# Patient Record
Sex: Female | Born: 1955 | Race: White | Hispanic: No | Marital: Married | Smoking: Never smoker
Health system: Southern US, Community
[De-identification: ages and names within clinical notes are randomized; demographics above are authoritative.]

## PROBLEM LIST (undated history)

## (undated) DIAGNOSIS — E119 Type 2 diabetes mellitus without complications: Secondary | ICD-10-CM

## (undated) DIAGNOSIS — M79651 Pain in right thigh: Secondary | ICD-10-CM

## (undated) DIAGNOSIS — E669 Obesity, unspecified: Secondary | ICD-10-CM

## (undated) DIAGNOSIS — M1710 Unilateral primary osteoarthritis, unspecified knee: Secondary | ICD-10-CM

## (undated) DIAGNOSIS — M79659 Pain in unspecified thigh: Secondary | ICD-10-CM

## (undated) DIAGNOSIS — M25579 Pain in unspecified ankle and joints of unspecified foot: Secondary | ICD-10-CM

## (undated) DIAGNOSIS — G709 Myoneural disorder, unspecified: Secondary | ICD-10-CM

## (undated) DIAGNOSIS — M1712 Unilateral primary osteoarthritis, left knee: Secondary | ICD-10-CM

## (undated) HISTORY — PX: JOINT REPLACEMENT: SHX530

## (undated) HISTORY — DX: Type 2 diabetes mellitus without complications: E11.9

## (undated) HISTORY — DX: Myoneural disorder, unspecified: G70.9

## (undated) HISTORY — PX: TONSILLECTOMY: SUR1361

## (undated) HISTORY — DX: Pain in unspecified thigh: M79.659

## (undated) HISTORY — DX: Obesity, unspecified: E66.9

## (undated) HISTORY — DX: Pain in unspecified ankle and joints of unspecified foot: M25.579

## (undated) HISTORY — PX: COLON SURGERY: SHX602

## (undated) HISTORY — PX: CHOLECYSTECTOMY: SHX55

## (undated) HISTORY — DX: Pain in right thigh: M79.651

## (undated) HISTORY — PX: HERNIA REPAIR: SHX51

---

## 1996-05-04 HISTORY — PX: OTHER SURGICAL HISTORY: SHX169

## 1998-01-04 ENCOUNTER — Other Ambulatory Visit: Admission: RE | Admit: 1998-01-04 | Discharge: 1998-01-04 | Payer: Self-pay | Admitting: Internal Medicine

## 1998-01-10 ENCOUNTER — Encounter: Admission: RE | Admit: 1998-01-10 | Discharge: 1998-04-10 | Payer: Self-pay | Admitting: Internal Medicine

## 1998-01-25 ENCOUNTER — Ambulatory Visit (HOSPITAL_COMMUNITY): Admission: RE | Admit: 1998-01-25 | Discharge: 1998-01-25 | Payer: Self-pay

## 1998-05-04 HISTORY — PX: APPENDECTOMY: SHX54

## 1999-10-21 ENCOUNTER — Ambulatory Visit (HOSPITAL_COMMUNITY): Admission: RE | Admit: 1999-10-21 | Discharge: 1999-10-21 | Payer: Self-pay | Admitting: Internal Medicine

## 1999-10-21 ENCOUNTER — Encounter: Payer: Self-pay | Admitting: Internal Medicine

## 2000-11-08 ENCOUNTER — Encounter: Payer: Self-pay | Admitting: Internal Medicine

## 2000-11-08 ENCOUNTER — Ambulatory Visit (HOSPITAL_COMMUNITY): Admission: RE | Admit: 2000-11-08 | Discharge: 2000-11-08 | Payer: Self-pay

## 2001-12-21 ENCOUNTER — Encounter (INDEPENDENT_AMBULATORY_CARE_PROVIDER_SITE_OTHER): Payer: Self-pay | Admitting: Specialist

## 2001-12-22 ENCOUNTER — Inpatient Hospital Stay (HOSPITAL_COMMUNITY): Admission: EM | Admit: 2001-12-22 | Discharge: 2001-12-24 | Payer: Self-pay | Admitting: Surgery

## 2004-05-04 DIAGNOSIS — E119 Type 2 diabetes mellitus without complications: Secondary | ICD-10-CM

## 2004-05-04 HISTORY — DX: Type 2 diabetes mellitus without complications: E11.9

## 2004-07-21 ENCOUNTER — Ambulatory Visit (HOSPITAL_COMMUNITY): Admission: RE | Admit: 2004-07-21 | Discharge: 2004-07-21 | Payer: Self-pay | Admitting: Internal Medicine

## 2007-09-21 ENCOUNTER — Other Ambulatory Visit: Admission: RE | Admit: 2007-09-21 | Discharge: 2007-09-21 | Payer: Self-pay | Admitting: Family Medicine

## 2007-09-30 ENCOUNTER — Ambulatory Visit (HOSPITAL_COMMUNITY): Admission: RE | Admit: 2007-09-30 | Discharge: 2007-09-30 | Payer: Self-pay | Admitting: Family Medicine

## 2007-10-05 ENCOUNTER — Encounter: Admission: RE | Admit: 2007-10-05 | Discharge: 2007-10-05 | Payer: Self-pay | Admitting: Family Medicine

## 2008-11-20 ENCOUNTER — Ambulatory Visit (HOSPITAL_COMMUNITY): Admission: RE | Admit: 2008-11-20 | Discharge: 2008-11-20 | Payer: Self-pay | Admitting: Family Medicine

## 2009-12-24 ENCOUNTER — Ambulatory Visit (HOSPITAL_COMMUNITY): Admission: RE | Admit: 2009-12-24 | Discharge: 2009-12-24 | Payer: Self-pay | Admitting: Internal Medicine

## 2010-03-18 ENCOUNTER — Ambulatory Visit: Payer: Self-pay | Admitting: Family Medicine

## 2010-03-18 ENCOUNTER — Other Ambulatory Visit: Admission: RE | Admit: 2010-03-18 | Discharge: 2010-03-18 | Payer: Self-pay | Admitting: Family Medicine

## 2010-03-18 DIAGNOSIS — M25579 Pain in unspecified ankle and joints of unspecified foot: Secondary | ICD-10-CM

## 2010-03-18 DIAGNOSIS — J209 Acute bronchitis, unspecified: Secondary | ICD-10-CM

## 2010-03-18 DIAGNOSIS — F3289 Other specified depressive episodes: Secondary | ICD-10-CM | POA: Insufficient documentation

## 2010-03-18 DIAGNOSIS — F329 Major depressive disorder, single episode, unspecified: Secondary | ICD-10-CM

## 2010-03-18 LAB — HM PAP SMEAR

## 2010-03-18 LAB — HM DIABETES FOOT EXAM

## 2010-03-19 ENCOUNTER — Encounter: Payer: Self-pay | Admitting: Family Medicine

## 2010-03-19 LAB — CONVERTED CEMR LAB: Microalb, Ur: 1.37 mg/dL (ref 0.00–1.89)

## 2010-03-20 ENCOUNTER — Ambulatory Visit (HOSPITAL_COMMUNITY): Admission: RE | Admit: 2010-03-20 | Discharge: 2010-03-20 | Payer: Self-pay | Admitting: Family Medicine

## 2010-03-20 DIAGNOSIS — R17 Unspecified jaundice: Secondary | ICD-10-CM | POA: Insufficient documentation

## 2010-03-20 LAB — CONVERTED CEMR LAB
Alkaline Phosphatase: 54 units/L (ref 39–117)
BUN: 10 mg/dL (ref 6–23)
Bilirubin, Direct: 0.4 mg/dL — ABNORMAL HIGH (ref 0.0–0.3)
Cholesterol: 136 mg/dL (ref 0–200)
Creatinine, Ser: 0.72 mg/dL (ref 0.40–1.20)
Eosinophils Absolute: 0.1 10*3/uL (ref 0.0–0.7)
Glucose, Bld: 97 mg/dL (ref 70–99)
HCT: 37.2 % (ref 36.0–46.0)
Hemoglobin: 12.4 g/dL (ref 12.0–15.0)
Hgb A1c MFr Bld: 6.8 % — ABNORMAL HIGH (ref <5.7)
Indirect Bilirubin: 1.5 mg/dL — ABNORMAL HIGH (ref 0.0–0.9)
LDL Cholesterol: 60 mg/dL (ref 0–99)
Lymphs Abs: 2.1 10*3/uL (ref 0.7–4.0)
MCHC: 33.3 g/dL (ref 30.0–36.0)
MCV: 89.9 fL (ref 78.0–100.0)
Monocytes Absolute: 0.4 10*3/uL (ref 0.1–1.0)
Monocytes Relative: 5 % (ref 3–12)
Neutrophils Relative %: 62 % (ref 43–77)
Potassium: 3.7 meq/L (ref 3.5–5.3)
RBC: 4.14 M/uL (ref 3.87–5.11)
TSH: 0.951 microintl units/mL (ref 0.350–4.500)
VLDL: 22 mg/dL (ref 0–40)
Vit D, 25-Hydroxy: 50 ng/mL (ref 30–89)
WBC: 6.9 10*3/uL (ref 4.0–10.5)

## 2010-03-21 ENCOUNTER — Telehealth (INDEPENDENT_AMBULATORY_CARE_PROVIDER_SITE_OTHER): Payer: Self-pay | Admitting: *Deleted

## 2010-03-25 ENCOUNTER — Encounter: Payer: Self-pay | Admitting: Family Medicine

## 2010-03-28 DIAGNOSIS — E1159 Type 2 diabetes mellitus with other circulatory complications: Secondary | ICD-10-CM | POA: Insufficient documentation

## 2010-05-25 ENCOUNTER — Encounter: Payer: Self-pay | Admitting: Internal Medicine

## 2010-05-27 ENCOUNTER — Ambulatory Visit
Admission: RE | Admit: 2010-05-27 | Discharge: 2010-05-27 | Payer: Self-pay | Source: Home / Self Care | Attending: Internal Medicine | Admitting: Internal Medicine

## 2010-06-05 NOTE — Assessment & Plan Note (Signed)
Summary: new pt   Vital Signs:  Patient profile:   55 year old female Height:      68 inches Weight:      246.25 pounds BMI:     37.58 O2 Sat:      98 % on Room air Pulse rate:   66 / minute Pulse rhythm:   regular Resp:     16 per minute BP sitting:   120 / 82  (left arm)  Vitals Entered By: Mauricia Area CMA (March 18, 2010 1:50 PM)  Nutrition Counseling: Patient's BMI is greater than 25 and therefore counseled on weight management options.  O2 Flow:  Room air CC: Pain, burning in ankles  Vision Screening:Left eye with correction: 20 / 20 Right eye with correction: 20 / 20 Both eyes with correction: 20 / 15        Vision Entered By: Mauricia Area CMA (March 18, 2010 2:06 PM)   Primary Care Provider:  Syliva Overman MD  CC:  Pain and burning in ankles.  History of Present Illness: New pt to this  office to establish care. she has concerns about her weight, which she has succesfully reduced in the past several months. She is diabetic and reports good control.S he denies polyuria, polydypsia, blurred vision or hypoglycemic episodes. She recently lost her mother, who was her "best friend' and is having difficulty with this. along with a challenging work environment she reports depression.she is not suicidal or homicidal , but has multiple vegetative symptoms. She also has concern about spider veinsin the legs, but wants to hold on a vascular consult at this time , and intends to lose more weight.   Preventive Screening-Counseling & Management  Alcohol-Tobacco     Smoking Status: never  Caffeine-Diet-Exercise     Does Patient Exercise: no      Drug Use:  no.    Current Medications (verified): 1)  Vytorin 10-20 Mg Tabs (Ezetimibe-Simvastatin) .Marland Kitchen.. 1 Tab Three Times A Week 2)  Vitamin D 2000 Iu .Marland Kitchen.. 1 Tab Daily 3)  Ibuprofen 800 Mg .... As Needed  Allergies (verified): No Known Drug Allergies  Past History:  Past Medical History: type 2 dm since  2006 obesity all her life, lost 28 pounds in 14 weeks bilateral anklle pain x 3 months. bilateral thigh pain, worse in the right , x 52yr, esp when lying down, worse in the morning, and goes away with activity depressed due to loss of mother had a fib had massive stroke , died 4 days after, she was her best friend  Past Surgical History: cholecysectomy approx 1998 appendectomy 2000 tonsillectomy in childhood obesity  Family History: Family History of Colon CA 1st degree relative <60, grandfather in his 10'6 Family History Diabetes 1st degree relative, maternal grandmother Mom deceased at age 45, CVA secondary to afib Father aged 47,HTN, hyperlipidemia, still employed siblling 1 sister, 3 y/o in 2011 , varicose veins  Social History: Married x 35 yrs Never Smoked Alcohol use-no Drug use-no Regular exercise-no Two children ages 45 and 83 Smoking Status:  never Drug Use:  no Does Patient Exercise:  no  Review of Systems      See HPI General:  Complains of fatigue, malaise, and weight loss. Eyes:  Denies blurring, discharge, eye pain, and red eye. ENT:  Denies earache, hoarseness, nasal congestion, sinus pressure, and sore throat. CV:  Denies chest pain or discomfort, difficulty breathing while lying down, palpitations, shortness of breath with exertion, and swelling of feet. Resp:  Complains of cough, shortness of breath, and sputum productive; denies wheezing; 4 day history. GI:  Denies abdominal pain, change in bowel habits, constipation, diarrhea, indigestion, loss of appetite, nausea, and vomiting. GU:  Denies dysuria and urinary frequency. MS:  Denies joint pain, low back pain, mid back pain, muscle aches, muscle weakness, and stiffness. Derm:  Denies itching, lesion(s), and rash. Neuro:  Denies falling down, headaches, memory loss, numbness, poor balance, seizures, sensation of room spinning, tingling, and tremors. Psych:  Complains of anxiety, depression, and easily  tearful; denies mental problems, suicidal thoughts/plans, thoughts of violence, unusual visions or sounds, and thoughts /plans of harming others. Endo:  Denies cold intolerance, excessive thirst, excessive urination, and heat intolerance. Heme:  Denies abnormal bruising, bleeding, and enlarge lymph nodes. Allergy:  Denies hives or rash and itching eyes.  Physical Exam  General:  Well-developed,obese,in no acute distress; alert,appropriate and cooperative throughout examination Head:  Normocephalic and atraumatic without obvious abnormalities. No apparent alopecia or balding. Eyes:  No corneal or conjunctival inflammation noted. EOMI. Perrla. Funduscopic exam benign, without hemorrhages, exudates or papilledema. Vision grossly normal. Ears:  External ear exam shows no significant lesions or deformities.  Otoscopic examination reveals clear canals, tympanic membranes are intact bilaterally without bulging, retraction, inflammation or discharge. Hearing is grossly normal bilaterally. Nose:  External nasal examination shows no deformity or inflammation. Nasal mucosa are pink and moist without lesions or exudates. Mouth:  Oral mucosa and oropharynx without lesions or exudates.  Teeth in good repair. Neck:  No deformities, masses, or tenderness noted. Chest Wall:  No deformities, masses, or tenderness noted. Breasts:  No mass, nodules, thickening, tenderness, bulging, retraction, inflamation, nipple discharge or skin changes noted.   Lungs:  normal respiratory effort.  Adequate air entry, bilateral crackles, no wheezes Heart:  Normal rate and regular rhythm. S1 and S2 normal without gallop, murmur, click, rub or other extra sounds. Abdomen:  Bowel sounds positive,abdomen soft and non-tender without masses, organomegaly or hernias noted.Moderately obese Rectal:  No external abnormalities noted. Normal sphincter tone. No rectal masses or tenderness. Genitalia:  Normal introitus for age, no external  lesions, no vaginal discharge, mucosa pink and moist, no vaginal or cervical lesions, no vaginal atrophy, no friaility or hemorrhage, normal uterus size and position, no adnexal masses or tenderness Msk:  No deformity or scoliosis noted of thoracic or lumbar spine.   Pulses:  R and L carotid,radial,femoral,dorsalis pedis and posterior tibial pulses are full and equal bilaterally Extremities:  No clubbing, cyanosis, edema, or deformity noted with normal full range of motion of all joints.   Neurologic:  No cranial nerve deficits noted. Station and gait are normal. Plantar reflexes are down-going bilaterally. DTRs are symmetrical throughout. Sensory, motor and coordinative functions appear intact. Skin:  Intact without suspicious lesions or rashes Cervical Nodes:  No lymphadenopathy noted Axillary Nodes:  No palpable lymphadenopathy Inguinal Nodes:  No significant adenopathy Psych:  Oriented X3 and memory intact for recent and remote.  tearful and depressed  Diabetes Management Exam:    Foot Exam (with socks and/or shoes not present):       Sensory-Monofilament:          Left foot: normal          Right foot: normal       Inspection:          Left foot: normal          Right foot: normal       Nails:  Left foot: normal          Right foot: normal   Impression & Recommendations:  Problem # 1:  DIABETES MELLITUS, TYPE II (ICD-250.00) Assessment Comment Only Patient advised to reduce carbs and sweets, commit to regular physical activity, take meds as prescribed, test blood sugars as directed, and attempt to lose weight , to improve blood sugar control. 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 Take 1 tablet by  mouth three times a day    Tessalon Perles 100 Mg Caps (Benzonatate) .Marland Kitchen... Take 1 capsule by mouth three times a day  Orders: CXR- 2view (CXR)  Problem # 4:  PHYSICAL EXAMINATION (ICD-V70.0) Assessment: Comment Only  Orders: EKG w/ Interpretation (93000)nSR, no ischemic chnages T-Basic Metabolic Panel [id number]) T-Lipid Profile [id number]) T-CBC w/Diff (84696-29528) T-Hepatic Function (41324-40102) T-TSH (72536-64403)  Problem # 5:  DEPRESSION (ICD-311) Assessment: Deteriorated you are being referred for therapy and will start medication for depression  Complete Medication List: 1)  Vytorin 10-20 Mg Tabs (Ezetimibe-simvastatin) .Marland Kitchen.. 1 tab three times a week 2)  Vitamin D 2000 Iu  .Marland Kitchen.. 1 tab daily 3)  Ibuprofen 800 Mg  .... As needed 4)  Fluoxetine Hcl 10 Mg Caps (Fluoxetine hcl) .... Take 1 capsule by mouth once a day 5)  Penicillin V Potassium 500 Mg Tabs (Penicillin v potassium) .... Take 1 tablet by mouth three times a day 6)  Tessalon Perles 100 Mg Caps (Benzonatate) .... Take 1 capsule by mouth three times a day 7)  Fluconazole 150 Mg Tabs (Fluconazole) .... Take 1 tablet by mouth once a day 8)  Actoplus Met 15-850 Mg Tabs (Pioglitazone hcl-metformin hcl) .... Take 1 tablet by mouth once a day 9)  Vitamin D 200iu  .... One daily  Other Orders: T- Hemoglobin A1C (47425-95638) T-Uric Acid (Blood) (75643-32951) T-FSH (88416-60630) T-Urine Microalbumin w/creat. ratio ([id number]) Psychology Referral (Psychology) Pap Smear (20254) Hemoccult Guaiac-1 spec.(in office) (82270) Tdap => 49yrs IM (27062) Admin 1st Vaccine (37628) T-Vitamin D (25-Hydroxy) (31517-61607)  Patient Instructions: 1)  Please schedule a follow-up appointment in 2 months. 2)  It is important that you exercise regularly at least 20 minutes 5 times a week. If you develop chest pain, have severe difficulty breathing, or feel very tired , stop exercising immediately and seek medical  attention. 3)  You need to lose weight. Consider a lower calorie diet and regular exercise.  4)  medication is sent in for acute bronchitis. 5)  BMP prior to visit, ICD-9: 6)  Hepatic Panel prior to visit, ICD-9: 7)  Lipid Panel prior to visit, ICD-9: 8)  TSH prior to visit, ICD-9: 9)  CBC w/ Diff prior to visit, ICD-9: 10)  HbgA1C prior to visit, ICD-9: 11)  vit D today. 12)  you will get TDap  today. 13)  you need a CXr. 14)  you are referred to therapy and to gI for a colonscopy 15)  Urine Microalbumin prior to visit Prescriptions: FLUCONAZOLE 150 MG TABS (FLUCONAZOLE) Take 1 tablet by mouth once a day  #3 x 0   Entered and Authorized by:   Syliva Overman MD   Signed by:   Syliva Overman MD on 03/18/2010   Method used:   Electronically to  [facility] (retail)       [address]       [place], [place]  [address]       Ph: [phone]       Fax: [phone]   RxID:   [id number] TESSALON PERLES 100 MG CAPS (BENZONATATE) Take 1 capsule by mouth three times a day  #30 x 0   Entered and Authorized by:   Syliva Overman MD   Signed by:   Syliva Overman MD on 03/18/2010   Method used:   Electronically to        [facility] (retail)       [address]       [place], [place]  [address]       Ph: [phone]       Fax: [phone]   RxID:   [id number] PENICILLIN V POTASSIUM 500 MG TABS (PENICILLIN V POTASSIUM) Take 1 tablet by mouth three times a day  #30 x 0   Entered and Authorized by:   Syliva Overman MD   Signed by:   Syliva Overman MD on 03/18/2010   Method used:   Electronically to        [facility] (retail)       [address]       [place], [place]  [address]       Ph: [phone]       Fax: [phone]   RxID:   [id number] FLUOXETINE HCL 10 MG CAPS (FLUOXETINE HCL) Take 1 capsule by mouth once a day  #30 x 3   Entered  and Authorized by:   Syliva Overman MD   Signed by:   Syliva Overman MD on 03/18/2010   Method used:   Electronically to        [facility] (retail)       [address]       [place], [place]  [address]       Ph: [phone]       Fax: [phone]   RxID:   [id number]    Orders Added: 1)  CXR- 2view [CXR] 2)  New Patient 40-64 years [99386] 3)  EKG w/ Interpretation [93000] 4)  T-Basic Metabolic Panel [80048-22910] 5)  T-Lipid Profile [80061-22930] 6)  T-CBC w/Diff [78242-35361] 7)  T-Hepatic Function [80076-22960] 8)  T- Hemoglobin A1C [83036-23375] 9)  T-TSH [44315-40086] 10)  T-Uric Acid (Blood) [84550-23180] 11)  T-FSH [83001-23670] 12)  T-Urine Microalbumin w/creat. ratio [82043-[address]] 13)  Psychology Referral [Psychology] 14)  Pap Smear [88150] 15)  Hemoccult Guaiac-1 spec.(in office) [82270] 16)  Tdap => 22yrs IM [90715] 17)  Admin 1st Vaccine [90471] 18)  T-Vitamin D (25-Hydroxy) [76195-09326] 19)  Gastroenterology Referral [GI] 20)  Gastroenterology Referral [GI]   Immunizations Administered:  Tetanus Vaccine:    Vaccine Type: Tdap    Site: right deltoid    Mfr: GlaxoSmithKline    Dose: 0.5 ml    Route: IM    Given by: Adella Hare LPN    Exp. Date: 02/21/2012    Lot #: [id number]    Immunizations Administered:  Tetanus Vaccine:    Vaccine Type: Tdap    Site: right deltoid    Mfr: GlaxoSmithKline    Dose: 0.5 ml    Route: IM    Given by: Adella Hare LPN  Exp. Date: 02/21/2012    Lot #: [id number]   Laboratory Results  Date/Time Received: March 18, 2010 3:17 PM  Date/Time Reported: March 18, 2010 3:17 PM   Stool - Occult Blood Hemmoccult #1: positive Date: 03/18/2010 Comments: 50201 10L 02/13 118/10/12 Adella Hare LPN  March 18, 2010 3:19 PM

## 2010-06-05 NOTE — Progress Notes (Signed)
Summary: APPT WITH [facility]  Phone Note Call from Patient   Summary of Call: HAS APPT WITH DR [facility] 12.27.11 @ 3:00  PATIENT IS AWARE Initial call taken by: Lind Guest,  March 21, 2010 9:52 AM

## 2010-06-05 NOTE — Letter (Signed)
[facility]   Imported By: Lind Guest 03/19/2010 07:52:08  _____________________________________________________________________  External Attachment:    Type:   Image     Comment:   External Document

## 2010-06-05 NOTE — Letter (Signed)
Summary: Pap Smear, Normal Letter, [facility]  [address]   [place], [place] [address]   Phone: [phone]  Fax: [phone]          March 25, 2010    Dear: Kelly Savage [facility]    I am pleased to notify you that your PAP smear was normal.  You will need your next PAP smear in:     ____ 3 Months    ____ 6 Months    ____ 12 Months    Please call the office at our office number above, to schedule your next appointment.    Sincerely,    Adella Hare, LPN [facility]

## 2010-06-09 ENCOUNTER — Ambulatory Visit: Payer: Self-pay | Admitting: Family Medicine

## 2010-06-25 ENCOUNTER — Ambulatory Visit: Payer: Self-pay | Admitting: Family Medicine

## 2010-07-16 ENCOUNTER — Other Ambulatory Visit (INDEPENDENT_AMBULATORY_CARE_PROVIDER_SITE_OTHER): Payer: Self-pay | Admitting: Internal Medicine

## 2010-07-16 ENCOUNTER — Encounter (HOSPITAL_BASED_OUTPATIENT_CLINIC_OR_DEPARTMENT_OTHER): Payer: BC Managed Care – PPO | Admitting: Internal Medicine

## 2010-07-16 ENCOUNTER — Ambulatory Visit (HOSPITAL_COMMUNITY)
Admission: RE | Admit: 2010-07-16 | Discharge: 2010-07-16 | Disposition: A | Discharge status: Home / Self Care | Payer: BC Managed Care – PPO | Source: Ambulatory Visit | Attending: Internal Medicine | Admitting: Internal Medicine

## 2010-07-16 DIAGNOSIS — K6389 Other specified diseases of intestine: Secondary | ICD-10-CM

## 2010-07-16 DIAGNOSIS — K921 Melena: Secondary | ICD-10-CM | POA: Insufficient documentation

## 2010-07-16 DIAGNOSIS — Z7982 Long term (current) use of aspirin: Secondary | ICD-10-CM | POA: Insufficient documentation

## 2010-07-16 DIAGNOSIS — R198 Other specified symptoms and signs involving the digestive system and abdomen: Secondary | ICD-10-CM

## 2010-07-16 DIAGNOSIS — E119 Type 2 diabetes mellitus without complications: Secondary | ICD-10-CM | POA: Insufficient documentation

## 2010-07-16 DIAGNOSIS — K5730 Diverticulosis of large intestine without perforation or abscess without bleeding: Secondary | ICD-10-CM

## 2010-07-16 DIAGNOSIS — R195 Other fecal abnormalities: Secondary | ICD-10-CM

## 2010-07-21 ENCOUNTER — Encounter: Payer: Self-pay | Admitting: Family Medicine

## 2010-07-22 ENCOUNTER — Encounter: Payer: Self-pay | Admitting: Family Medicine

## 2010-07-22 ENCOUNTER — Ambulatory Visit (INDEPENDENT_AMBULATORY_CARE_PROVIDER_SITE_OTHER): Payer: BC Managed Care – PPO | Admitting: Family Medicine

## 2010-07-22 DIAGNOSIS — E785 Hyperlipidemia, unspecified: Secondary | ICD-10-CM

## 2010-07-22 DIAGNOSIS — R5381 Other malaise: Secondary | ICD-10-CM

## 2010-07-22 DIAGNOSIS — R7301 Impaired fasting glucose: Secondary | ICD-10-CM

## 2010-07-22 DIAGNOSIS — M899 Disorder of bone, unspecified: Secondary | ICD-10-CM

## 2010-07-22 DIAGNOSIS — R5383 Other fatigue: Secondary | ICD-10-CM

## 2010-07-22 DIAGNOSIS — E669 Obesity, unspecified: Secondary | ICD-10-CM

## 2010-07-22 DIAGNOSIS — M25569 Pain in unspecified knee: Secondary | ICD-10-CM

## 2010-07-22 DIAGNOSIS — M949 Disorder of cartilage, unspecified: Secondary | ICD-10-CM

## 2010-07-22 DIAGNOSIS — Z13220 Encounter for screening for lipoid disorders: Secondary | ICD-10-CM

## 2010-07-22 MED ORDER — HYDROCODONE-ACETAMINOPHEN 5-500 MG PO TABS: 1.0000 | ORAL_TABLET | Freq: Four times a day (QID) | ORAL | Status: AC | PRN

## 2010-07-22 MED ORDER — PIOGLITAZONE HCL-METFORMIN HCL 15-850 MG PO TABS: 1.0000 | ORAL_TABLET | Freq: Every day | ORAL | Status: DC

## 2010-07-22 NOTE — Patient Instructions (Addendum)
F/u in 4 months.  Med changes as discussed.  Fasting lipid, hepatic and chem 7 and hBA1c and Vit d as soon as possible, also same labs to be repeated , except vit d in 4 months

## 2010-07-25 ENCOUNTER — Encounter: Payer: Self-pay | Admitting: Family Medicine

## 2010-07-25 DIAGNOSIS — E66811 Obesity, class 1: Secondary | ICD-10-CM | POA: Insufficient documentation

## 2010-07-25 DIAGNOSIS — E669 Obesity, unspecified: Secondary | ICD-10-CM | POA: Insufficient documentation

## 2010-07-25 DIAGNOSIS — M170 Bilateral primary osteoarthritis of knee: Secondary | ICD-10-CM | POA: Insufficient documentation

## 2010-07-25 NOTE — Progress Notes (Signed)
  Subjective:    Patient ID: Kelly Savage, female    DOB: Aug 18, 1955, 55 y.o.   MRN: [id number]  HPI Patient reports that she is doing  well Denies recent fever or chills. Denies sinus pressure, nasal congestion, ear pain or sore throat. Denies chest congestion, productive cough or wheezing. Denies chest pains, palpitations, paroxysmal nocturnal dyspnea, orthopnea and leg swelling Denies abdominal pain, nausea, vomiting,diarrhea or constipation.  Denies rectal bleeding or change in bowel movement. Denies dysuria, frequency, hesitancy or incontinence. Denies joint pain, swelling and limitation and mobility. Denies headaches, seizure, numbness, or tingling. Denies depression, anxiety or insomnia. Denies skin break down or rash. Blood sugars when tested are seldom over 120, and she denies polyuria, excessive thirst or  blurred vision. Since her visit , she has had a colonoscopy, which was normal, and she reports excellent repsponse to the fluoxetine , which she plans to continue. She does want to be more diligent with diet and exercise.        Review of Systems     Objective:   Physical Exam  Constitutional: She is oriented to person, place, and time. She appears well-developed and well-nourished.  HENT:  Head: Normocephalic.  Right Ear: External ear normal.  Left Ear: External ear normal.  Nose: Nose normal.  Mouth/Throat: Oropharynx is clear and moist.  Eyes: Conjunctivae and EOM are normal. Right eye exhibits no discharge. Left eye exhibits no discharge. No scleral icterus.  Neck: Normal range of motion. Neck supple. No JVD present. No tracheal deviation present. No thyromegaly present.  Cardiovascular: Normal rate, regular rhythm and intact distal pulses.   No murmur heard. Pulmonary/Chest: Effort normal and breath sounds normal. No respiratory distress. She has no wheezes. She has no rales.  Abdominal: Soft. Bowel sounds are normal. There is no tenderness.    Musculoskeletal: Normal range of motion. She exhibits no edema.  Lymphadenopathy:    She has no cervical adenopathy.  Neurological: She is alert and oriented to person, place, and time. No cranial nerve deficit. Coordination normal.  Skin: Skin is warm. No rash noted. No erythema.  Psychiatric: She has a normal mood and affect. Her behavior is normal. Judgment and thought content normal.          Assessment & Plan:  Diabetes: Improved , no med changes. Low carb diet rich in vegetables discussed, also the value of regular exercise. Labs due , and prior to next OV  Obesity: unchanged , increased activity encouraged.  Hyperlipidemia: low fat diet discussed, LDL goal of 70 addressed, no med change.  Arthritis knees: unchanged, Vicodin as needed in limited supply

## 2010-08-01 ENCOUNTER — Other Ambulatory Visit: Payer: Self-pay | Admitting: Family Medicine

## 2010-08-01 LAB — BASIC METABOLIC PANEL
BUN: 21 mg/dL (ref 6–23)
CO2: 28 mEq/L (ref 19–32)
Chloride: 102 mEq/L (ref 96–112)
Glucose, Bld: 104 mg/dL — ABNORMAL HIGH (ref 70–99)
Potassium: 4.3 mEq/L (ref 3.5–5.3)

## 2010-08-01 LAB — HEPATIC FUNCTION PANEL
Bilirubin, Direct: 0.2 mg/dL (ref 0.0–0.3)
Indirect Bilirubin: 1 mg/dL — ABNORMAL HIGH (ref 0.0–0.9)
Total Bilirubin: 1.2 mg/dL (ref 0.3–1.2)
Total Protein: 6.6 g/dL (ref 6.0–8.3)

## 2010-08-01 LAB — LIPID PANEL
LDL Cholesterol: 97 mg/dL (ref 0–99)
VLDL: 13 mg/dL (ref 0–40)

## 2010-08-01 LAB — HEMOGLOBIN A1C: Mean Plasma Glucose: 134 mg/dL — ABNORMAL HIGH (ref <117)

## 2010-08-02 LAB — VITAMIN D 25 HYDROXY (VIT D DEFICIENCY, FRACTURES): Vit D, 25-Hydroxy: 35 ng/mL (ref 30–89)

## 2010-08-04 ENCOUNTER — Telehealth: Payer: Self-pay | Admitting: *Deleted

## 2010-08-04 NOTE — Telephone Encounter (Signed)
Message copied by Adella Hare on Mon Aug 04, 2010  9:12 AM ------      Message from: Syliva Overman      Created: Sun Aug 03, 2010 12:58 PM       pls advise blood sugar, vit D liver and kidney tests are all good.LlDL is too high, increase the vytorin to 5 days per week      Will need rept lipid and hepatic added to next labs if not already there pls, let her know fasting next lab draw

## 2010-08-05 NOTE — Op Note (Signed)
  NAMEJAMILET, Kelly Savage                 ACCOUNT NO.:  [id number]  MEDICAL RECORD NO.:  [id number]           PATIENT TYPE:  O  LOCATION:  [facility]                          FACILITY:  [facility]  PHYSICIAN:  Lionel December, M.D.    DATE OF BIRTH:  [birth year]  DATE OF PROCEDURE:  07/16/2010 DATE OF DISCHARGE:                              OPERATIVE REPORT   PROCEDURE:  Colonoscopy with terminal ileoscopy.  INDICATION:  Kelly Savage is 55 year old Caucasian female who was noted to have heme-positive stools.  She does not have chronic diarrhea, frank bleeding.  Family history is negative for colorectal carcinoma. Procedure risks were reviewed with the patient and informed consent was obtained.  MEDICATIONS FOR CONSCIOUS SEDATION:  Demerol 50 mg IV, Versed 8 mg IV.  FINDINGS:  Procedure performed in endoscopy suite.  The patient's vital signs and O2 sat were monitored during the procedure and remained stable.  The patient was placed in the left lateral recumbent position and rectal examination was performed.  No abnormality noted on external or digital exam.  Pentax videoscope was placed in the rectum and advanced under vision into sigmoid colon and beyond.  Preparation was excellent.  She had few scattered diverticula in sigmoid and descending colon.  Somewhat redundant hepatic flexure.  The scope was finally passed into cecum which was identified by appendiceal stump and ileocecal valve.  Short segment GI was also examined and was normal. There were 3 small erosions at cecum and a couple more distally to it, in the region of hepatic flexure, one at splenic flexure.  Biopsy was taken from 2 cecal erosions, other one could not be found on the way out.  No polyps and/or tumor masses were noted.  Rectal mucosa was normal.  Scope was retroflexed to examine anorectal junction and hemorrhoids noted below the dentate line along with thickening to anoderm.  Endoscope was straightened and withdrawn.  Withdrawal  time was 11 minutes.  The patient tolerated the procedure well.  FINAL DIAGNOSIS:  Normal terminal ileum.  Few diverticula at sigmoid and descending colon.  Few scattered erosions.  Biopsy was taken from cecum.  I suspect these erosions may be secondary to ASA or ibuprofen that she uses sporadically.  RECOMMENDATIONS:  Standard instructions given.  High-fiber diet.  I will be contacting the patient with results of biopsy and further recommendations if any.     Lionel December, M.D.     NR/MEDQ  D:  07/16/2010  T:  07/16/2010  Job:  [id number]  cc:   Milus Mallick. Lodema Hong, M.D. Fax: [phone]  Electronically Signed by Lionel December M.D. on 08/05/2010 09:43:25 AM

## 2010-09-19 NOTE — Op Note (Signed)
Kelly Savage, Kelly Savage                          ACCOUNT NO.:  [id number]   MEDICAL RECORD NO.:  [id number]                   PATIENT TYPE:  EMS   LOCATION:  ED                                   FACILITY:  [facility]   PHYSICIAN:  Currie Paris, M.D.           DATE OF BIRTH:  1955/09/04   DATE OF PROCEDURE:  12/21/2001  DATE OF DISCHARGE:                                 OPERATIVE REPORT   PREOPERATIVE DIAGNOSIS:  Retrocecal appendicitis.   POSTOPERATIVE DIAGNOSIS:  Acute perforated retrocecal appendicitis.   OPERATION:  Laparoscopic and open appendectomy.   SURGEON:  Currie Paris, M.D.   ANESTHESIA:  General endotracheal.   CLINICAL HISTORY:  This patient is a 55 year old woman, who has presented  with about a 12 hour history of abdominal pain.  It began diffusely and  localized into her right lower quadrant with associated nausea, vomiting,  diarrhea.  She had right lower quadrant tenderness both direct and rebound.  White count was 15,000, and CT scan showed a very enlarged appendix with a  fair amount of inflammation around it but no clear cut abscess.  It appeared  to be somewhat retrocecal or laterally located, and that was consistent with  the level and location of her pain.   DESCRIPTION OF PROCEDURE:  The patient was seen in the holding area again,  and she had no further questions.  She was taken to the operating room and  after satisfactory general endotracheal anesthesia had been obtained, a  Foley catheter was placed and the abdomen prepped and draped.  I used 0.25%  Marcaine for the laparoscopic incisions and made an umbilical incision and  opened the fascia and entered the peritoneal cavity.  Hasson was introduced  and the abdomen insufflated to 15.  I could not really visualize the right  lower quadrant yet, so I put a trocar in the right upper quadrant and a 10-  11 in the left lower quadrant, both under direct vision.  At this point, I  was able to see  what appeared to be the very tip of the appendix which  appeared to be necrotic.  That was the only portion of the appendix that was  visible and after a lot of effort and using some blunt and sharp dissection  and a harmonic scalpel to try to free up the retroperitoneum laterally to  get this mobilized out, I abandoned the laparoscopic approach, thinking that  this was not going to work because things were too stuck and the  inflammatory process too involved to successfully do laparoscopically.  Those trocars were removed.  The patient's right lower quadrant was opened  with a transverse incision down through the fascia, the muscle layers split,  and the peritoneal cavity entered.  With retractors in place, I was then  able to mobilize the appendix up, and it actually shredded as I tried to  track up  on it, and the distal end came off.  I was then able to mobilize  about two-thirds of the distal appendix back towards the base and then  finally, this gave me enough mobility of the cecum to get it out into the  wound.  The base of the appendix actually then was visualized and looked  pretty normal.  I was able to get around that, divide it, ligate the  proximal end with an 0 chromic and gird it with a pursestring.  With the  appendix thus divided, I was then able to finally divide the remaining  mesoappendix, and most of this was done with a harmonic scalpel, and the  mesoappendix was very thick and edematous and inflammatory and clearly  appeared to have been perforated but fairly well sealed off appendicitis.  Eventually, I got the entire appendix out and then irrigated and made sure  everything was dry, and it did appear okay.  The cecum appeared okay.  The  terminal ileum had been stuck to the mesoappendix, and I have checked that  to make sure that no injury had occurred there, and that also looked okay.   After another irrigation, I closed using a running PDS on the peritoneum and   running #1 PDS on the fascia.  Subcu was irrigated and the skin closed with  staples with some Telfa wicks placed.  The patient tolerated the procedure  well, and there were no operative complications.  All counts were correct.                                               Currie Paris, M.D.    CJS/MEDQ  D:  12/21/2001  T:  12/22/2001  Job:  [id number]

## 2010-10-01 ENCOUNTER — Other Ambulatory Visit: Payer: Self-pay | Admitting: Family Medicine

## 2010-12-30 ENCOUNTER — Encounter: Payer: Self-pay | Admitting: Family Medicine

## 2010-12-31 ENCOUNTER — Ambulatory Visit: Payer: BC Managed Care – PPO | Admitting: Family Medicine

## 2011-01-07 ENCOUNTER — Encounter: Payer: Self-pay | Admitting: Family Medicine

## 2011-01-08 ENCOUNTER — Encounter: Payer: Self-pay | Admitting: Family Medicine

## 2011-01-08 ENCOUNTER — Ambulatory Visit (INDEPENDENT_AMBULATORY_CARE_PROVIDER_SITE_OTHER): Payer: BC Managed Care – PPO | Admitting: Family Medicine

## 2011-01-08 VITALS — BP 110/78 | HR 72 | Resp 16 | Ht 69.0 in | Wt 257.0 lb

## 2011-01-08 DIAGNOSIS — M129 Arthropathy, unspecified: Secondary | ICD-10-CM

## 2011-01-08 DIAGNOSIS — F329 Major depressive disorder, single episode, unspecified: Secondary | ICD-10-CM

## 2011-01-08 DIAGNOSIS — E669 Obesity, unspecified: Secondary | ICD-10-CM

## 2011-01-08 DIAGNOSIS — Z23 Encounter for immunization: Secondary | ICD-10-CM

## 2011-01-08 DIAGNOSIS — M25569 Pain in unspecified knee: Secondary | ICD-10-CM

## 2011-01-08 DIAGNOSIS — R5381 Other malaise: Secondary | ICD-10-CM

## 2011-01-08 DIAGNOSIS — E119 Type 2 diabetes mellitus without complications: Secondary | ICD-10-CM

## 2011-01-08 DIAGNOSIS — M1990 Unspecified osteoarthritis, unspecified site: Secondary | ICD-10-CM

## 2011-01-08 DIAGNOSIS — R5383 Other fatigue: Secondary | ICD-10-CM

## 2011-01-08 MED ORDER — METHYLPREDNISOLONE ACETATE 80 MG/ML IJ SUSP
80.0000 mg | Freq: Once | INTRAMUSCULAR | Status: AC
  Administered 2011-01-08: 80 mg via INTRAMUSCULAR

## 2011-01-08 MED ORDER — HYDROCODONE-ACETAMINOPHEN 5-500 MG PO TABS: ORAL_TABLET | ORAL | Status: AC

## 2011-01-08 MED ORDER — PREDNISONE (PAK) 5 MG PO TABS: 5.0000 mg | ORAL_TABLET | ORAL | Status: DC

## 2011-01-08 MED ORDER — INFLUENZA VAC TYPES A & B PF IM SUSP: 0.5000 mL | Freq: Once | INTRAMUSCULAR | Status: DC

## 2011-01-08 NOTE — Assessment & Plan Note (Signed)
Deteriorated. Patient re-educated about  the importance of commitment to a  minimum of 150 minutes of exercise per week. The importance of healthy food choices with portion control discussed. Encouraged to start a food diary, count calories and to consider  joining a support group. Sample diet sheets offered. Goals set by the patient for the next several months.    

## 2011-01-08 NOTE — Patient Instructions (Addendum)
CPE in January.  Fasting lipid , hepatic , chem 7, HBA1C asap,    Hba1C and Chem7, TSH and CBC non fasting in January   Microalb to be submitted 11/20 or after.  Injection and med for arthritic pain  TdAp and pneumonia vaccine today.  It is important that you exercise regularly at least 30 minutes 5 times a week. If you develop chest pain, have severe difficulty breathing, or feel very tired, stop exercising immediately and seek medical attention '  A healthy diet is rich in fruit, vegetables and whole grains. Poultry fish, nuts and beans are a healthy choice for protein rather then red meat. A low sodium diet and drinking 64 ounces of water daily is generally recommended. Oils and sweet should be limited. Carbohydrates especially for those who are diabetic or overweight, should be limited to 30-45 gram per meal. It is important to eat on a regular schedule, at least 3 times daily. Snacks should be primarily fruits, vegetables or nuts.  Weight loss goal of 10 to 12 pounds

## 2011-01-08 NOTE — Assessment & Plan Note (Signed)
Resolved, pt no longer on medicaation and doing well

## 2011-01-08 NOTE — Assessment & Plan Note (Signed)
Deteriorated, encouraged strongly to work on weight loss and increased activity

## 2011-01-08 NOTE — Progress Notes (Signed)
  Subjective:    Patient ID: Kelly Savage, female    DOB: 1956/03/11, 55 y.o.   MRN: [id number]  HPI The PT is here for follow up and re-evaluation of chronic medical conditions, medication management and review of any available recent lab and radiology data.  Preventive health is updated, specifically  Cancer screening and Immunization.   Questions or concerns regarding consultations or procedures which the PT has had in the interim are  addressed. The PT denies any adverse reactions to current medications since the last visit.  C/o increased right leg pain and stiffness in knees, ankles and hips when she initially starts moving. She has gained 10 pounds in the past several months realizes this is a problem and intends to change her lifestyle to improve this. She is requesting vicodin , which she uses only as needed for severe pain. She has stopped vytorin for approx 2 months, wondering if this would help the leg pain. No change noted. Denies pain with activity which resolves with rest. Denies calf swelling or pain. Concerned about varicose veins. She no longer takes prozac, has managed to wean herself off prozac, 2 years after her Mom's passing, and she has great things going on in the life of her children . Still gets tearful at times, but "better" Plans to get eye exam later this year after paying off colonscopy      Review of Systems .See HPI Denies recent fever or chills. Denies sinus pressure, nasal congestion, ear pain or sore throat. Denies chest congestion, productive cough or wheezing. Denies chest pains, palpitations and leg swelling Denies abdominal pain, nausea, vomiting,diarrhea or constipation.   Denies dysuria, frequency, hesitancy or incontinence.  Denies headaches, seizures, numbness, or tingling. Denies depression, anxiety or insomnia. Denies skin break down or rash.        Objective:   Physical Exam  Patient alert and oriented and in no cardiopulmonary  distress.  HEENT: No facial asymmetry, EOMI, no sinus tenderness,  oropharynx pink and moist.  Neck supple no adenopathy.  Chest: Clear to auscultation bilaterally.  CVS: S1, S2 no murmurs, no S3.Dorsalis pedis normal in both feet  ABD: Soft non tender. Bowel sounds normal.  Ext: No edema  MS: Adequate ROM spine, shoulders, hips and knees.Negative homan's sign  Skin: Intact, no ulcerations or rash noted.  Psych: Good eye contact, normal affect. Memory intact not anxious or depressed appearing.  CNS: CN 2-12 intact, power, tone and sensation normal throughout. Diabetic Foot Check:  Appearance - no lesions, ulcers or calluses Skin - no unusual pallor or redness Sensation - grossly intact to light touch Monofilament testing -  Right - Great toe, medial, central, lateral ball and posterior foot intact Left - Great toe, medial, central, lateral ball and posterior foot intact Pulses Left - Dorsalis Pedis and Posterior Tibia normal Right - Dorsalis Pedis and Posterior Tibia normal       Assessment & Plan:

## 2011-01-20 LAB — HEPATIC FUNCTION PANEL
AST: 14 U/L (ref 0–37)
Albumin: 4 g/dL (ref 3.5–5.2)
Alkaline Phosphatase: 61 U/L (ref 39–117)
Total Bilirubin: 1 mg/dL (ref 0.3–1.2)

## 2011-01-20 LAB — LIPID PANEL
HDL: 67 mg/dL (ref >39)
Triglycerides: 92 mg/dL (ref <150)

## 2011-01-20 LAB — BASIC METABOLIC PANEL
CO2: 29 mEq/L (ref 19–32)
Calcium: 9 mg/dL (ref 8.4–10.5)
Creat: 0.78 mg/dL (ref 0.50–1.10)

## 2011-01-21 ENCOUNTER — Telehealth: Payer: Self-pay | Admitting: *Deleted

## 2011-01-21 NOTE — Telephone Encounter (Signed)
Called patient, left message.

## 2011-01-21 NOTE — Telephone Encounter (Signed)
Message copied by Diamantina Monks on Wed Jan 21, 2011  9:03 AM ------      Message from: Syliva Overman MD E      Created: Wed Jan 21, 2011  6:31 AM       pls advise of inc in HBA1C , not as good control, encourage her to attend grp or individual session to review eating habits , and she needs to work at this and attempt to lose on avg 2 ppounds each month, just from changing her eating habits

## 2011-01-23 ENCOUNTER — Telehealth: Payer: Self-pay | Admitting: *Deleted

## 2011-01-23 NOTE — Telephone Encounter (Signed)
Message copied by Diamantina Monks on Fri Jan 23, 2011  2:43 PM ------      Message from: Syliva Overman MD E      Created: Wed Jan 21, 2011  6:31 AM       pls advise of inc in HBA1C , not as good control, encourage her to attend grp or individual session to review eating habits , and she needs to work at this and attempt to lose on avg 2 ppounds each month, just from changing her eating habits

## 2011-01-23 NOTE — Telephone Encounter (Signed)
Called patient, left message.

## 2011-01-26 ENCOUNTER — Other Ambulatory Visit: Payer: Self-pay | Admitting: Family Medicine

## 2011-01-26 DIAGNOSIS — Z1231 Encounter for screening mammogram for malignant neoplasm of breast: Secondary | ICD-10-CM

## 2011-01-28 ENCOUNTER — Telehealth: Payer: Self-pay | Admitting: *Deleted

## 2011-01-28 NOTE — Telephone Encounter (Signed)
Message copied by Diamantina Monks on Wed Jan 28, 2011 10:13 AM ------      Message from: Syliva Overman MD E      Created: Wed Jan 21, 2011  6:31 AM       pls advise of inc in HBA1C , not as good control, encourage her to attend grp or individual session to review eating habits , and she needs to work at this and attempt to lose on avg 2 ppounds each month, just from changing her eating habits

## 2011-01-28 NOTE — Telephone Encounter (Signed)
PATIENT AWARE OF LAB RESULTS °

## 2011-01-29 NOTE — Telephone Encounter (Signed)
Patient aware.

## 2011-02-03 ENCOUNTER — Ambulatory Visit (HOSPITAL_COMMUNITY)
Admission: RE | Admit: 2011-02-03 | Discharge: 2011-02-03 | Disposition: A | Discharge status: Home / Self Care | Payer: BC Managed Care – PPO | Source: Ambulatory Visit | Attending: Family Medicine | Admitting: Family Medicine

## 2011-02-03 DIAGNOSIS — Z1231 Encounter for screening mammogram for malignant neoplasm of breast: Secondary | ICD-10-CM | POA: Insufficient documentation

## 2011-02-05 ENCOUNTER — Encounter (INDEPENDENT_AMBULATORY_CARE_PROVIDER_SITE_OTHER): Payer: Self-pay | Admitting: *Deleted

## 2011-02-05 NOTE — Telephone Encounter (Signed)
Open in error  This encounter was created in error - please disregard.

## 2011-04-30 ENCOUNTER — Other Ambulatory Visit: Payer: Self-pay | Admitting: Family Medicine

## 2011-05-04 ENCOUNTER — Encounter: Payer: Self-pay | Admitting: Family Medicine

## 2011-05-19 ENCOUNTER — Encounter: Payer: BC Managed Care – PPO | Admitting: Family Medicine

## 2011-05-23 LAB — CBC WITH DIFFERENTIAL/PLATELET
Basophils Absolute: 0 K/uL (ref 0.0–0.1)
Basophils Relative: 1 % (ref 0–1)
Eosinophils Absolute: 0.1 K/uL (ref 0.0–0.7)
Eosinophils Relative: 2 % (ref 0–5)
HCT: 40.1 % (ref 36.0–46.0)
Hemoglobin: 13.4 g/dL (ref 12.0–15.0)
Lymphocytes Relative: 29 % (ref 12–46)
Lymphs Abs: 1.4 K/uL (ref 0.7–4.0)
MCH: 30 pg (ref 26.0–34.0)
MCHC: 33.4 g/dL (ref 30.0–36.0)
MCV: 89.7 fL (ref 78.0–100.0)
Monocytes Absolute: 0.3 K/uL (ref 0.1–1.0)
Monocytes Relative: 5 % (ref 3–12)
Neutro Abs: 3 K/uL (ref 1.7–7.7)
Neutrophils Relative %: 63 % (ref 43–77)
Platelets: 225 K/uL (ref 150–400)
RBC: 4.47 MIL/uL (ref 3.87–5.11)
RDW: 13.1 % (ref 11.5–15.5)
WBC: 4.7 K/uL (ref 4.0–10.5)

## 2011-05-23 LAB — BASIC METABOLIC PANEL WITH GFR
BUN: 14 mg/dL (ref 6–23)
CO2: 28 meq/L (ref 19–32)
Calcium: 9.2 mg/dL (ref 8.4–10.5)
Chloride: 104 meq/L (ref 96–112)
Creat: 0.78 mg/dL (ref 0.50–1.10)
Glucose, Bld: 115 mg/dL — ABNORMAL HIGH (ref 70–99)
Potassium: 4.4 meq/L (ref 3.5–5.3)
Sodium: 140 meq/L (ref 135–145)

## 2011-05-24 LAB — HEMOGLOBIN A1C: Hgb A1c MFr Bld: 6.7 % — ABNORMAL HIGH (ref <5.7)

## 2011-05-27 ENCOUNTER — Encounter: Payer: Self-pay | Admitting: Family Medicine

## 2011-05-28 ENCOUNTER — Other Ambulatory Visit (HOSPITAL_COMMUNITY)
Admission: RE | Admit: 2011-05-28 | Discharge: 2011-05-28 | Disposition: A | Discharge status: Home / Self Care | Payer: 59 | Source: Ambulatory Visit | Attending: Family Medicine | Admitting: Family Medicine

## 2011-05-28 ENCOUNTER — Encounter: Payer: Self-pay | Admitting: Family Medicine

## 2011-05-28 ENCOUNTER — Ambulatory Visit (INDEPENDENT_AMBULATORY_CARE_PROVIDER_SITE_OTHER): Payer: Commercial Managed Care - PPO | Admitting: Family Medicine

## 2011-05-28 VITALS — BP 130/74 | HR 78 | Resp 18 | Ht 69.0 in | Wt 265.1 lb

## 2011-05-28 DIAGNOSIS — Z0000 Encounter for general adult medical examination without abnormal findings: Secondary | ICD-10-CM

## 2011-05-28 DIAGNOSIS — M25569 Pain in unspecified knee: Secondary | ICD-10-CM

## 2011-05-28 DIAGNOSIS — E119 Type 2 diabetes mellitus without complications: Secondary | ICD-10-CM

## 2011-05-28 DIAGNOSIS — E785 Hyperlipidemia, unspecified: Secondary | ICD-10-CM

## 2011-05-28 DIAGNOSIS — E669 Obesity, unspecified: Secondary | ICD-10-CM

## 2011-05-28 DIAGNOSIS — Z01419 Encounter for gynecological examination (general) (routine) without abnormal findings: Secondary | ICD-10-CM | POA: Insufficient documentation

## 2011-05-28 MED ORDER — SITAGLIPTIN PHOS-METFORMIN HCL 50-1000 MG PO TABS: ORAL_TABLET | ORAL | Status: DC

## 2011-05-28 MED ORDER — CELECOXIB 100 MG PO CAPS: ORAL_CAPSULE | ORAL | Status: DC

## 2011-05-28 MED ORDER — PRAVASTATIN SODIUM 40 MG PO TABS: 40.0000 mg | ORAL_TABLET | Freq: Every evening | ORAL | Status: DC

## 2011-05-28 NOTE — Assessment & Plan Note (Signed)
Increased stiffness, celebrex as needed

## 2011-05-28 NOTE — Patient Instructions (Addendum)
F/U in 4 month  Fasting labs before next visit.  Three new medications, janumet, pravstatin and celebrex.  Take celebrex once daily as needed for pain and stiffness, for the first 3 days ok to take one twice daily. Please call if stiffness is disabling injections into the joint by orthopedics may help alot  Stop actoplusmet and vytorin once you have janumet and pravastatin please.  Log into "My fittnes pal", an excellent app on the computer, which calculates the calories you eat, alos helps to account for water, exercise, carbs etc

## 2011-05-29 LAB — MICROALBUMIN / CREATININE URINE RATIO
Creatinine, Urine: 90 mg/dL
Microalb Creat Ratio: 5.6 mg/g (ref 0.0–30.0)
Microalb, Ur: 0.5 mg/dL (ref 0.00–1.89)

## 2011-05-30 NOTE — Assessment & Plan Note (Signed)
Deteriorated. Patient re-educated about  the importance of commitment to a  minimum of 150 minutes of exercise per week. The importance of healthy food choices with portion control discussed. Encouraged to start a food diary, count calories and to consider  joining a support group. Sample diet sheets offered. Goals set by the patient for the next several months.    

## 2011-05-30 NOTE — Assessment & Plan Note (Signed)
Controlled when checked, for savings will change to janumet

## 2011-05-30 NOTE — Progress Notes (Signed)
  Subjective:    Patient ID: Kelly Savage, female    DOB: 06/17/1955, 56 y.o.   MRN: [id number]  HPI The PT is here for annual exam   and re-evaluation of chronic medical conditions, medication management and review of any available recent lab and radiology data.  Preventive health is updated, specifically  Cancer screening and Immunization.   Questions or concerns regarding consultations or procedures which the PT has had in the interim are  addressed. The PT denies any adverse reactions to current medications since the last visit.  C/o increased stiffness of the knees with pain and swelling. Denies instability or falls. C/o weight gain, has been under increased stress recently Denies polyuria, polydipsia, blurred vision or hypoglycemic episodes      Review of Systems See HPI Denies recent fever or chills. Denies sinus pressure, nasal congestion, ear pain or sore throat. Denies chest congestion, productive cough or wheezing. Denies chest pains, palpitations and leg swelling Denies abdominal pain, nausea, vomiting,diarrhea or constipation.   Denies dysuria, frequency, hesitancy or incontinence. Denies headaches, seizures, numbness, or tingling. Denies depression, anxiety or insomnia. Denies skin break down or rash.        Objective:   Physical Exam  Pleasant well nourished female, alert and oriented x 3, in no cardio-pulmonary distress. Afebrile. HEENT No facial trauma or asymetry. Sinuses non tender fundoscopy, no hemorhage or exudate.  External ears normal, tympanic membranes clear. Oropharynx moist, no exudate, good dentition. Neck: supple, no adenopathy,JVD or thyromegaly.No bruits.  Chest: Clear to ascultation bilaterally.No crackles or wheezes. Non tender to palpation  Breast: No asymetry,no masses. No nipple discharge or inversion. No axillary or supraclavicular adenopathy  Cardiovascular system; Heart sounds normal,  S1 and  S2 ,no S3.  No murmur, or  thrill. Apical beat not displaced Peripheral pulses normal. Extensive superficial venous dilatation on the lower extremities  Abdomen: Soft, non tender, no organomegaly or masses. No bruits. Bowel sounds normal. No guarding, tenderness or rebound.  Rectal:  No mass. Guaiac negative stool.  GU: External genitalia normal. No lesions. Vaginal canal normal.No discharge. Uterus normal size, no adnexal masses, no cervical motion or adnexal tenderness.  Musculoskeletal exam: Full ROM of spine, hips , shoulders   Reduced in knees. Deformity , swelling and crepitus in left knee No muscle wasting or atrophy.   Neurologic: Cranial nerves 2 to 12 intact. Power, tone ,sensation and reflexes normal throughout. No disturbance in gait. No tremor.  Skin: Intact, no ulceration, erythema , scaling or rash noted. Pigmentation normal throughout  Psych; Normal mood and affect. Judgement and concentration normal Diabetic Foot Check:  Appearance - no lesions, ulcers or calluses Skin - no unusual pallor or redness Sensation - grossly intact to light touch Monofilament testing -  Right - Great toe, medial, central, lateral ball and posterior foot intact Left - Great toe, medial, central, lateral ball and posterior foot intact Pulses Left - Dorsalis Pedis and Posterior Tibia normal Right - Dorsalis Pedis and Posterior Tibia normal       Assessment & Plan:

## 2011-05-30 NOTE — Assessment & Plan Note (Signed)
Low fat diet discussed and encouraged. Pt had been using vytorin samples will start pravastatin in its place

## 2011-09-21 ENCOUNTER — Other Ambulatory Visit: Payer: Self-pay | Admitting: Family Medicine

## 2011-12-25 ENCOUNTER — Other Ambulatory Visit: Payer: Self-pay | Admitting: Family Medicine

## 2012-01-11 ENCOUNTER — Ambulatory Visit: Payer: Commercial Managed Care - PPO | Admitting: Family Medicine

## 2012-01-29 ENCOUNTER — Encounter: Payer: Self-pay | Admitting: Family Medicine

## 2012-01-29 ENCOUNTER — Telehealth: Payer: Self-pay | Admitting: Family Medicine

## 2012-01-29 ENCOUNTER — Ambulatory Visit (INDEPENDENT_AMBULATORY_CARE_PROVIDER_SITE_OTHER): Payer: Commercial Managed Care - PPO | Admitting: Family Medicine

## 2012-01-29 VITALS — BP 124/88 | HR 58 | Resp 16 | Ht 69.0 in | Wt 264.1 lb

## 2012-01-29 DIAGNOSIS — Z139 Encounter for screening, unspecified: Secondary | ICD-10-CM

## 2012-01-29 DIAGNOSIS — E669 Obesity, unspecified: Secondary | ICD-10-CM

## 2012-01-29 DIAGNOSIS — R5381 Other malaise: Secondary | ICD-10-CM

## 2012-01-29 DIAGNOSIS — M25569 Pain in unspecified knee: Secondary | ICD-10-CM

## 2012-01-29 DIAGNOSIS — G4700 Insomnia, unspecified: Secondary | ICD-10-CM

## 2012-01-29 DIAGNOSIS — R5383 Other fatigue: Secondary | ICD-10-CM

## 2012-01-29 DIAGNOSIS — E119 Type 2 diabetes mellitus without complications: Secondary | ICD-10-CM

## 2012-01-29 DIAGNOSIS — E785 Hyperlipidemia, unspecified: Secondary | ICD-10-CM

## 2012-01-29 LAB — COMPLETE METABOLIC PANEL WITH GFR
Alkaline Phosphatase: 74 U/L (ref 39–117)
CO2: 28 mEq/L (ref 19–32)
Creat: 0.82 mg/dL (ref 0.50–1.10)
GFR, Est African American: >89 mL/min
GFR, Est Non African American: 81 mL/min
Glucose, Bld: 160 mg/dL — ABNORMAL HIGH (ref 70–99)
Total Bilirubin: 1.1 mg/dL (ref 0.3–1.2)

## 2012-01-29 LAB — LIPID PANEL
HDL: 48 mg/dL (ref >39)
LDL Cholesterol: 107 mg/dL — ABNORMAL HIGH (ref 0–99)
Total CHOL/HDL Ratio: 3.7 Ratio
Triglycerides: 122 mg/dL (ref <150)

## 2012-01-29 LAB — HEMOGLOBIN A1C
Hgb A1c MFr Bld: 8.6 % — ABNORMAL HIGH (ref <5.7)
Mean Plasma Glucose: 200 mg/dL — ABNORMAL HIGH (ref <117)

## 2012-01-29 LAB — TSH: TSH: 2.078 u[IU]/mL (ref 0.350–4.500)

## 2012-01-29 MED ORDER — HYDROCODONE-ACETAMINOPHEN 5-500 MG PO TABS: ORAL_TABLET | ORAL | Status: AC

## 2012-01-29 MED ORDER — PREDNISONE (PAK) 5 MG PO TABS: 5.0000 mg | ORAL_TABLET | ORAL | Status: DC

## 2012-01-29 MED ORDER — ONETOUCH DELICA LANCETS MISC: Status: DC

## 2012-01-29 MED ORDER — SITAGLIP PHOS-METFORMIN HCL ER 50-1000 MG PO TB24: 1.0000 | ORAL_TABLET | Freq: Every day | ORAL | Status: DC

## 2012-01-29 MED ORDER — KETOROLAC TROMETHAMINE 60 MG/2ML IM SOLN
60.0000 mg | Freq: Once | INTRAMUSCULAR | Status: AC
  Administered 2012-01-29: 60 mg via INTRAMUSCULAR

## 2012-01-29 MED ORDER — GLUCOSE BLOOD VI STRP: ORAL_STRIP | Status: DC

## 2012-01-29 MED ORDER — METHYLPREDNISOLONE ACETATE 80 MG/ML IJ SUSP
80.0000 mg | Freq: Once | INTRAMUSCULAR | Status: AC
  Administered 2012-01-29: 80 mg via INTRAMUSCULAR

## 2012-01-29 MED ORDER — TEMAZEPAM 15 MG PO CAPS: 15.0000 mg | ORAL_CAPSULE | Freq: Every evening | ORAL | Status: DC | PRN

## 2012-01-29 NOTE — Patient Instructions (Addendum)
CPE  in end January.  Please work on lifestyle changes to address anxiety, and weight   toradol and depo medrol injections ion the office today for knee pain and stiffness, then prednisone dose pack, call for ortho referral if needed  New med for sleep with sleep hygiene information  Fasting lipid, cmp and eGFR, hBA1C, tSH , vit d today  hBA1c before end January.  You need to link with [facility] for help with lifestyle change and reduced cost of meds, we will give you info  Meter from office today , once daily test strips  pls sched you mammo due in October

## 2012-01-29 NOTE — Progress Notes (Signed)
  Subjective:    Patient ID: Kelly Savage, female    DOB: 1955/05/31, 56 y.o.   MRN: [id number]  HPI The PT is here for follow up and re-evaluation of chronic medical conditions, medication management and review of any available recent lab and radiology data.  Preventive health is updated, specifically  Cancer screening and Immunization.    The PT denies any adverse reactions to current medications since the last visit.  C/o increased stress particularly on the job, affecting her sleep, and she is unfortunately falling into poor eating habits with no regular physical activity and not testing blood sugars, has no meter. Also c/o increased left knee pain, occasionally feels unstable, does not want ortho eval at this time, just wants to start with treatment in office. Understands how much weight loss will help her symptoms and is motivated to work on this      Review of Systems See HPI Denies recent fever or chills. Denies sinus pressure, nasal congestion, ear pain or sore throat. Denies chest congestion, productive cough or wheezing. Denies chest pains, palpitations and leg swelling Denies abdominal pain, nausea, vomiting,diarrhea or constipation.   Denies dysuria, frequency, hesitancy or incontinence.  Denies headaches, seizures, numbness, or tingling.  Denies skin break down or rash.        Objective:   Physical Exam Patient alert and oriented and in no cardiopulmonary distress.  HEENT: No facial asymmetry, EOMI, no sinus tenderness,  oropharynx pink and moist.  Neck supple no adenopathy.  Chest: Clear to auscultation bilaterally.  CVS: S1, S2 no murmurs, no S3.  ABD: Soft non tender. Bowel sounds normal.  Ext: No edema  MS: Adequate ROM spine, shoulders, hips and reduced in  knees.  Skin: Intact, no ulcerations noted.  Psych: Good eye contact, normal affect. Memory intact slightly  anxious and  depressed appearing.  CNS: CN 2-12 intact, power, tone and sensation  normal throughout.  Diabetic Foot Check:  Appearance - no lesions, ulcers or calluses Skin - no unusual pallor or redness, mild tinea pedis Sensation - grossly intact to light touch Monofilament testing -  Right - Great toe, medial, central, lateral ball and posterior foot diminished at heel Left - Great toe, medial, central, lateral ball and posterior foot diminished at heel Pulses Left - Dorsalis Pedis and Posterior Tibia normal Right - Dorsalis Pedis and Posterior Tibia normal       Assessment & Plan:

## 2012-01-29 NOTE — Telephone Encounter (Signed)
please send her predisone dose pack to [facility] cell # [phone]

## 2012-01-29 NOTE — Telephone Encounter (Signed)
Sent to [facility]  

## 2012-01-31 NOTE — Assessment & Plan Note (Signed)
Increased and uncontrolled left knee pain, injections in office, followed by pred dose pack and also viicodin as needed, limited access. Importance of weight loss stressed

## 2012-01-31 NOTE — Assessment & Plan Note (Signed)
Uncontrolled , needs to increase to twice daily med , and also add glipizide 2.5 mg daily, and see nutritionist, needs to lose weight

## 2012-01-31 NOTE — Assessment & Plan Note (Addendum)
unchanged Patient re-educated about  the importance of commitment to a  minimum of 150 minutes of exercise per week. The importance of healthy food choices with portion control discussed. Encouraged to start a food diary, count calories and to consider  joining a support group. Sample diet sheets offered. Goals set by the patient for the next several months.    

## 2012-01-31 NOTE — Assessment & Plan Note (Signed)
Elevated LDL, no med change, only dietary, uncontrolled at this time

## 2012-01-31 NOTE — Assessment & Plan Note (Signed)
Sleep hygiene discussed. Strategies to reduce anxiety which is provoking this also discussed. Trial of med to assist

## 2012-02-01 ENCOUNTER — Telehealth: Payer: Self-pay | Admitting: Family Medicine

## 2012-02-01 NOTE — Telephone Encounter (Signed)
I spoke with pt re abnormal labs, blood sugar uncontrolled, needs to also start once daily glipizide 5mg , script entered, pls fax to [facility] she is aware

## 2012-02-02 ENCOUNTER — Telehealth: Payer: Self-pay | Admitting: Family Medicine

## 2012-02-02 ENCOUNTER — Other Ambulatory Visit: Payer: Self-pay

## 2012-02-02 DIAGNOSIS — E119 Type 2 diabetes mellitus without complications: Secondary | ICD-10-CM

## 2012-02-02 MED ORDER — GLIPIZIDE ER 5 MG PO TB24: 5.0000 mg | ORAL_TABLET | Freq: Every day | ORAL | Status: DC

## 2012-02-02 NOTE — Telephone Encounter (Signed)
Noted and faxed.  

## 2012-02-02 NOTE — Telephone Encounter (Signed)
Patient aware that rx sent to [facility].

## 2012-02-09 ENCOUNTER — Other Ambulatory Visit: Payer: Self-pay | Admitting: Family Medicine

## 2012-02-09 DIAGNOSIS — Z1231 Encounter for screening mammogram for malignant neoplasm of breast: Secondary | ICD-10-CM

## 2012-02-24 ENCOUNTER — Ambulatory Visit (HOSPITAL_COMMUNITY)
Admission: RE | Admit: 2012-02-24 | Discharge: 2012-02-24 | Disposition: A | Discharge status: Home / Self Care | Payer: 59 | Source: Ambulatory Visit | Attending: Family Medicine | Admitting: Family Medicine

## 2012-02-24 DIAGNOSIS — Z1231 Encounter for screening mammogram for malignant neoplasm of breast: Secondary | ICD-10-CM | POA: Insufficient documentation

## 2012-09-20 ENCOUNTER — Ambulatory Visit (INDEPENDENT_AMBULATORY_CARE_PROVIDER_SITE_OTHER): Payer: Commercial Managed Care - PPO | Admitting: Family Medicine

## 2012-09-20 ENCOUNTER — Other Ambulatory Visit (HOSPITAL_COMMUNITY)
Admission: RE | Admit: 2012-09-20 | Discharge: 2012-09-20 | Disposition: A | Discharge status: Home / Self Care | Payer: 59 | Source: Ambulatory Visit | Attending: Family Medicine | Admitting: Family Medicine

## 2012-09-20 ENCOUNTER — Encounter: Payer: Self-pay | Admitting: Family Medicine

## 2012-09-20 VITALS — BP 130/88 | HR 77 | Resp 16 | Ht 69.0 in | Wt 260.0 lb

## 2012-09-20 DIAGNOSIS — E119 Type 2 diabetes mellitus without complications: Secondary | ICD-10-CM

## 2012-09-20 DIAGNOSIS — M25569 Pain in unspecified knee: Secondary | ICD-10-CM

## 2012-09-20 DIAGNOSIS — Z01419 Encounter for gynecological examination (general) (routine) without abnormal findings: Secondary | ICD-10-CM | POA: Insufficient documentation

## 2012-09-20 DIAGNOSIS — Z124 Encounter for screening for malignant neoplasm of cervix: Secondary | ICD-10-CM

## 2012-09-20 DIAGNOSIS — E669 Obesity, unspecified: Secondary | ICD-10-CM

## 2012-09-20 DIAGNOSIS — Z139 Encounter for screening, unspecified: Secondary | ICD-10-CM

## 2012-09-20 DIAGNOSIS — Z1151 Encounter for screening for human papillomavirus (HPV): Secondary | ICD-10-CM | POA: Insufficient documentation

## 2012-09-20 DIAGNOSIS — E785 Hyperlipidemia, unspecified: Secondary | ICD-10-CM

## 2012-09-20 DIAGNOSIS — Z0000 Encounter for general adult medical examination without abnormal findings: Secondary | ICD-10-CM

## 2012-09-20 DIAGNOSIS — Z1211 Encounter for screening for malignant neoplasm of colon: Secondary | ICD-10-CM

## 2012-09-20 LAB — COMPLETE METABOLIC PANEL WITH GFR
ALT: 16 U/L (ref 0–35)
Albumin: 4.2 g/dL (ref 3.5–5.2)
Alkaline Phosphatase: 62 U/L (ref 39–117)
CO2: 29 mEq/L (ref 19–32)
GFR, Est Non African American: 80 mL/min
Glucose, Bld: 155 mg/dL — ABNORMAL HIGH (ref 70–99)
Potassium: 4.3 mEq/L (ref 3.5–5.3)
Sodium: 138 mEq/L (ref 135–145)
Total Bilirubin: 1.2 mg/dL (ref 0.3–1.2)
Total Protein: 6.6 g/dL (ref 6.0–8.3)

## 2012-09-20 LAB — POC HEMOCCULT BLD/STL (OFFICE/1-CARD/DIAGNOSTIC)

## 2012-09-20 LAB — LIPID PANEL
LDL Cholesterol: 85 mg/dL (ref 0–99)
Total CHOL/HDL Ratio: 3.6 Ratio

## 2012-09-20 MED ORDER — HYDROCODONE-ACETAMINOPHEN 5-325 MG PO TABS: ORAL_TABLET | ORAL | Status: DC

## 2012-09-20 MED ORDER — PHENTERMINE HCL 37.5 MG PO TBDP: 1.0000 | ORAL_TABLET | Freq: Every day | ORAL | Status: DC

## 2012-09-20 NOTE — Patient Instructions (Addendum)
F/u in 3 month, 3 weeks, sooner if needed  Nurse visit every 4 weeks for weight only Labs today, lipid, cmp and EGFR, HBA1C, vit D  Please schedule appt with Dr Nile Riggs for eye exam as past due  New is phentermine half tablet at lunch, script says one, use only half  Commit to 30 minutes at least of physical activity daily  Weight loss goal of 4 to 5 pounds per month  You are referred to Dr Eulah Pont re knees.  Please log into my chart  Speak with office staff tomorrow re lab results, and med changes which may be needed

## 2012-09-20 NOTE — Assessment & Plan Note (Signed)
Hyperlipidemia:Low fat diet discussed and encouraged.  Updated lab today, will f/u withpt with result

## 2012-09-20 NOTE — Assessment & Plan Note (Signed)
Deteriorated. Patient re-educated about  the importance of commitment to a  minimum of 150 minutes of exercise per week. The importance of healthy food choices with portion control discussed. Encouraged to start a food diary, count calories and to consider  joining a support group. Sample diet sheets offered. Goals set by the patient for the next several months. Pt to start half phentermine daily, discussed black box warning of shortness of breath, also if develops chest tightness or palpitations , then stop drug and call

## 2012-09-20 NOTE — Assessment & Plan Note (Signed)
Pelvic and breast as documented. Major challenge in health currently is weight related, pt seems highly motivated to taking control of this situation. She will start phentermine, commit to regular physical activity and weight monthly in office

## 2012-09-20 NOTE — Assessment & Plan Note (Signed)
Updated lab today, sounds as though uncontrolled, will call pt when results are available

## 2012-09-20 NOTE — Assessment & Plan Note (Signed)
Worsened, limiting movement, no instability noted, will refer to ortho

## 2012-09-20 NOTE — Progress Notes (Signed)
  Subjective:    Patient ID: Kelly Savage, female    DOB: 07/08/1955, 57 y.o.   MRN: [id number]  HPI The PT is here for annual exam  and re-evaluation of chronic medical conditions, medication management and review of any available recent lab and radiology data.  Preventive health is updated, specifically  Cancer screening and Immunization.    The PT denies any adverse reactions to current medications since the last visit.  C/o worsening bilateral knee pain and stiffness, denies instability, wants ortho eval, ready for 'injections"  Has not been checking blood sugar, unfortunately this is out of control, understands the need to address this  Ready to take ownership of weight, wants to weight in mionthly and will also start phentermine       Review of Systems See HPI Denies recent fever or chills. Denies sinus pressure, nasal congestion, ear pain or sore throat. Denies chest congestion, productive cough or wheezing. Denies chest pains, palpitations and leg swelling Denies abdominal pain, nausea, vomiting,diarrhea or constipation.   Denies dysuria, frequency, hesitancy or incontinence.  Denies headaches, seizures, numbness, or tingling. Denies depression, anxiety or insomnia. Denies skin break down or rash.        Objective:   Physical Exam  .Pleasant well nourished female, alert and oriented x 3, in no cardio-pulmonary distress. Afebrile. HEENT No facial trauma or asymetry. Sinuses non tender.  EOMI, PERTL, fundoscopic exam is normal, no hemorhage or exudate.  External ears normal, tympanic membranes clear. Oropharynx moist, no exudate, good dentition. Neck: supple, no adenopathy,JVD or thyromegaly.No bruits.  Chest: Clear to ascultation bilaterally.No crackles or wheezes. Non tender to palpation  Breast: No asymetry,no masses. No nipple discharge or inversion. No axillary or supraclavicular adenopathy  Cardiovascular system; Heart sounds normal,  S1 and  S2  ,no S3.  No murmur, or thrill. Apical beat not displaced Peripheral pulses normal.  Abdomen: Soft, non tender, no organomegaly or masses. No bruits. Bowel sounds normal. No guarding, tenderness or rebound.  Rectal:  No mass. Guaiac negative stool.  GU: External genitalia normal. No lesions. Vaginal canal normal.No discharge. Uterus normal size, no adnexal masses, no cervical motion or adnexal tenderness.  Musculoskeletal exam: Full ROM of spine, hips , shoulders and reduced in  knees. Marked deformity swelling and  Crepitus of knees  noted. No muscle wasting or atrophy.   Neurologic: Cranial nerves 2 to 12 intact. Power, tone ,sensation and reflexes normal throughout. No disturbance in gait. No tremor.  Skin: Intact, no ulceration, erythema , scaling or rash noted. Pigmentation normal throughout  Psych; Normal mood and affect. Judgement and concentration normal       Assessment & Plan:

## 2012-09-21 ENCOUNTER — Other Ambulatory Visit: Payer: Self-pay | Admitting: Family Medicine

## 2012-09-21 LAB — MICROALBUMIN / CREATININE URINE RATIO
Creatinine, Urine: 112.3 mg/dL
Microalb Creat Ratio: 4.5 mg/g (ref 0.0–30.0)
Microalb, Ur: 0.5 mg/dL (ref 0.00–1.89)

## 2012-09-21 LAB — VITAMIN D 25 HYDROXY (VIT D DEFICIENCY, FRACTURES): Vit D, 25-Hydroxy: 31 ng/mL (ref 30–89)

## 2012-09-30 ENCOUNTER — Other Ambulatory Visit: Payer: Self-pay | Admitting: Family Medicine

## 2012-09-30 ENCOUNTER — Telehealth: Payer: Self-pay | Admitting: Family Medicine

## 2012-09-30 MED ORDER — AZITHROMYCIN 250 MG PO TABS: ORAL_TABLET | ORAL | Status: AC

## 2012-09-30 NOTE — Telephone Encounter (Signed)
z pack sent to pharmacy pls let her knwo

## 2012-09-30 NOTE — Telephone Encounter (Signed)
Tried to call patient for more information but she was not available. Can she get something sent for sinus infection with green mucus?

## 2012-09-30 NOTE — Telephone Encounter (Signed)
Patient aware.

## 2012-11-10 ENCOUNTER — Other Ambulatory Visit: Payer: Self-pay

## 2013-01-20 ENCOUNTER — Ambulatory Visit: Payer: Commercial Managed Care - PPO | Admitting: Family Medicine

## 2013-01-27 ENCOUNTER — Ambulatory Visit (INDEPENDENT_AMBULATORY_CARE_PROVIDER_SITE_OTHER): Payer: Commercial Managed Care - PPO | Admitting: Family Medicine

## 2013-01-27 ENCOUNTER — Encounter: Payer: Self-pay | Admitting: Family Medicine

## 2013-01-27 VITALS — BP 120/82 | HR 90 | Resp 16 | Wt 253.1 lb

## 2013-01-27 DIAGNOSIS — E785 Hyperlipidemia, unspecified: Secondary | ICD-10-CM

## 2013-01-27 DIAGNOSIS — E669 Obesity, unspecified: Secondary | ICD-10-CM

## 2013-01-27 DIAGNOSIS — E119 Type 2 diabetes mellitus without complications: Secondary | ICD-10-CM

## 2013-01-27 DIAGNOSIS — R5381 Other malaise: Secondary | ICD-10-CM

## 2013-01-27 DIAGNOSIS — M25569 Pain in unspecified knee: Secondary | ICD-10-CM

## 2013-01-27 LAB — CBC WITH DIFFERENTIAL/PLATELET
Eosinophils Absolute: 0.1 10*3/uL (ref 0.0–0.7)
Hemoglobin: 13 g/dL (ref 12.0–15.0)
Lymphocytes Relative: 24 % (ref 12–46)
Lymphs Abs: 1.5 10*3/uL (ref 0.7–4.0)
MCH: 29.7 pg (ref 26.0–34.0)
Monocytes Relative: 5 % (ref 3–12)
Neutro Abs: 4.1 10*3/uL (ref 1.7–7.7)
Neutrophils Relative %: 68 % (ref 43–77)
Platelets: 231 10*3/uL (ref 150–400)
RBC: 4.38 MIL/uL (ref 3.87–5.11)
WBC: 6.1 10*3/uL (ref 4.0–10.5)

## 2013-01-27 LAB — COMPLETE METABOLIC PANEL WITH GFR
BUN: 9 mg/dL (ref 6–23)
CO2: 28 mEq/L (ref 19–32)
Calcium: 9.3 mg/dL (ref 8.4–10.5)
Chloride: 104 mEq/L (ref 96–112)
Creat: 0.76 mg/dL (ref 0.50–1.10)
GFR, Est African American: >89 mL/min
GFR, Est Non African American: 88 mL/min
Glucose, Bld: 166 mg/dL — ABNORMAL HIGH (ref 70–99)
Total Bilirubin: 1 mg/dL (ref 0.3–1.2)

## 2013-01-27 LAB — HEMOGLOBIN A1C: Mean Plasma Glucose: 209 mg/dL — ABNORMAL HIGH (ref <117)

## 2013-01-27 MED ORDER — HYDROCODONE-ACETAMINOPHEN 5-325 MG PO TABS: ORAL_TABLET | ORAL | Status: DC

## 2013-01-27 MED ORDER — CANAGLIFLOZIN 300 MG PO TABS: 1.0000 | ORAL_TABLET | Freq: Every day | ORAL | Status: DC

## 2013-01-27 MED ORDER — SITAGLIP PHOS-METFORMIN HCL ER 50-1000 MG PO TB24: 1.0000 | ORAL_TABLET | Freq: Two times a day (BID) | ORAL | Status: DC

## 2013-01-27 MED ORDER — KETOROLAC TROMETHAMINE 60 MG/2ML IM SOLN
60.0000 mg | Freq: Once | INTRAMUSCULAR | Status: AC
  Administered 2013-01-27: 60 mg via INTRAMUSCULAR

## 2013-01-27 MED ORDER — METHYLPREDNISOLONE ACETATE 80 MG/ML IJ SUSP
80.0000 mg | Freq: Once | INTRAMUSCULAR | Status: AC
  Administered 2013-01-27: 80 mg via INTRAMUSCULAR

## 2013-01-27 MED ORDER — PREDNISONE 5 MG PO TABS: 5.0000 mg | ORAL_TABLET | Freq: Two times a day (BID) | ORAL | Status: AC

## 2013-01-27 MED ORDER — PHENTERMINE HCL 37.5 MG PO TABS: 37.5000 mg | ORAL_TABLET | Freq: Every day | ORAL | Status: DC

## 2013-01-27 MED ORDER — PRAVASTATIN SODIUM 40 MG PO TABS: 40.0000 mg | ORAL_TABLET | Freq: Every evening | ORAL | Status: DC

## 2013-01-27 NOTE — Patient Instructions (Addendum)
F/U in first Friday in January, call if you need me before please  Congrats on 7 pund weight loss.  Please commit to daily phentermine. It does help, also physical activity avg 15 to 20 mins daily    New additional medication for blood sugar if the HBA1C is over 6.8 will be invokana 100mg  for 1 week, the if no problems standard dose of 300mg  once daily. This is in addition to the janumet which you can take both tablets once daily together Pls send me a msg today , I will be contacting and releasing results directly to you  Weight loss goal of one pound per week  Toradol and depo medrol in the office today for bilateral knee pain, also I will send in a 5 day course of prednisone  Script for 30 vicodin today, to last till next visit, unless you need to fill sooner, then call  CBc, chem 7 and EGFR  And TSH today   For January  Visit pls get fasting lab before visit lipid, cmp and EGFr and HBA1C

## 2013-01-27 NOTE — Assessment & Plan Note (Signed)
Updated lab needed, may add invokana , will wait on result Patient advised to reduce carb and sweets, commit to regular physical activity, take meds as prescribed, test blood as directed, and attempt to lose weight, to improve blood sugar control.

## 2013-01-27 NOTE — Progress Notes (Signed)
  Subjective:    Patient ID: Kelly Savage, female    DOB: Dec 05, 1955, 57 y.o.   MRN: [id number]  HPI The PT is here for follow up and re-evaluation of chronic medical conditions, medication management and review of any available recent lab and radiology data.  Preventive health is updated, specifically  Cancer screening and Immunization.   Questions or concerns regarding consultations or procedures which the PT has had in the interim are  Addressed.Had intra articular injections with short lived relief. Has upcoming eye exam The PT denies any adverse reactions to current medications since the last visit. Has been using the phentermine on an intermittent basis, half tab had no effect, has been inconsistent with exercise but severe arthritis of knees is somewhat limiting There are no new concerns.  Still notes elevated  Morning sugars with lower numbers in the afternoon despite discontinuing late snacking       Review of Systems See HPI Denies recent fever or chills. Denies sinus pressure, nasal congestion, ear pain or sore throat. Denies chest congestion, productive cough or wheezing. Denies chest pains, palpitations and leg swelling Denies abdominal pain, nausea, vomiting,diarrhea or constipation.   Denies dysuria, frequency, hesitancy or incontinence.  Denies headaches, seizures, numbness, or tingling. Denies depression, anxiety or insomnia. Denies skin break down or rash.        Objective:   Physical Exam  Patient alert and oriented and in no cardiopulmonary distress.  HEENT: No facial asymmetry, EOMI, no sinus tenderness,  oropharynx pink and moist.  Neck supple no adenopathy.  Chest: Clear to auscultation bilaterally.  CVS: S1, S2 no murmurs, no S3.  ABD: Soft non tender. Bowel sounds normal.  Ext: No edema  MS: Adequate ROM spine, shoulders, hips and reduced in  knees.  Skin: Intact, no ulcerations or rash noted.  Psych: Good eye contact, normal affect. Memory  intact not anxious or depressed appearing.  CNS: CN 2-12 intact, power, tone and sensation normal throughout.       Assessment & Plan:

## 2013-01-27 NOTE — Assessment & Plan Note (Signed)
Uncontrolled, short term relief with intra articular injections , toradol and depo medrol in office then 5 day course of prednisone , ongoing weight loss will help to preserve joints

## 2013-01-27 NOTE — Assessment & Plan Note (Signed)
Controlled, no change in medication Updated lab next visit Hyperlipidemia:Low fat diet discussed and encouraged.

## 2013-01-27 NOTE — Assessment & Plan Note (Signed)
Improved. Pt applauded on succesful weight loss through lifestyle change, and encouraged to continue same. Weight loss goal set for the next several months.  

## 2013-01-30 ENCOUNTER — Encounter: Payer: Self-pay | Admitting: Family Medicine

## 2013-01-30 ENCOUNTER — Telehealth (HOSPITAL_COMMUNITY): Payer: Self-pay | Admitting: Dietician

## 2013-01-30 NOTE — Telephone Encounter (Signed)
Called and left message at [phone].

## 2013-01-30 NOTE — Telephone Encounter (Signed)
Received referral via Dr. Lodema Hong via fax for dx: obesity, diabetes.

## 2013-01-31 NOTE — Telephone Encounter (Signed)
I believe that patient's are not able to see the information typed under result notes.  They can only see the information typed in a patient message and the lab numerical values.

## 2013-02-03 NOTE — Telephone Encounter (Signed)
No response from previous contact attempt. Sent letter to pt home via [employer] in attempt to contact pt to schedule appointment.  

## 2013-02-06 NOTE — Telephone Encounter (Signed)
Noted  

## 2013-02-06 NOTE — Telephone Encounter (Signed)
Called back at [phone]. Appointment scheduled for 02/20/13 at 1100.

## 2013-02-06 NOTE — Telephone Encounter (Signed)
noted 

## 2013-02-06 NOTE — Telephone Encounter (Signed)
Received voicemail from pt left at [phone].

## 2013-02-20 ENCOUNTER — Encounter (HOSPITAL_COMMUNITY): Payer: Self-pay | Admitting: Dietician

## 2013-02-20 NOTE — Progress Notes (Signed)
Also provided pt with information on [facility] to Wellness program and discussed benefits of enrollment. Provided contact information and enrollment packet. Pt reports she will consider this.

## 2013-02-20 NOTE — Progress Notes (Signed)
Outpatient Initial Nutrition Assessment  Date:02/20/2013   Appt Start Time: 1105  Referring Physician: Dr. Lodema Hong Reason for Visit: obesity, diabetes  Nutrition Assessment:  Height: 5\' 9"  (175.3 cm)   Weight: 243 lb (110.224 kg)   IBW: 145# %IBW: 167% UBW: 230# %UBW: 106% Body mass index is 35.87 kg/(m^2). Meets criteria for obesity, class II. Goal Weight: 219# (10% loss of current body weight) Weight hx: Pt reports fluctuations in weight. Highest weight was 280# about 5 years ago. Pt lowest weight was 230's also about 5 years ago. She reports she was 260# last year. Noted 11#  (4.3%)wt loss x 1 month. She has always struggled with weight; pt reports she was 180# when she graduated high school.  Wt Readings from Last 10 Encounters:  02/20/13 243 lb (110.224 kg)  01/27/13 253 lb 1.9 oz (114.814 kg)  09/20/12 260 lb (117.935 kg)  01/29/12 264 lb 1.9 oz (119.804 kg)  05/28/11 265 lb 1.9 oz (120.258 kg)  01/08/11 257 lb (116.574 kg)  07/22/10 247 lb 1.9 oz (112.093 kg)  03/18/10 246 lb 4 oz (111.698 kg)    Estimated nutritional needs:  Kcals/ day:  1700-1800 Protein (grams)/day: 88-1110 Fluid (L)/ day: 1.7-1.8  PMH:  Past Medical History  Diagnosis Date  . Diabetes mellitus type II 2006  . Obesity all her life     lost 28 lbs in 14 weeks   . Ankle pain x3 months    bilateral  . Thigh pain x 1 year     bilateral, esp when laying down, worse in the morning , and goes away with activity  . Right thigh pain   . Depression     Due to loss of mother had a fib massive stroke, died after , she was her best friend     Medications:  Current Outpatient Rx  Name  Route  Sig  Dispense  Refill  . Calcium Carbonate-Vit D-Min (CALCIUM 1200 PO)   Oral   Take 1 tablet by mouth daily.           . Canagliflozin (INVOKANA) 300 MG TABS   Oral   Take 1 tablet (300 mg total) by mouth daily.   90 tablet   3   . Cholecalciferol (VITAMIN D) 2000 UNITS CAPS   Oral   Take 1 capsule by  mouth.           Marland Kitchen glucose blood (ONETOUCH VERIO IQ) test strip      Use as instructed once daily testing   32 each   11   . HYDROcodone-acetaminophen (NORCO/VICODIN) 5-325 MG per tablet      One tablet once daily, as needed, for uncontrolled knee pain  Thirty tablets to last 3 months, expected   30 tablet   0   . ONETOUCH DELICA LANCETS MISC      Once daily testing   100 each   11   . phentermine (ADIPEX-P) 37.5 MG tablet   Oral   Take 1 tablet (37.5 mg total) by mouth daily before breakfast.   30 tablet   3   . pravastatin (PRAVACHOL) 40 MG tablet   Oral   Take 1 tablet (40 mg total) by mouth every evening.   90 tablet   1   . SitaGLIPtin-MetFORMIN HCl (JANUMET XR) 50-1000 MG TB24   Oral   Take 1 tablet by mouth 2 (two) times daily.   180 tablet   1     Labs: CMP  Component Value Date/Time   NA 139 01/27/2013 1148   K 4.1 01/27/2013 1148   CL 104 01/27/2013 1148   CO2 28 01/27/2013 1148   GLUCOSE 166* 01/27/2013 1148   BUN 9 01/27/2013 1148   CREATININE 0.76 01/27/2013 1148   CREATININE 0.72 03/18/2010 2228   CALCIUM 9.3 01/27/2013 1148   PROT 6.6 01/27/2013 1148   ALBUMIN 4.1 01/27/2013 1148   AST 16 01/27/2013 1148   ALT 19 01/27/2013 1148   ALKPHOS 61 01/27/2013 1148   BILITOT 1.0 01/27/2013 1148    Lipid Panel     Component Value Date/Time   CHOL 156 09/20/2012 0940   TRIG 142 09/20/2012 0940   HDL 43 09/20/2012 0940   CHOLHDL 3.6 09/20/2012 0940   VLDL 28 09/20/2012 0940   LDLCALC 85 09/20/2012 0940     Lab Results  Component Value Date   HGBA1C 8.9* 01/27/2013   HGBA1C 7.2* 09/20/2012   HGBA1C 8.6* 01/29/2012   Lab Results  Component Value Date   MICROALBUR 0.50 09/20/2012   LDLCALC 85 09/20/2012   CREATININE 0.76 01/27/2013     Lifestyle/ social habits: Kelly Savage is a very pleasant woman who resides in [place] with her husband. 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 She has an adult daughter and son, who lives  outside the home. She reports her stress level is improving, reporting the she has "empty nest syndrome" due to her daughter moving away to college this year. She is currently not physically active, but has exercised in the past, but consistently. She states that she has walked, but barriers include her knees and arthritis, although she reports that this has improved with weight loss. She is contemplating using her home treadmill and walking at home for exercise.   Nutrition hx/habits: Kelly Savage reports that she is a "stress eater". She in concerned about her Hgb A1c levels, which has risen over the past few PCP visits. She reports that she has recently been losing weight by choosing healthier snacks and by portion control. She reports that it is easier to control portions, as her children no longer live with her. She has started grilling meats and eliminated high calorie beverages from her diet. She occasionally skips meals. She checks her CBGs every AM and results are typically in the 150's (she reports reading of 154 this AM). She admits that since being put on phentermine and Invonkana, she has seen a drastic improvement in her blood sugars (usually in the 180's).  She in interested in carb counting. She reports that she ate a lot of fruit and drank a lot of frappuchinos over the summer and she attributes this at least partly to her poor glycemic control.   Diet recall: Breakfast (1610-9604): 1 cinnamon eggo waffle, diet soda; AM snack: apple or orange; Lunch (1200-1400): ham and potato salad OR salad OR bowl of soup, diet soda; PM snack: peanuts or apple or orange; Dinner (1800-1900): chicken and salad OR hamburger and salad; HS snack: popcorn or no sugar added ice cream (1/2 cup)  Nutrition Diagnosis: Inconsistent carbohydrate intake r/t diet recall AEB Hgb A1c: 8.9.   Nutrition Intervention: Nutrition rx: 1400-1500 kcal NAS, diabetic diet; 3 meals per day (45-60 grams per meal); 1-2 snacks daily  (15-30 grams carbohydrates per meal); low calorie beverages only; 2.5 hours physical activity per week  Education/Counseling Provided: Educated pt on principles of diabetic diet. Discussed carbohydrate metabolism in relation to diabetes. Educated pt on  basic self-management principles including: signs and symptoms of hyperglycemia and hypoglycemia, goals for fasting and postprandial blood sugars, goals for Hgb A1c, importance of checking feet, importance of keeping PCP appointments, and foot care. Educated pt on plate method, portion sizes, and sources of carbohydrate. Discussed importance of regular meal pattern. Educated on carbohydrate counting. Discussed importance of adding sources of whole grains to diet to improve glycemic control. Also encouraged to choose low fat dairy, lean meats, and whole fruits and vegetables more often. Discussed options of artificial sweeteners and encouraged pt to use which brand she liked best. Discussed nutritional content of foods commonly eaten and discussed healthier alternatives. Discussed importance of compliance to prevent further complications of disease. Educated pt on importance of physical activity (goal of at least 30 minutes 5 times per week) along with a healthy diet to achieve weight loss and glycemic goals. Encouraged slow, moderate weight loss of 1-2# per week, or 7-10% of current body weight. Provided "Diabetes and You" and "Carb Counting and Meal Planning" handouts. Used TeachBack to assess understanding.   Understanding, Motivation, Ability to Follow Recommendations: Expect fair compliance.   Monitoring and Evaluation: Goals: 1) 0.5-2# weight loss per week; 2) 3 meals per day; 3) Hgb A1c < 7.0; 4) 2.5 hours physical activity per week  Recommendations: 1) For weight loss 1400-1500 kcals daily; 2) Try to eat around the same time each day; 3) Use measuring cups to ensure proper portions; 4) Break physical activity up into smaller, more frequent  sessions  F/U: PRN. Pt declined follow-up. Provided RD contact information.   Ninel Abdella A. Mayford Knife, RD, LDN 02/20/2013  Appt EndTime: 1235

## 2013-02-20 NOTE — Progress Notes (Signed)
Noted  

## 2013-06-08 ENCOUNTER — Other Ambulatory Visit: Payer: Self-pay | Admitting: Family Medicine

## 2013-06-08 DIAGNOSIS — Z1231 Encounter for screening mammogram for malignant neoplasm of breast: Secondary | ICD-10-CM

## 2013-06-09 ENCOUNTER — Encounter: Payer: Self-pay | Admitting: Family Medicine

## 2013-06-16 ENCOUNTER — Other Ambulatory Visit: Payer: Self-pay | Admitting: Family Medicine

## 2013-06-16 ENCOUNTER — Ambulatory Visit (HOSPITAL_COMMUNITY)
Admission: RE | Admit: 2013-06-16 | Discharge: 2013-06-16 | Disposition: A | Discharge status: Home / Self Care | Payer: 59 | Source: Ambulatory Visit | Attending: Family Medicine | Admitting: Family Medicine

## 2013-06-16 DIAGNOSIS — Z1231 Encounter for screening mammogram for malignant neoplasm of breast: Secondary | ICD-10-CM | POA: Insufficient documentation

## 2013-06-16 LAB — COMPLETE METABOLIC PANEL WITH GFR
ALT: 18 U/L (ref 0–35)
AST: 16 U/L (ref 0–37)
Albumin: 3.9 g/dL (ref 3.5–5.2)
Alkaline Phosphatase: 56 U/L (ref 39–117)
BUN: 16 mg/dL (ref 6–23)
CO2: 28 mEq/L (ref 19–32)
Calcium: 9 mg/dL (ref 8.4–10.5)
Chloride: 104 mEq/L (ref 96–112)
Creat: 0.71 mg/dL (ref 0.50–1.10)
GFR, Est African American: >89 mL/min
GFR, Est Non African American: >89 mL/min
Glucose, Bld: 125 mg/dL — ABNORMAL HIGH (ref 70–99)
Potassium: 4 mEq/L (ref 3.5–5.3)
Sodium: 140 mEq/L (ref 135–145)
Total Bilirubin: 1 mg/dL (ref 0.2–1.2)
Total Protein: 6.5 g/dL (ref 6.0–8.3)

## 2013-06-16 LAB — LIPID PANEL
Cholesterol: 168 mg/dL (ref 0–200)
HDL: 47 mg/dL (ref >39)
LDL Cholesterol: 100 mg/dL — ABNORMAL HIGH (ref 0–99)
Total CHOL/HDL Ratio: 3.6 Ratio
Triglycerides: 105 mg/dL (ref <150)
VLDL: 21 mg/dL (ref 0–40)

## 2013-06-16 LAB — HEMOGLOBIN A1C
Hgb A1c MFr Bld: 7.6 % — ABNORMAL HIGH (ref <5.7)
Mean Plasma Glucose: 171 mg/dL — ABNORMAL HIGH (ref <117)

## 2013-06-21 ENCOUNTER — Ambulatory Visit: Payer: Commercial Managed Care - PPO | Admitting: Family Medicine

## 2013-07-17 ENCOUNTER — Ambulatory Visit (INDEPENDENT_AMBULATORY_CARE_PROVIDER_SITE_OTHER): Payer: 59 | Admitting: Family Medicine

## 2013-07-17 ENCOUNTER — Encounter: Payer: Self-pay | Admitting: Family Medicine

## 2013-07-17 VITALS — BP 120/84 | HR 92 | Resp 16 | Wt 241.0 lb

## 2013-07-17 DIAGNOSIS — E669 Obesity, unspecified: Secondary | ICD-10-CM

## 2013-07-17 DIAGNOSIS — E119 Type 2 diabetes mellitus without complications: Secondary | ICD-10-CM

## 2013-07-17 DIAGNOSIS — E785 Hyperlipidemia, unspecified: Secondary | ICD-10-CM

## 2013-07-17 DIAGNOSIS — M25569 Pain in unspecified knee: Secondary | ICD-10-CM

## 2013-07-17 MED ORDER — CANAGLIFLOZIN 300 MG PO TABS
1.0000 | ORAL_TABLET | Freq: Every day | ORAL | Status: AC
Start: 2013-07-17 — End: 2014-07-17

## 2013-07-17 MED ORDER — PREDNISONE 5 MG PO TABS
5.0000 mg | ORAL_TABLET | Freq: Two times a day (BID) | ORAL | Status: AC
Start: 2013-07-17 — End: 2013-07-21

## 2013-07-17 MED ORDER — SITAGLIP PHOS-METFORMIN HCL ER 50-1000 MG PO TB24: 1.0000 | ORAL_TABLET | Freq: Two times a day (BID) | ORAL | Status: DC

## 2013-07-17 MED ORDER — PHENTERMINE HCL 37.5 MG PO TABS: 37.5000 mg | ORAL_TABLET | Freq: Every day | ORAL | Status: DC

## 2013-07-17 MED ORDER — HYDROCODONE-ACETAMINOPHEN 5-325 MG PO TABS: ORAL_TABLET | ORAL | Status: DC

## 2013-07-17 NOTE — Patient Instructions (Signed)
F/u with rectal in 3.5 month, please call if you need me before  CONGRATS on improved blood sugar and weight loss, please keep both up.   Phentermine half daily only on a consistent basis will work for you at this time, I believe, if you find you need to increase to 1 daily, please call and let me know  Pain contract needs to be signed today for the hydrocodone tablet one daily for pain in knee  Please reduce the egg yolks and cheese, LDL is slightly high  Non fast chem 7 and EGFR and HBa1C in 3.5 month, before next visit

## 2013-10-08 ENCOUNTER — Telehealth: Payer: Self-pay | Admitting: Family Medicine

## 2013-10-08 NOTE — Progress Notes (Signed)
   Subjective:    Patient ID: Kelly Savage, female    DOB: 04/06/1956, 58 y.o.   MRN: [id number]  HPI The PT is here for follow up and re-evaluation of chronic medical conditions, medication management and review of any available recent lab and radiology data.  Preventive health is updated, specifically  Cancer screening and Immunization.   Questions or concerns regarding consultations or procedures which the PT has had in the interim are  Addressed.Had injection in knee lasted for a short time The PT denies any adverse reactions to current medications since the last visit.  Requests help with pain in knee, feels very debilitated in heh evening   Working hard on diet change to im prove blod sugar and lose weight, little success so far in weight loss, will try appetite suppressant.    Review of Systems See HPI Denies recent fever or chills. Denies sinus pressure, nasal congestion, ear pain or sore throat. Denies chest congestion, productive cough or wheezing. Denies chest pains, palpitations and leg swelling Denies abdominal pain, nausea, vomiting,diarrhea or constipation.   Denies dysuria, frequency, hesitancy or incontinence. . Denies headaches, seizures, numbness, or tingling. Denies depression, anxiety or insomnia. Denies skin break down or rash.        Objective:   Physical Exam BP 120/84  Pulse 92  Resp 16  Wt 241 lb (109.317 kg)  SpO2 96% Patient alert and oriented and in no cardiopulmonary distress.  HEENT: No facial asymmetry, EOMI,   oropharynx pink and moist.  Neck supple no JVD, no mass.  Chest: Clear to auscultation bilaterally.  CVS: S1, S2 no murmurs, no S3.  ABD: Soft non tender.   Ext: No edema  MS: Adequate ROM spine, shoulders, hips and markedly decreased in  Knees which are also deformed and she has crepitus also  Skin: Intact, no ulcerations or rash noted.  Psych: Good eye contact, normal affect. Memory intact not anxious or depressed  appearing.  CNS: CN 2-12 intact, power,  normal throughout.no focal deficits noted.        Assessment & Plan:  DIABETES MELLITUS, TYPE II Improved but still npot at goal Patient advised to reduce carb and sweets, commit to regular physical activity, take meds as prescribed, test blood as directed, and attempt to lose weight, to improve blood sugar control.   Obesity Unchanged Patient re-educated about  the importance of commitment to a  minimum of 150 minutes of exercise per week. The importance of healthy food choices with portion control discussed. Encouraged to start a food diary, count calories and to consider  joining a support group. Sample diet sheets offered. Goals set by the patient for the next several months.     Hyperlipidemia LDL goal < 100 Almost at goal Hyperlipidemia:Low fat diet discussed and encouraged.  No med change  Knee pain Chronic daily pain, requests hydrocodone for regular use , pain contract signed and she is to start scheduled pain ,med

## 2013-10-08 NOTE — Assessment & Plan Note (Signed)
Improved but still npot at goal Patient advised to reduce carb and sweets, commit to regular physical activity, take meds as prescribed, test blood as directed, and attempt to lose weight, to improve blood sugar control.

## 2013-10-08 NOTE — Assessment & Plan Note (Signed)
Almost at goal Hyperlipidemia:Low fat diet discussed and encouraged.  No med change

## 2013-10-08 NOTE — Assessment & Plan Note (Signed)
Chronic daily pain, requests hydrocodone for regular use , pain contract signed and she is to start scheduled pain ,med

## 2013-10-08 NOTE — Assessment & Plan Note (Signed)
Unchanged. Patient re-educated about  the importance of commitment to a  minimum of 150 minutes of exercise per week. The importance of healthy food choices with portion control discussed. Encouraged to start a food diary, count calories and to consider  joining a support group. Sample diet sheets offered. Goals set by the patient for the next several months.    

## 2013-10-08 NOTE — Telephone Encounter (Signed)
Pt's f/u is past due and none in system Pls contact her and schedule, she needs to come in on a regular basis. and she will need non fasting labs BEFORE visit, generally gets after which is not as helpful pls explain. I will ask Merry Proud to order labs and fax /arrange for her to get labn order so pls co ordinate with Merry Proud

## 2013-10-09 ENCOUNTER — Telehealth: Payer: Self-pay

## 2013-10-09 DIAGNOSIS — E119 Type 2 diabetes mellitus without complications: Secondary | ICD-10-CM

## 2013-10-09 NOTE — Telephone Encounter (Signed)
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

## 2013-10-09 NOTE — Telephone Encounter (Signed)
Called patient and advised she needed to call back and schedule an appt for July and I was mailing her labs ( a1c, chem 7 egfr and micoralb per Dr) to get done a few days before her visit.

## 2013-11-07 LAB — BASIC METABOLIC PANEL WITH GFR
BUN: 22 mg/dL (ref 6–23)
CHLORIDE: 102 meq/L (ref 96–112)
CO2: 28 mEq/L (ref 19–32)
Calcium: 9.5 mg/dL (ref 8.4–10.5)
Creat: 0.76 mg/dL (ref 0.50–1.10)
GFR, EST NON AFRICAN AMERICAN: 87 mL/min
GFR, Est African American: >89 mL/min
Glucose, Bld: 157 mg/dL — ABNORMAL HIGH (ref 70–99)
POTASSIUM: 4.5 meq/L (ref 3.5–5.3)
SODIUM: 138 meq/L (ref 135–145)

## 2013-11-07 LAB — HEMOGLOBIN A1C
HEMOGLOBIN A1C: 7.6 % — AB (ref <5.7)
MEAN PLASMA GLUCOSE: 171 mg/dL — AB (ref <117)

## 2013-11-08 LAB — MICROALBUMIN / CREATININE URINE RATIO
CREATININE, URINE: 75 mg/dL
MICROALB UR: 0.5 mg/dL (ref 0.00–1.89)
MICROALB/CREAT RATIO: 6.7 mg/g (ref 0.0–30.0)

## 2013-11-15 ENCOUNTER — Ambulatory Visit (INDEPENDENT_AMBULATORY_CARE_PROVIDER_SITE_OTHER): Payer: 59 | Admitting: Family Medicine

## 2013-11-15 ENCOUNTER — Encounter: Payer: Self-pay | Admitting: Family Medicine

## 2013-11-15 VITALS — BP 110/70 | HR 70 | Resp 18 | Ht 69.0 in | Wt 235.0 lb

## 2013-11-15 DIAGNOSIS — E669 Obesity, unspecified: Secondary | ICD-10-CM

## 2013-11-15 DIAGNOSIS — M25569 Pain in unspecified knee: Secondary | ICD-10-CM

## 2013-11-15 DIAGNOSIS — E785 Hyperlipidemia, unspecified: Secondary | ICD-10-CM

## 2013-11-15 DIAGNOSIS — E119 Type 2 diabetes mellitus without complications: Secondary | ICD-10-CM

## 2013-11-15 DIAGNOSIS — Z1329 Encounter for screening for other suspected endocrine disorder: Secondary | ICD-10-CM

## 2013-11-15 MED ORDER — PHENTERMINE HCL 37.5 MG PO TABS
37.5000 mg | ORAL_TABLET | Freq: Every day | ORAL | Status: DC
Start: 2013-11-15 — End: 2014-04-18

## 2013-11-15 MED ORDER — HYDROCODONE-ACETAMINOPHEN 5-325 MG PO TABS: ORAL_TABLET | ORAL | Status: AC

## 2013-11-15 NOTE — Progress Notes (Signed)
   Subjective:    Patient ID: Kelly LamingCheryl J Savage, female    DOB: 08/17/1955, 58 y.o.   MRN: [id number]  HPI The PT is here for follow up and re-evaluation of chronic medical conditions, medication management and review of any available recent lab and radiology data.  Preventive health is updated, specifically  Cancer screening and Immunization.   Pt had to get injection in her knee because  Of uncontroled pain and swelling, will start getting injections monthly to try to rebuild the cartilage, which she is looking forward to in preference to the steroids which she has depended on to date The PT denies any adverse reactions to current medications since the last visit.  Has tolerated medication, phentermine, well with overall weight loss , and wants to continue same Denies hypoglycemic episodes, polyuria or polydipsia     Review of Systems    See HPI Denies recent fever or chills. Denies sinus pressure, nasal congestion, ear pain or sore throat. Denies chest congestion, productive cough or wheezing. Denies chest pains, palpitations and leg swelling Denies abdominal pain, nausea, vomiting,diarrhea or constipation.   Denies dysuria, frequency, hesitancy or incontinence. Denies headaches, seizures, numbness, or tingling. Denies depression, anxiety or insomnia. Denies skin break down or rash.     Objective:   Physical Exam BP 110/70  Pulse 70  Resp 18  Ht 5\' 9"  (1.753 m)  Wt 235 lb 0.6 oz (106.613 kg)  BMI 34.69 kg/m2  SpO2 97% Patient alert and oriented and in no cardiopulmonary distress.  HEENT: No facial asymmetry, EOMI,   oropharynx pink and moist.  Neck supple no JVD, no mass.  Chest: Clear to auscultation bilaterally.  CVS: S1, S2 no murmurs, no S3.Regular rate.  ABD: Soft non tender.   Ext: No edema  MS: Adequate ROM spine, shoulders, hips and knees.  Skin: Intact, no ulcerations or rash noted.  Psych: Good eye contact, normal affect. Memory intact not anxious or  depressed appearing.  CNS: CN 2-12 intact, power,  normal throughout.no focal deficits noted.        Assessment & Plan:

## 2013-11-15 NOTE — Patient Instructions (Addendum)
F/u in 4 month, call if you need me before  Foot exam today is good  Blood sugar is the same, hopefully it will be better next 4 month  Hope injections for knee help pain  Phentermine one daily, weight loss goal of 3  Pounds each month  Pls cut back on fat in diet   Fasting lipid, cmp and EGFr and hBA1C , CBC and tSH in 4 month Once daily test strips will be sent in

## 2013-11-16 NOTE — Assessment & Plan Note (Signed)
Not at goal Hyperlipidemia:Low fat diet discussed and encouraged.  Updated lab needed at/ before next visit.  

## 2013-11-16 NOTE — Assessment & Plan Note (Signed)
Improved. Pt applauded on succesful weight loss through lifestyle change, and encouraged to continue same. Weight loss goal set for the next several months.  

## 2013-11-16 NOTE — Assessment & Plan Note (Signed)
Recent flare required steroid injection by ortho, will start more definitive management Weight loss encouraged Continue vicodin as needed one daily

## 2013-11-16 NOTE — Assessment & Plan Note (Signed)
Unchanged, would like thios improved Patient advised to reduce carb and sweets, commit to regular physical activity, take meds as prescribed, test blood as directed, and attempt to lose weight, to improve blood sugar control. Updated lab needed at/ before next visit.

## 2014-01-02 ENCOUNTER — Telehealth: Payer: Self-pay | Admitting: *Deleted

## 2014-01-02 NOTE — Telephone Encounter (Signed)
Pt says she has a sinus infection per pt she is having thick yellow stuff that is draining and she wants to get a omixcilion for it per pt she is trying to work and she did not want to miss work. Please advise [phone]

## 2014-01-03 NOTE — Telephone Encounter (Signed)
Please advise 

## 2014-01-04 MED ORDER — FLUCONAZOLE 150 MG PO TABS: ORAL_TABLET | ORAL | Status: DC

## 2014-01-04 MED ORDER — AMOXICILLIN 500 MG PO CAPS: 500.0000 mg | ORAL_CAPSULE | Freq: Three times a day (TID) | ORAL | Status: DC

## 2014-01-04 NOTE — Telephone Encounter (Signed)
Pt sick for 4 days, no fevr or chills burt thick green nasal drainage , amoxicillin prescribed for 10 days and fluconazole in case of itch, she ios to call back if worsens , she is aware

## 2014-01-04 NOTE — Addendum Note (Signed)
Addended by: Syliva Overman E on: 01/04/2014 01:07 PM   Modules accepted: Orders

## 2014-01-09 ENCOUNTER — Telehealth: Payer: Self-pay | Admitting: Family Medicine

## 2014-01-09 NOTE — Telephone Encounter (Signed)
Noted  

## 2014-03-02 ENCOUNTER — Other Ambulatory Visit: Payer: Self-pay

## 2014-03-02 DIAGNOSIS — E119 Type 2 diabetes mellitus without complications: Secondary | ICD-10-CM

## 2014-03-02 MED ORDER — SITAGLIP PHOS-METFORMIN HCL ER 50-1000 MG PO TB24
1.0000 | ORAL_TABLET | Freq: Two times a day (BID) | ORAL | Status: DC
Start: 2014-03-02 — End: 2014-09-04

## 2014-03-21 ENCOUNTER — Ambulatory Visit: Payer: 59 | Admitting: Family Medicine

## 2014-04-18 ENCOUNTER — Ambulatory Visit (INDEPENDENT_AMBULATORY_CARE_PROVIDER_SITE_OTHER): Payer: 59 | Admitting: Family Medicine

## 2014-04-18 ENCOUNTER — Encounter: Payer: Self-pay | Admitting: Family Medicine

## 2014-04-18 VITALS — BP 114/64 | HR 93 | Resp 18 | Ht 69.0 in | Wt 230.0 lb

## 2014-04-18 DIAGNOSIS — M549 Dorsalgia, unspecified: Secondary | ICD-10-CM

## 2014-04-18 DIAGNOSIS — G8929 Other chronic pain: Secondary | ICD-10-CM

## 2014-04-18 DIAGNOSIS — Z1211 Encounter for screening for malignant neoplasm of colon: Secondary | ICD-10-CM

## 2014-04-18 DIAGNOSIS — E785 Hyperlipidemia, unspecified: Secondary | ICD-10-CM

## 2014-04-18 DIAGNOSIS — E669 Obesity, unspecified: Secondary | ICD-10-CM

## 2014-04-18 DIAGNOSIS — E119 Type 2 diabetes mellitus without complications: Secondary | ICD-10-CM

## 2014-04-18 DIAGNOSIS — M25569 Pain in unspecified knee: Secondary | ICD-10-CM

## 2014-04-18 DIAGNOSIS — E1169 Type 2 diabetes mellitus with other specified complication: Secondary | ICD-10-CM

## 2014-04-18 DIAGNOSIS — M62830 Muscle spasm of back: Secondary | ICD-10-CM | POA: Insufficient documentation

## 2014-04-18 LAB — COMPLETE METABOLIC PANEL WITH GFR
ALT: 18 U/L (ref 0–35)
AST: 16 U/L (ref 0–37)
Albumin: 4.4 g/dL (ref 3.5–5.2)
Alkaline Phosphatase: 64 U/L (ref 39–117)
BUN: 20 mg/dL (ref 6–23)
CALCIUM: 9.9 mg/dL (ref 8.4–10.5)
CHLORIDE: 100 meq/L (ref 96–112)
CO2: 29 meq/L (ref 19–32)
Creat: 0.7 mg/dL (ref 0.50–1.10)
GFR, Est Non African American: >89 mL/min
Glucose, Bld: 112 mg/dL — ABNORMAL HIGH (ref 70–99)
POTASSIUM: 4.3 meq/L (ref 3.5–5.3)
SODIUM: 138 meq/L (ref 135–145)
TOTAL PROTEIN: 7.2 g/dL (ref 6.0–8.3)
Total Bilirubin: 1.2 mg/dL (ref 0.2–1.2)

## 2014-04-18 LAB — POC HEMOCCULT BLD/STL (OFFICE/1-CARD/DIAGNOSTIC): FECAL OCCULT BLD: NEGATIVE

## 2014-04-18 LAB — LIPID PANEL
CHOLESTEROL: 180 mg/dL (ref 0–200)
HDL: 52 mg/dL (ref >39)
LDL Cholesterol: 99 mg/dL (ref 0–99)
Total CHOL/HDL Ratio: 3.5 Ratio
Triglycerides: 147 mg/dL (ref <150)
VLDL: 29 mg/dL (ref 0–40)

## 2014-04-18 LAB — CBC
HCT: 43.4 % (ref 36.0–46.0)
Hemoglobin: 14.7 g/dL (ref 12.0–15.0)
MCH: 29.3 pg (ref 26.0–34.0)
MCHC: 33.9 g/dL (ref 30.0–36.0)
MCV: 86.6 fL (ref 78.0–100.0)
MPV: 11 fL (ref 9.4–12.4)
Platelets: 287 10*3/uL (ref 150–400)
RBC: 5.01 MIL/uL (ref 3.87–5.11)
RDW: 15 % (ref 11.5–15.5)
WBC: 7.7 10*3/uL (ref 4.0–10.5)

## 2014-04-18 LAB — HEMOGLOBIN A1C
HEMOGLOBIN A1C: 7.2 % — AB (ref <5.7)
MEAN PLASMA GLUCOSE: 160 mg/dL — AB (ref <117)

## 2014-04-18 LAB — TSH: TSH: 1.488 u[IU]/mL (ref 0.350–4.500)

## 2014-04-18 MED ORDER — HYDROCODONE-ACETAMINOPHEN 5-325 MG PO TABS: ORAL_TABLET | ORAL | Status: DC

## 2014-04-18 MED ORDER — KETOROLAC TROMETHAMINE 60 MG/2ML IM SOLN
60.0000 mg | Freq: Once | INTRAMUSCULAR | Status: AC
  Administered 2014-04-18: 60 mg via INTRAMUSCULAR

## 2014-04-18 MED ORDER — CYCLOBENZAPRINE HCL 10 MG PO TABS: ORAL_TABLET | ORAL | Status: DC

## 2014-04-18 MED ORDER — PREDNISONE (PAK) 5 MG PO TABS: 5.0000 mg | ORAL_TABLET | ORAL | Status: DC

## 2014-04-18 MED ORDER — METHYLPREDNISOLONE ACETATE 80 MG/ML IJ SUSP
80.0000 mg | Freq: Once | INTRAMUSCULAR | Status: AC
  Administered 2014-04-18: 80 mg via INTRAMUSCULAR

## 2014-04-18 NOTE — Assessment & Plan Note (Signed)
Heme negative stool, no mass 

## 2014-04-18 NOTE — Assessment & Plan Note (Signed)
Controlled, no change in medication Patient advised to reduce carb and sweets, commit to regular physical activity, take meds as prescribed, test blood as directed, and attempt to lose weight, to improve blood sugar control.  

## 2014-04-18 NOTE — Assessment & Plan Note (Signed)
Increased and uncontrolled Injections in office and short course of prednisone

## 2014-04-18 NOTE — Progress Notes (Signed)
   Subjective:    Patient ID: Kelly Savage, female    DOB: 11/09/1955, 58 y.o.   MRN: [id number]  HPI The PT is here for follow up and re-evaluation of chronic medical conditions, medication management and review of any available recent lab and radiology data.  Preventive health is updated, specifically  Cancer screening and Immunization.Needs colon screen and eye exam.   Questions or concerns regarding consultations or procedures which the PT has had in the interim are  Addressed.Happy with knee following intra articular injection plans to repeat in April when able to get another The PT denies any adverse reactions to current medications since the last visit.  C/o increased LBP and spasm Denies polyuria, polydipsia, blurred vision , or hypoglycemic episodes.        Review of Systems See HPI Denies recent fever or chills. Denies sinus pressure, nasal congestion, ear pain or sore throat. Denies chest congestion, productive cough or wheezing. Denies chest pains, palpitations and leg swelling Denies abdominal pain, nausea, vomiting,diarrhea or constipation.   Denies dysuria, frequency, hesitancy or incontinence. Increased back pain and spasm in past 4 weeks, improved pain in knee following intra articular injection Denies headaches, seizures, numbness, or tingling. Denies depression, anxiety or insomnia. Denies skin break down or rash.        Objective:   Physical Exam BP 114/64 mmHg  Pulse 93  Resp 18  Ht 5\' 9"  (1.753 m)  Wt 230 lb (104.327 kg)  BMI 33.95 kg/m2  SpO2 96% Patient alert and oriented and in no cardiopulmonary distress.  HEENT: No facial asymmetry, EOMI,   oropharynx pink and moist.  Neck supple no JVD, no mass.  Chest: Clear to auscultation bilaterally.  CVS: S1, S2 no murmurs, no S3.Regular rate.  ABD: Soft non tender.   Ext: No edema  MS: decreased ROM lumbar  Spin with paraspinal spasm, normal in shoulders and  hips and reduced in  knees.  Skin:  Intact, no ulcerations or rash noted.  Psych: Good eye contact, normal affect. Memory intact not anxious or depressed appearing.  CNS: CN 2-12 intact, power,  normal throughout.no focal deficits noted.        Assessment & Plan:  Diabetes mellitus type 2 in obese Controlled, no change in medication Patient advised to reduce carb and sweets, commit to regular physical activity, take meds as prescribed, test blood as directed, and attempt to lose weight, to improve blood sugar control.   Hyperlipidemia with target LDL less than 100 Controlled, no change in medication Hyperlipidemia:Low fat diet discussed and encouraged.    Obesity Improved. Pt applauded on succesful weight loss through lifestyle change, and encouraged to continue same. Weight loss goal set for the next several months.   Special screening for malignant neoplasms, colon Heme negative stool, no mass  Back spasm New is flexeril at bedtime as needed, good back hygiene also encouraged  Knee pain Improved with weight loss and intra articular injections  Back pain, chronic Increased and uncontrolled Injections in office and short course of prednisone

## 2014-04-18 NOTE — Assessment & Plan Note (Signed)
Improved with weight loss and intra articular injections

## 2014-04-18 NOTE — Assessment & Plan Note (Signed)
Controlled, no change in medication Hyperlipidemia:Low fat diet discussed and encouraged.  \ 

## 2014-04-18 NOTE — Assessment & Plan Note (Signed)
Improved. Pt applauded on succesful weight loss through lifestyle change, and encouraged to continue same. Weight loss goal set for the next several months.  

## 2014-04-18 NOTE — Assessment & Plan Note (Signed)
New is flexeril at bedtime as needed, good back hygiene also encouraged

## 2014-04-18 NOTE — Patient Instructions (Addendum)
F/u in 4 month, call if you need me before  Toradol and depomedrol in  office today for back pain  Prednisone 37m dose pack prescribed, take as directed (change in plan)  New for back spasm, is flexeril at bedtime, also remember to practice good back hygiene , sit upright and get good support in the chair you use regularly  Congrats on improved health  Fasting lipid, cmp and EGFr , hBA1C 1c in 4 month  Rectal exam today is normal  You are referred for diabetic eye exam

## 2014-06-06 ENCOUNTER — Telehealth: Payer: Self-pay

## 2014-06-06 NOTE — Telephone Encounter (Signed)
Noted  

## 2014-07-30 ENCOUNTER — Other Ambulatory Visit: Payer: Self-pay | Admitting: Family Medicine

## 2014-07-30 ENCOUNTER — Telehealth: Payer: Self-pay | Admitting: Family Medicine

## 2014-07-30 DIAGNOSIS — Z1231 Encounter for screening mammogram for malignant neoplasm of breast: Secondary | ICD-10-CM

## 2014-07-30 MED ORDER — PREDNISONE (PAK) 5 MG PO TABS: ORAL_TABLET | ORAL | Status: DC

## 2014-07-30 MED ORDER — IBUPROFEN 800 MG PO TABS: 800.0000 mg | ORAL_TABLET | Freq: Two times a day (BID) | ORAL | Status: DC | PRN

## 2014-07-30 NOTE — Addendum Note (Signed)
Addended by: Abner GreenspanHUDY, Meshulem Onorato H on: 07/30/2014 04:10 PM   Modules accepted: Orders

## 2014-07-30 NOTE — Telephone Encounter (Signed)
Left message and meds sent  

## 2014-07-30 NOTE — Telephone Encounter (Signed)
Lower back pain and both her knees (chronic problem)- flexeril makes her sleepy and doesn't really help her pain. Wants something else sent in. Please advise

## 2014-07-30 NOTE — Telephone Encounter (Signed)
pls offer and send pred 5 mg dose pack, and ibuprofen 800mg m one twice daily for 1 week only if she agrees, if not, let me knwo

## 2014-08-03 ENCOUNTER — Ambulatory Visit (HOSPITAL_COMMUNITY)
Admission: RE | Admit: 2014-08-03 | Discharge: 2014-08-03 | Disposition: A | Discharge status: Home / Self Care | Payer: 59 | Source: Ambulatory Visit | Attending: Family Medicine | Admitting: Family Medicine

## 2014-08-03 DIAGNOSIS — Z1231 Encounter for screening mammogram for malignant neoplasm of breast: Secondary | ICD-10-CM | POA: Diagnosis not present

## 2014-08-22 ENCOUNTER — Ambulatory Visit: Payer: 59 | Admitting: Family Medicine

## 2014-08-27 ENCOUNTER — Ambulatory Visit: Payer: 59 | Admitting: Family Medicine

## 2014-09-04 ENCOUNTER — Other Ambulatory Visit: Payer: Self-pay | Admitting: Family Medicine

## 2014-09-06 LAB — HM DIABETES EYE EXAM

## 2014-09-11 ENCOUNTER — Other Ambulatory Visit: Payer: Self-pay

## 2014-09-11 DIAGNOSIS — E119 Type 2 diabetes mellitus without complications: Secondary | ICD-10-CM

## 2014-09-11 MED ORDER — CANAGLIFLOZIN 300 MG PO TABS: 300.0000 mg | ORAL_TABLET | Freq: Every day | ORAL | Status: DC

## 2014-09-12 ENCOUNTER — Telehealth: Payer: Self-pay | Admitting: *Deleted

## 2014-09-12 NOTE — Telephone Encounter (Signed)
Pt called stating her invokana needs to be sent to  out patient pharmacy.

## 2014-09-12 NOTE — Telephone Encounter (Signed)
This has been sent

## 2014-09-26 ENCOUNTER — Ambulatory Visit (INDEPENDENT_AMBULATORY_CARE_PROVIDER_SITE_OTHER): Payer: 59 | Admitting: Family Medicine

## 2014-09-26 ENCOUNTER — Encounter: Payer: Self-pay | Admitting: Family Medicine

## 2014-09-26 VITALS — BP 118/78 | HR 74 | Resp 16 | Ht 69.0 in | Wt 236.0 lb

## 2014-09-26 DIAGNOSIS — E119 Type 2 diabetes mellitus without complications: Secondary | ICD-10-CM

## 2014-09-26 DIAGNOSIS — M549 Dorsalgia, unspecified: Secondary | ICD-10-CM | POA: Diagnosis not present

## 2014-09-26 DIAGNOSIS — M62830 Muscle spasm of back: Secondary | ICD-10-CM

## 2014-09-26 DIAGNOSIS — E1169 Type 2 diabetes mellitus with other specified complication: Secondary | ICD-10-CM

## 2014-09-26 DIAGNOSIS — E669 Obesity, unspecified: Secondary | ICD-10-CM

## 2014-09-26 DIAGNOSIS — G8929 Other chronic pain: Secondary | ICD-10-CM

## 2014-09-26 DIAGNOSIS — E785 Hyperlipidemia, unspecified: Secondary | ICD-10-CM | POA: Diagnosis not present

## 2014-09-26 LAB — COMPLETE METABOLIC PANEL WITH GFR
ALBUMIN: 4.4 g/dL (ref 3.5–5.2)
ALT: 25 U/L (ref 0–35)
AST: 21 U/L (ref 0–37)
Alkaline Phosphatase: 59 U/L (ref 39–117)
BILIRUBIN TOTAL: 1.5 mg/dL — AB (ref 0.2–1.2)
BUN: 15 mg/dL (ref 6–23)
CO2: 25 meq/L (ref 19–32)
Calcium: 9.2 mg/dL (ref 8.4–10.5)
Chloride: 104 mEq/L (ref 96–112)
Creat: 0.72 mg/dL (ref 0.50–1.10)
GFR, Est African American: >89 mL/min
GFR, Est Non African American: >89 mL/min
GLUCOSE: 143 mg/dL — AB (ref 70–99)
POTASSIUM: 4.1 meq/L (ref 3.5–5.3)
SODIUM: 140 meq/L (ref 135–145)
Total Protein: 6.9 g/dL (ref 6.0–8.3)

## 2014-09-26 LAB — HEMOGLOBIN A1C
Hgb A1c MFr Bld: 8 % — ABNORMAL HIGH (ref <5.7)
Mean Plasma Glucose: 183 mg/dL — ABNORMAL HIGH (ref <117)

## 2014-09-26 LAB — LIPID PANEL
CHOL/HDL RATIO: 3.8 ratio
Cholesterol: 184 mg/dL (ref 0–200)
HDL: 49 mg/dL (ref >=46)
LDL Cholesterol: 108 mg/dL — ABNORMAL HIGH (ref 0–99)
TRIGLYCERIDES: 134 mg/dL (ref <150)
VLDL: 27 mg/dL (ref 0–40)

## 2014-09-26 MED ORDER — GLIPIZIDE ER 5 MG PO TB24: 5.0000 mg | ORAL_TABLET | Freq: Every day | ORAL | Status: DC

## 2014-09-26 MED ORDER — HYDROCODONE-ACETAMINOPHEN 5-325 MG PO TABS: ORAL_TABLET | ORAL | Status: DC

## 2014-09-26 MED ORDER — BENAZEPRIL HCL 5 MG PO TABS: 5.0000 mg | ORAL_TABLET | Freq: Every day | ORAL | Status: DC

## 2014-09-26 NOTE — Progress Notes (Signed)
Subjective:    Patient ID: Kelly Savage, female    DOB: 1955-11-16, 59 y.o.   MRN: [id number]  HPI The PT is here for follow up and re-evaluation of chronic medical conditions, medication management and review of any available recent lab and radiology data.  Preventive health is updated, specifically  Cancer screening and Immunization.   Questions or concerns regarding consultations or procedures which the PT has had in the interim are  addressed. The PT denies any adverse reactions to current medications since the last visit.  There are no new concerns.  Frustrated with blood sugar, chronic pain and weight , will work on all 3 and call in sooner with concerns Denies polyuria, polydipsia, blurred vision , or hypoglycemic episodes. Morning sugars remain high     Review of Systems See HPI Denies recent fever or chills. Denies sinus pressure, nasal congestion, ear pain or sore throat. Denies chest congestion, productive cough or wheezing. Denies chest pains, palpitations and leg swelling Denies abdominal pain, nausea, vomiting,diarrhea or constipation.   Denies dysuria, frequency, hesitancy or incontinence. Denies headaches, seizures, numbness, or tingling. Denies depression, anxiety or insomnia. Denies skin break down or rash.        Objective:   Physical Exam BP 118/78 mmHg  Pulse 74  Resp 16  Ht  (1.753 m)  Wt 236 lb (107.049 kg)  BMI 34.84 kg/m2  SpO2 98% Patient alert and oriented and in no cardiopulmonary distress.  HEENT: No facial asymmetry, EOMI,   oropharynx pink and moist.  Neck supple no JVD, no mass.  Chest: Clear to auscultation bilaterally.  CVS: S1, S2 no murmurs, no S3.Regular rate.  ABD: Soft non tender.   Ext: No edema  MS: decreased ROM spine,  hips and knees.  Skin: Intact, no ulcerations or rash noted.  Psych: Good eye contact, normal affect. Memory intact not anxious or depressed appearing.  CNS: CN 2-12 intact, power,  normal  throughout.no focal deficits noted.        Assessment & Plan:  Diabetes mellitus type 2 in obese Deteriorated.add glipizide Patient educated about the importance of limiting  Carbohydrate intake , the need to commit to daily physical activity for a minimum of 30 minutes , and to commit weight loss. The fact that changes in all these areas will reduce or eliminate all together the development of diabetes is stressed.   Diabetic Labs Latest Ref Rng 09/25/2014 04/17/2014 11/07/2013 06/16/2013 01/27/2013  HbA1c <5.7 % 8.0(H) 7.2(H) 7.6(H) 7.6(H) 8.9(H)  Microalbumin 0.00 - 1.89 mg/dL - - 4.09 - -  Micro/Creat Ratio 0.0 - 30.0 mg/g - - 6.7 - -  Chol 0 - 200 mg/dL 811 914 - 782 -  HDL >=95 mg/dL 49 52 - 47 -  Calc LDL 0 - 99 mg/dL 621(H) 99 - 086(V) -  Triglycerides <150 mg/dL 784 696 - 295 -  Creatinine 0.50 - 1.10 mg/dL 2.84 1.32 4.40 1.02 7.25   BP/Weight 09/26/2014 04/18/2014 11/15/2013 07/17/2013 02/20/2013 01/27/2013 09/20/2012  Systolic BP 118 114 110 120 - 366 130  Diastolic BP 78 64 70 84 - 82 88  Wt. (Lbs) 236 230 235.04 241 243 253.12 260  BMI 34.84 33.95 34.69 35.57 35.87 37.36 38.38   Foot/eye exam completion dates Latest Ref Rng 09/06/2014 11/15/2013  Eye Exam No Retinopathy No Retinopathy -  Foot exam Order - - -  Foot Form Completion - - Done       Hyperlipidemia with target LDL less than  100 Deteriorated, no med change Hyperlipidemia:Low fat diet discussed and encouraged.   Lipid Panel  Lab Results  Component Value Date   CHOL 184 09/25/2014   HDL 49 09/25/2014   LDLCALC 108* 09/25/2014   TRIG 134 09/25/2014   CHOLHDL 3.8 09/25/2014      Updated lab needed at/ before next visit.   Back pain, chronic increased and uncontrolled, add bedtime gabapentin, continue hydrocodone as before  Obesity Deteriorated. Patient re-educated about  the importance of commitment to a  minimum of 150 minutes of exercise per week.  The importance of healthy food choices with  portion control discussed. Encouraged to start a food diary, count calories and to consider  joining a support group. Sample diet sheets offered. Goals set by the patient for the next several months.   Weight /BMI 09/26/2014 04/18/2014 11/15/2013  WEIGHT 236 lb 230 lb 235 lb 0.6 oz  HEIGHT 5\' 9"  5\' 9"  5\' 9"   BMI 34.84 kg/m2 33.95 kg/m2 34.69 kg/m2    Current exercise per week 90 minutes.

## 2014-09-26 NOTE — Patient Instructions (Signed)
F/u in 3 month, call if you neesd m before  Fasting lipid, cmp and eGFr and hBa1C in 3 months  New for diabetes additionally are glipizid one with breakfast, and benazepril for kidney protection  Tylenol 582m one to  two at bedtime  For osteoarthritis, stretching exercise , lots of veges\\  Please work on good  health habits so that your health will improve. 1. Commitment to daily physical activity for 30 to 60  minutes, if you are able to do this.  2. Commitment to wise food choices. Aim for half of your  food intake to be vegetable and fruit, one quarter starchy foods, and one quarter protein. Try to eat on a regular schedule  3 meals per day, snacking between meals should be limited to vegetables or fruits or small portions of nuts. 64 ounces of water per day is generally recommended, unless you have specific health conditions, like heart failure or kidney failure where you will need to limit fluid intake.  3. Commitment to sufficient and a  good quality of physical and mental rest daily, generally between 6 to 8 hours per day.  WITH PERSISTANCE AND PERSEVERANCE, THE IMPOSSIBLE , BECOMES THE NORM!  [facility] consider it a privelige to serve you.

## 2014-10-13 NOTE — Assessment & Plan Note (Signed)
Deteriorated.add glipizide Patient educated about the importance of limiting  Carbohydrate intake , the need to commit to daily physical activity for a minimum of 30 minutes , and to commit weight loss. The fact that changes in all these areas will reduce or eliminate all together the development of diabetes is stressed.   Diabetic Labs Latest Ref Rng 09/25/2014 04/17/2014 11/07/2013 06/16/2013 01/27/2013  HbA1c <5.7 % 8.0(H) 7.2(H) 7.6(H) 7.6(H) 8.9(H)  Microalbumin 0.00 - 1.89 mg/dL - - 2.70 - -  Micro/Creat Ratio 0.0 - 30.0 mg/g - - 6.7 - -  Chol 0 - 200 mg/dL 786 754 - 492 -  HDL >=01 mg/dL 49 52 - 47 -  Calc LDL 0 - 99 mg/dL 007(H) 99 - 219(X) -  Triglycerides <150 mg/dL 588 325 - 498 -  Creatinine 0.50 - 1.10 mg/dL 2.64 1.58 3.09 4.07 6.80   BP/Weight 09/26/2014 04/18/2014 11/15/2013 07/17/2013 02/20/2013 01/27/2013 09/20/2012  Systolic BP 118 114 110 120 - 881 130  Diastolic BP 78 64 70 84 - 82 88  Wt. (Lbs) 236 230 235.04 241 243 253.12 260  BMI 34.84 33.95 34.69 35.57 35.87 37.36 38.38   Foot/eye exam completion dates Latest Ref Rng 09/06/2014 11/15/2013  Eye Exam No Retinopathy No Retinopathy -  Foot exam Order - - -  Foot Form Completion - - Done

## 2014-10-13 NOTE — Assessment & Plan Note (Signed)
Deteriorated. Patient re-educated about  the importance of commitment to a  minimum of 150 minutes of exercise per week.  The importance of healthy food choices with portion control discussed. Encouraged to start a food diary, count calories and to consider  joining a support group. Sample diet sheets offered. Goals set by the patient for the next several months.   Weight /BMI 09/26/2014 04/18/2014 11/15/2013  WEIGHT 236 lb 230 lb 235 lb 0.6 oz  HEIGHT 5\' 9"  5\' 9"  5\' 9"   BMI 34.84 kg/m2 33.95 kg/m2 34.69 kg/m2    Current exercise per week 90 minutes.

## 2014-10-13 NOTE — Assessment & Plan Note (Signed)
Tylenol and back strengthening exercises ads well as good posture discussed

## 2014-10-13 NOTE — Assessment & Plan Note (Signed)
Deteriorated, no med change Hyperlipidemia:Low fat diet discussed and encouraged.   Lipid Panel  Lab Results  Component Value Date   CHOL 184 09/25/2014   HDL 49 09/25/2014   LDLCALC 108* 09/25/2014   TRIG 134 09/25/2014   CHOLHDL 3.8 09/25/2014      Updated lab needed at/ before next visit.

## 2014-10-13 NOTE — Assessment & Plan Note (Signed)
increased and uncontrolled, add bedtime gabapentin, continue hydrocodone as before

## 2014-12-04 ENCOUNTER — Other Ambulatory Visit: Payer: Self-pay | Admitting: Family Medicine

## 2014-12-31 ENCOUNTER — Ambulatory Visit: Payer: 59 | Admitting: Family Medicine

## 2015-01-18 ENCOUNTER — Other Ambulatory Visit: Payer: Self-pay | Admitting: Family Medicine

## 2015-01-29 ENCOUNTER — Other Ambulatory Visit: Payer: Self-pay | Admitting: Family Medicine

## 2015-01-30 ENCOUNTER — Ambulatory Visit (INDEPENDENT_AMBULATORY_CARE_PROVIDER_SITE_OTHER): Payer: 59 | Admitting: Family Medicine

## 2015-01-30 ENCOUNTER — Encounter: Payer: Self-pay | Admitting: Family Medicine

## 2015-01-30 VITALS — BP 118/80 | HR 81 | Resp 16 | Ht 69.0 in | Wt 234.0 lb

## 2015-01-30 DIAGNOSIS — E559 Vitamin D deficiency, unspecified: Secondary | ICD-10-CM | POA: Diagnosis not present

## 2015-01-30 DIAGNOSIS — M62830 Muscle spasm of back: Secondary | ICD-10-CM | POA: Diagnosis not present

## 2015-01-30 DIAGNOSIS — Z23 Encounter for immunization: Secondary | ICD-10-CM | POA: Diagnosis not present

## 2015-01-30 DIAGNOSIS — M25569 Pain in unspecified knee: Secondary | ICD-10-CM

## 2015-01-30 DIAGNOSIS — E119 Type 2 diabetes mellitus without complications: Secondary | ICD-10-CM | POA: Diagnosis not present

## 2015-01-30 DIAGNOSIS — E1169 Type 2 diabetes mellitus with other specified complication: Secondary | ICD-10-CM

## 2015-01-30 DIAGNOSIS — E785 Hyperlipidemia, unspecified: Secondary | ICD-10-CM

## 2015-01-30 DIAGNOSIS — E669 Obesity, unspecified: Secondary | ICD-10-CM

## 2015-01-30 DIAGNOSIS — M549 Dorsalgia, unspecified: Secondary | ICD-10-CM

## 2015-01-30 DIAGNOSIS — G8929 Other chronic pain: Secondary | ICD-10-CM

## 2015-01-30 LAB — COMPLETE METABOLIC PANEL WITH GFR
ALT: 22 U/L (ref 6–29)
AST: 20 U/L (ref 10–35)
Albumin: 4.1 g/dL (ref 3.6–5.1)
Alkaline Phosphatase: 56 U/L (ref 33–130)
BUN: 17 mg/dL (ref 7–25)
CHLORIDE: 103 mmol/L (ref 98–110)
CO2: 27 mmol/L (ref 20–31)
CREATININE: 0.71 mg/dL (ref 0.50–1.05)
Calcium: 9.5 mg/dL (ref 8.6–10.4)
GFR, Est Non African American: >89 mL/min (ref >=60)
GLUCOSE: 73 mg/dL (ref 65–99)
Potassium: 4.3 mmol/L (ref 3.5–5.3)
Sodium: 137 mmol/L (ref 135–146)
Total Bilirubin: 1.1 mg/dL (ref 0.2–1.2)
Total Protein: 6.8 g/dL (ref 6.1–8.1)

## 2015-01-30 LAB — LIPID PANEL
CHOL/HDL RATIO: 2.7 ratio (ref <=5.0)
Cholesterol: 148 mg/dL (ref 125–200)
HDL: 55 mg/dL (ref >=46)
LDL Cholesterol: 79 mg/dL (ref <130)
Triglycerides: 71 mg/dL (ref <150)
VLDL: 14 mg/dL (ref <30)

## 2015-01-30 LAB — HEMOGLOBIN A1C
HEMOGLOBIN A1C: 7.7 % — AB (ref <5.7)
Mean Plasma Glucose: 174 mg/dL — ABNORMAL HIGH (ref <117)

## 2015-01-30 LAB — HIV ANTIBODY (ROUTINE TESTING W REFLEX): HIV: NONREACTIVE

## 2015-01-30 MED ORDER — METHOCARBAMOL 500 MG PO TABS: 500.0000 mg | ORAL_TABLET | Freq: Three times a day (TID) | ORAL | Status: DC | PRN

## 2015-01-30 MED ORDER — ONETOUCH DELICA LANCETS 33G MISC: Status: DC

## 2015-01-30 MED ORDER — HYDROCODONE-ACETAMINOPHEN 5-325 MG PO TABS: ORAL_TABLET | ORAL | Status: DC

## 2015-01-30 MED ORDER — GLIPIZIDE ER 2.5 MG PO TB24: 2.5000 mg | ORAL_TABLET | Freq: Every day | ORAL | Status: DC

## 2015-01-30 MED ORDER — GLUCOSE BLOOD VI STRP: ORAL_STRIP | Status: DC

## 2015-01-30 NOTE — Progress Notes (Signed)
Subjective:    Patient ID: Kelly Savage, female    DOB: 25-Oct-1955, 59 y.o.   MRN: [id number]  HPI   Kelly Savage     MRN: [id number]      DOB: 1955/08/05   HPI Kelly Savage is here for follow up and re-evaluation of chronic medical conditions, medication management and review of any available recent lab and radiology data.  Preventive health is updated, specifically  Cancer screening and Immunization.   Questions or concerns regarding consultations or procedures which the PT has had in the interim are  addressed. The PT denies any adverse reactions to current medications since the last visit.  Denies polyuria, polydipsia, blurred vision , or hypoglycemic episodes. Fasting blood sugars have improved.States knees are good, holding off of intra articular injection at this time C/o low back stiffness when first arising and after sitting for over 5 minutes. Will commit to home exercises , does not want to pursue Pt currently   ROS Denies recent fever or chills. Denies sinus pressure, nasal congestion, ear pain or sore throat. Denies chest congestion, productive cough or wheezing. Denies chest pains, palpitations and leg swelling Denies abdominal pain, nausea, vomiting,diarrhea or constipation.   Denies dysuria, frequency, hesitancy or incontinence. Chronic joint pain,  and limitation in mobility due to stiffness in lower back in particular when she initially starts moving. Denies headaches, seizures, numbness, or tingling. Denies depression, anxiety or insomnia. Denies skin break down or rash.   PE  BP 118/80 mmHg  Pulse 81  Resp 16  Ht  (1.753 m)  Wt 234 lb (106.142 kg)  BMI 34.54 kg/m2  SpO2 98%  Patient alert and oriented and in no cardiopulmonary distress.  HEENT: No facial asymmetry, EOMI,   oropharynx pink and moist.  Neck supple no JVD, no mass.  Chest: Clear to auscultation bilaterally.  CVS: S1, S2 no murmurs, no S3.Regular rate.  ABD: Soft non tender.    Ext: No edema  MS: Adequate though reduced  ROM lumbar spine,wih spasm, normal ROM in shoulders, hips and knees.  Skin: Intact, no ulcerations or rash noted.  Psych: Good eye contact, normal affect. Memory intact not anxious or depressed appearing.  CNS: CN 2-12 intact, power,  normal throughout.no focal deficits noted.   Assessment & Plan   Diabetes mellitus type 2 in obese Kelly Savage is reminded of the importance of commitment to daily physical activity for 30 minutes or more, as able and the need to limit carbohydrate intake to 30 to 60 grams per meal to help with blood sugar control.   The need to take medication as prescribed, test blood sugar as directed, and to call between visits if there is a concern that blood sugar is uncontrolled is also discussed.   Kelly Savage is reminded of the importance of daily foot exam, annual eye examination, and good blood sugar, blood pressure and cholesterol control. Improved, add glipizide 2.5 mg more  Diabetic Labs Latest Ref Rng 01/30/2015 01/29/2015 09/25/2014 04/17/2014 11/07/2013  HbA1c <5.7 % - 7.7(H) 8.0(H) 7.2(H) 7.6(H)  Microalbumin <2.0 mg/dL 0.5 - - - 7.82  Micro/Creat Ratio 0.0 - 30.0 mg/g 5.2 - - - 6.7  Chol 125 - 200 mg/dL - 956 213 086 -  HDL >=57 mg/dL - 55 49 52 -  Calc LDL <130 mg/dL - 79 846(N) 99 -  Triglycerides <150 mg/dL - 71 629 528 -  Creatinine 0.50 - 1.05 mg/dL - 4.13 2.44 0.10 2.72  BP/Weight 01/30/2015 09/26/2014 04/18/2014 11/15/2013 07/17/2013 02/20/2013 01/27/2013  Systolic BP 118 118 114 110 120 - 120  Diastolic BP 80 78 64 70 84 - 82  Wt. (Lbs) 234 236 230 235.04 241 243 253.12  BMI 34.54 34.84 33.95 34.69 35.57 35.87 37.36   Foot/eye exam completion dates Latest Ref Rng 01/30/2015 09/06/2014  Eye Exam No Retinopathy - No Retinopathy  Foot exam Order - - -  Foot Form Completion - Done -         Back spasm Uncontrolled , trial of robaxin  Back pain, chronic Unchanged, no change in  medication  Hyperlipidemia with target LDL less than 100 Hyperlipidemia:Low fat diet discussed and encouraged.   Lipid Panel  Lab Results  Component Value Date   CHOL 148 01/29/2015   HDL 55 01/29/2015   LDLCALC 79 01/29/2015   TRIG 71 01/29/2015   CHOLHDL 2.7 01/29/2015      Controlled, no change in medication   Obesity Slightly improved Patient re-educated about  the importance of commitment to a  minimum of 150 minutes of exercise per week.  The importance of healthy food choices with portion control discussed. Encouraged to start a food diary, count calories and to consider  joining a support group. Sample diet sheets offered. Goals set by the patient for the next several months.   Weight /BMI 01/30/2015 09/26/2014 04/18/2014  WEIGHT 234 lb 236 lb 230 lb  HEIGHT     BMI 34.54 kg/m2 34.84 kg/m2 33.95 kg/m2    Current exercise per week 90 minutes.   Need for prophylactic vaccination and inoculation against influenza After obtaining informed consent, the vaccine is  administered by LPN.   Knee pain Improved and controlled, no intra articular injection needed curently      Review of Systems     Objective:   Physical Exam        Assessment & Plan:

## 2015-01-30 NOTE — Patient Instructions (Addendum)
CPE in 3 months, call if you need me before  Microalb , foot exam and flu vaccine today   NEW additional glipiziide ER 2.5 mg daily, so nEW total daily dose glipizide is 7.5 mg daily at breakfast  Goal for fasting blood sugar ranges from 80 to 130 and 2 hours after any meal or at bedtime should be between 130 to 180.  New medication robaxin for muscle spasm, may take up to 3 times daily if needed  Please work on good  health habits so that your health will improve. 1. Commitment to daily physical activity for 30 to 60  minutes, if you are able to do this.  2. Commitment to wise food choices. Aim for half of your  food intake to be vegetable and fruit, one quarter starchy foods, and one quarter protein. Try to eat on a regular schedule  3 meals per day, snacking between meals should be limited to vegetables or fruits or small portions of nuts. 64 ounces of water per day is generally recommended, unless you have specific health conditions, like heart failure or kidney failure where you will need to limit fluid intake.  3. Commitment to sufficient and a  good quality of physical and mental rest daily, generally between 6 to 8 hours per day.  WITH PERSISTANCE AND PERSEVERANCE, THE IMPOSSIBLE , BECOMES THE NORM!  Thanks for choosing [facility], we consider it a privelige to serve you.   Non fast hBa1C, chem 7 and EGFR, cBC, tSH, Vit D in 3 month

## 2015-01-31 LAB — HEPATITIS C ANTIBODY: HCV Ab: NEGATIVE

## 2015-01-31 LAB — MICROALBUMIN / CREATININE URINE RATIO
Creatinine, Urine: 97 mg/dL
Microalb Creat Ratio: 5.2 mg/g (ref 0.0–30.0)
Microalb, Ur: 0.5 mg/dL (ref <2.0)

## 2015-02-01 DIAGNOSIS — Z23 Encounter for immunization: Secondary | ICD-10-CM | POA: Insufficient documentation

## 2015-02-01 NOTE — Assessment & Plan Note (Signed)
Uncontrolled , trial of robaxin

## 2015-02-01 NOTE — Assessment & Plan Note (Signed)
Kelly Savage is reminded of the importance of commitment to daily physical activity for 30 minutes or more, as able and the need to limit carbohydrate intake to 30 to 60 grams per meal to help with blood sugar control.   The need to take medication as prescribed, test blood sugar as directed, and to call between visits if there is a concern that blood sugar is uncontrolled is also discussed.   Kelly Savage is reminded of the importance of daily foot exam, annual eye examination, and good blood sugar, blood pressure and cholesterol control. Improved, add glipizide 2.5 mg more  Diabetic Labs Latest Ref Rng 01/30/2015 01/29/2015 09/25/2014 04/17/2014 11/07/2013  HbA1c <5.7 % - 7.7(H) 8.0(H) 7.2(H) 7.6(H)  Microalbumin <2.0 mg/dL 0.5 - - - 4.54  Micro/Creat Ratio 0.0 - 30.0 mg/g 5.2 - - - 6.7  Chol 125 - 200 mg/dL - 098 119 147 -  HDL >=82 mg/dL - 55 49 52 -  Calc LDL <130 mg/dL - 79 956(O) 99 -  Triglycerides <150 mg/dL - 71 130 865 -  Creatinine 0.50 - 1.05 mg/dL - 7.84 6.96 2.95 2.84   BP/Weight 01/30/2015 09/26/2014 04/18/2014 11/15/2013 07/17/2013 02/20/2013 01/27/2013  Systolic BP 118 118 114 110 120 - 120  Diastolic BP 80 78 64 70 84 - 82  Wt. (Lbs) 234 236 230 235.04 241 243 253.12  BMI 34.54 34.84 33.95 34.69 35.57 35.87 37.36   Foot/eye exam completion dates Latest Ref Rng 01/30/2015 09/06/2014  Eye Exam No Retinopathy - No Retinopathy  Foot exam Order - - -  Foot Form Completion - Done -

## 2015-02-01 NOTE — Assessment & Plan Note (Signed)
Improved and controlled, no intra articular injection needed curently

## 2015-02-01 NOTE — Assessment & Plan Note (Signed)
After obtaining informed consent, the vaccine is  administered by LPN.  

## 2015-02-01 NOTE — Assessment & Plan Note (Addendum)
Slightly improved Patient re-educated about  the importance of commitment to a  minimum of 150 minutes of exercise per week.  The importance of healthy food choices with portion control discussed. Encouraged to start a food diary, count calories and to consider  joining a support group. Sample diet sheets offered. Goals set by the patient for the next several months.   Weight /BMI 01/30/2015 09/26/2014 04/18/2014  WEIGHT 234 lb 236 lb 230 lb  HEIGHT     BMI 34.54 kg/m2 34.84 kg/m2 33.95 kg/m2    Current exercise per week 90 minutes.

## 2015-02-01 NOTE — Assessment & Plan Note (Signed)
Unchanged, no change in medication 

## 2015-02-01 NOTE — Assessment & Plan Note (Signed)
Hyperlipidemia:Low fat diet discussed and encouraged.   Lipid Panel  Lab Results  Component Value Date   CHOL 148 01/29/2015   HDL 55 01/29/2015   LDLCALC 79 01/29/2015   TRIG 71 01/29/2015   CHOLHDL 2.7 01/29/2015      Controlled, no change in medication

## 2015-05-14 DIAGNOSIS — M170 Bilateral primary osteoarthritis of knee: Secondary | ICD-10-CM | POA: Diagnosis not present

## 2015-05-21 DIAGNOSIS — M170 Bilateral primary osteoarthritis of knee: Secondary | ICD-10-CM | POA: Diagnosis not present

## 2015-05-28 ENCOUNTER — Encounter: Payer: 59 | Admitting: Family Medicine

## 2015-05-28 DIAGNOSIS — M170 Bilateral primary osteoarthritis of knee: Secondary | ICD-10-CM | POA: Diagnosis not present

## 2015-05-30 ENCOUNTER — Other Ambulatory Visit: Payer: Self-pay | Admitting: Family Medicine

## 2015-05-30 MED FILL — glipiZIDE ER 2.5 MG TB24: 2.5 | 90 days supply | Qty: 90 | Fill #1

## 2015-05-30 MED FILL — glipiZIDE ER 5 MG TB24: 5 | 90 days supply | Qty: 90 | Fill #1

## 2015-05-30 MED FILL — INVOKANA 300 MG TABLET: 300 | 90 days supply | Qty: 90 | Fill #3

## 2015-05-31 ENCOUNTER — Other Ambulatory Visit: Payer: Self-pay | Admitting: Family Medicine

## 2015-05-31 MED FILL — PRAVASTATIN NA 40 MG TAB: 40 | 90 days supply | Qty: 90 | Fill #0

## 2015-05-31 MED FILL — JANUMET XR 50-1,000 MG TAB: 50-1000 | 90 days supply | Qty: 180 | Fill #2

## 2015-05-31 MED FILL — BENAZEPRIL HCL 10 MG TABLET: 10 | 90 days supply | Qty: 45 | Fill #0

## 2015-07-08 ENCOUNTER — Telehealth: Payer: Self-pay

## 2015-07-08 MED ORDER — FIRST-DUKES MOUTHWASH MT SUSP: OROMUCOSAL | Status: DC

## 2015-07-08 NOTE — Telephone Encounter (Signed)
Yes pls send.Script is printed. Also pls ask her to get labs ordered from Sept no later than tomtrrow if possible, her appt is on 3/8, thanks

## 2015-07-08 NOTE — Telephone Encounter (Signed)
Medication sent and patient aware  

## 2015-07-09 DIAGNOSIS — E559 Vitamin D deficiency, unspecified: Secondary | ICD-10-CM | POA: Diagnosis not present

## 2015-07-09 DIAGNOSIS — E669 Obesity, unspecified: Secondary | ICD-10-CM | POA: Diagnosis not present

## 2015-07-09 DIAGNOSIS — E119 Type 2 diabetes mellitus without complications: Secondary | ICD-10-CM | POA: Diagnosis not present

## 2015-07-09 LAB — CBC
HEMATOCRIT: 42.2 % (ref 36.0–46.0)
HEMOGLOBIN: 15.2 g/dL — AB (ref 12.0–15.0)
MCH: 31.1 pg (ref 26.0–34.0)
MCHC: 36 g/dL (ref 30.0–36.0)
MCV: 86.5 fL (ref 78.0–100.0)
MPV: 11.6 fL (ref 8.6–12.4)
Platelets: 263 10*3/uL (ref 150–400)
RBC: 4.88 MIL/uL (ref 3.87–5.11)
RDW: 14.1 % (ref 11.5–15.5)
WBC: 8.4 10*3/uL (ref 4.0–10.5)

## 2015-07-10 ENCOUNTER — Ambulatory Visit (INDEPENDENT_AMBULATORY_CARE_PROVIDER_SITE_OTHER): Payer: 59 | Admitting: Family Medicine

## 2015-07-10 ENCOUNTER — Encounter: Payer: Self-pay | Admitting: Family Medicine

## 2015-07-10 ENCOUNTER — Other Ambulatory Visit (HOSPITAL_COMMUNITY)
Admission: RE | Admit: 2015-07-10 | Discharge: 2015-07-10 | Disposition: A | Discharge status: Home / Self Care | Payer: 59 | Source: Ambulatory Visit | Attending: Family Medicine | Admitting: Family Medicine

## 2015-07-10 VITALS — BP 112/78 | HR 97 | Resp 18 | Ht 69.0 in | Wt 236.0 lb

## 2015-07-10 DIAGNOSIS — M62830 Muscle spasm of back: Secondary | ICD-10-CM

## 2015-07-10 DIAGNOSIS — G8929 Other chronic pain: Secondary | ICD-10-CM

## 2015-07-10 DIAGNOSIS — M549 Dorsalgia, unspecified: Secondary | ICD-10-CM

## 2015-07-10 DIAGNOSIS — E1169 Type 2 diabetes mellitus with other specified complication: Secondary | ICD-10-CM

## 2015-07-10 DIAGNOSIS — E119 Type 2 diabetes mellitus without complications: Secondary | ICD-10-CM

## 2015-07-10 DIAGNOSIS — Z01419 Encounter for gynecological examination (general) (routine) without abnormal findings: Secondary | ICD-10-CM | POA: Diagnosis not present

## 2015-07-10 DIAGNOSIS — E669 Obesity, unspecified: Secondary | ICD-10-CM

## 2015-07-10 DIAGNOSIS — Z1211 Encounter for screening for malignant neoplasm of colon: Secondary | ICD-10-CM

## 2015-07-10 DIAGNOSIS — K121 Other forms of stomatitis: Secondary | ICD-10-CM

## 2015-07-10 DIAGNOSIS — Z124 Encounter for screening for malignant neoplasm of cervix: Secondary | ICD-10-CM | POA: Diagnosis not present

## 2015-07-10 DIAGNOSIS — E785 Hyperlipidemia, unspecified: Secondary | ICD-10-CM

## 2015-07-10 DIAGNOSIS — Z0000 Encounter for general adult medical examination without abnormal findings: Secondary | ICD-10-CM | POA: Diagnosis not present

## 2015-07-10 LAB — COMPLETE METABOLIC PANEL WITH GFR
ALBUMIN: 4.4 g/dL (ref 3.6–5.1)
ALK PHOS: 58 U/L (ref 33–130)
ALT: 20 U/L (ref 6–29)
AST: 19 U/L (ref 10–35)
BILIRUBIN TOTAL: 1.9 mg/dL — AB (ref 0.2–1.2)
BUN: 19 mg/dL (ref 7–25)
CO2: 26 mmol/L (ref 20–31)
CREATININE: 0.85 mg/dL (ref 0.50–1.05)
Calcium: 9.6 mg/dL (ref 8.6–10.4)
Chloride: 98 mmol/L (ref 98–110)
GFR, EST AFRICAN AMERICAN: 87 mL/min (ref >=60)
GFR, EST NON AFRICAN AMERICAN: 75 mL/min (ref >=60)
Glucose, Bld: 90 mg/dL (ref 65–99)
Potassium: 4 mmol/L (ref 3.5–5.3)
Sodium: 137 mmol/L (ref 135–146)
Total Protein: 7 g/dL (ref 6.1–8.1)

## 2015-07-10 LAB — POC HEMOCCULT BLD/STL (OFFICE/1-CARD/DIAGNOSTIC): Fecal Occult Blood, POC: NEGATIVE

## 2015-07-10 LAB — HEMOGLOBIN A1C
Hgb A1c MFr Bld: 7.3 % — ABNORMAL HIGH (ref <5.7)
Mean Plasma Glucose: 163 mg/dL — ABNORMAL HIGH (ref <117)

## 2015-07-10 LAB — TSH: TSH: 2.74 mIU/L

## 2015-07-10 LAB — VITAMIN D 25 HYDROXY (VIT D DEFICIENCY, FRACTURES): Vit D, 25-Hydroxy: 26 ng/mL — ABNORMAL LOW (ref 30–100)

## 2015-07-10 MED ORDER — ERGOCALCIFEROL 1.25 MG (50000 UT) PO CAPS: 50000.0000 [IU] | ORAL_CAPSULE | ORAL | Status: DC

## 2015-07-10 MED ORDER — GABAPENTIN 300 MG PO CAPS: 300.0000 mg | ORAL_CAPSULE | Freq: Every day | ORAL | Status: DC

## 2015-07-10 MED ORDER — HYDROCODONE-ACETAMINOPHEN 5-325 MG PO TABS: ORAL_TABLET | ORAL | Status: DC

## 2015-07-10 MED ORDER — CEPHALEXIN 500 MG PO CAPS: 500.0000 mg | ORAL_CAPSULE | Freq: Four times a day (QID) | ORAL | Status: DC

## 2015-07-10 MED FILL — GABAPENTIN 300 MG CAPSULE: 300 | 90 days supply | Qty: 90 | Fill #0

## 2015-07-10 MED FILL — CEPHALEXIN 500 MG CAPSULE: 500 | 5 days supply | Qty: 20 | Fill #0

## 2015-07-10 MED FILL — VIT D2 1.25 MG (50,000 UNIT: 1.25 MG | 84 days supply | Qty: 12 | Fill #0

## 2015-07-10 NOTE — Patient Instructions (Addendum)
F/u in 4 month, call if you need me sooner  CONGRATS on improved health and lifestyle  INCREASE glipizide, take glipizide XL 5 mg TWO every morning, or 57m one AND TWO 2.5 mg tablets , so total dose is 10 mg one every morning  Goal for fasting blood sugar ranges from 80 to 130 and 2 hours after any meal or at bedtime should be between 130 to 170.   Call and send me  Message last week in March if blood sugar is doing great with this, Dose wiill be formally changed to 10 mg ONE every morning Qustions/ concerns , please call!  New is gabapentin one at night for back pain and spasm Continue hydrocodone as before, script provided today  New is once weekly vitamin D, stop daily  Vit D  Non fast hBA1C, chem 7 and EGFR for next visit please  Please work on good  health habits so that your health will improve. 1. Commitment to daily physical activity for 30 to 60  minutes, if you are able to do this.  2. Commitment to wise food choices. Aim for half of your  food intake to be vegetable and fruit, one quarter starchy foods, and one quarter protein. Try to eat on a regular schedule  3 meals per day, snacking between meals should be limited to vegetables or fruits or small portions of nuts. 64 ounces of water per day is generally recommended, unless you have specific health conditions, like heart failure or kidney failure where you will need to limit fluid intake.  3. Commitment to sufficient and a  good quality of physical and mental rest daily, generally between 6 to 8 hours per day.  WITH PERSISTANCE AND PERSEVERANCE, THE IMPOSSIBLE , BECOMES THE NORM! Thanks for choosing [facility] we consider it a privelige to serve you.

## 2015-07-10 NOTE — Progress Notes (Signed)
Subjective:    Patient ID: Kelly Savage, female    DOB: [birth year], 60 y.o.   MRN: [id number]  HPI Patient is in for annual physical exam. 5 day h/o painful ulcer on left side of undersurface of tongue, no recall of direct trauma, uncertain if pineapple was trigger, no nicotine use. Immunization is reviewed , and  updated if needed. Labs are reviewed and med change made in diabetes managment C/o no change in back spasm with robaxin , stopped this, wonders if lyrica would be beneficial. Will start trial of gabapentin   Review of Systems See HPI     Objective:   Physical Exam  BP 112/78 mmHg  Pulse 97  Resp 18  Ht  (1.753 m)  Wt 236 lb (107.049 kg)  BMI 34.84 kg/m2  SpO2 97%   Pleasant well nourished female, alert and oriented x 3, in no cardio-pulmonary distress. Afebrile. HEENT No facial trauma or asymetry. Sinuses non tender.  Extra occullar muscles intact, pupils equally reactive to light. External ears normal, tympanic membranes clear. Oropharynx moist, no exudate, good dentition.Oral ulcer diameter approx 3.5 cm, purulent appearing border Neck: supple, left anterior cervical adenopathy,JVD or thyromegaly.No bruits.  Chest: Clear to ascultation bilaterally.No crackles or wheezes. Non tender to palpation  Breast: No asymetry,no masses or lumps. No tenderness. No nipple discharge or inversion. No axillary or supraclavicular adenopathy  Cardiovascular system; Heart sounds normal,  S1 and  S2 ,no S3.  No murmur, or thrill. Apical beat not displaced Peripheral pulses normal.  Abdomen: Soft, non tender, no organomegaly or masses. No bruits. Bowel sounds normal. No guarding, tenderness or rebound.  Rectal:  Normal sphincter tone. No mass.No rectal masses.  Guaiac negative stool.  GU: External genitalia normal female genitalia , female distribution of hair. No lesions. Urethral meatus normal in size, no  Prolapse, no lesions visibly  Present. Bladder  non tender. Vagina pink and moist , with no visible lesions , discharge present . Adequate pelvic support no  cystocele or rectocele noted Cervix pink and appears healthy, no lesions or ulcerations noted, no discharge noted from os Uterus normal size, no adnexal masses, no cervical motion or adnexal tenderness.   MS: decreased ROM of spine, normal in hips , shoulders and knees. No deformity ,swelling or crepitus noted. No muscle wasting or atrophy.   Neurologic: Cranial nerves 2 to 12 intact. Power, tone ,sensation and reflexes normal throughout. No disturbance in gait. No tremor.  Skin: Intact, no ulceration, erythema , scaling or rash noted. Pigmentation normal throughout  Psych; Normal mood and affect. Judgement and concentration normal        Assessment & Plan:  Annual physical exam Annual exam as documented. Counseling done  re healthy lifestyle involving commitment to 150 minutes exercise per week, heart healthy diet, and attaining healthy weight.The importance of adequate sleep also discussed. Regular seat belt use and home safety, is also discussed. Changes in health habits are decided on by the patient with goals and time frames  set for achieving them. Immunization and cancer screening needs are specifically addressed at this visit.   Diabetes mellitus type 2 in obese Improved. Recommend increase in glipizide to 10 mg daily, she will change dose with the meds she has and contact me in 2 to 3 weeks , so new dose can be sent Kelly Savage is reminded of the importance of commitment to daily physical activity for 30 minutes or more, as able and the need to limit carbohydrate  intake to 30 to 60 grams per meal to help with blood sugar control.   The need to take medication as prescribed, test blood sugar as directed, and to call between visits if there is a concern that blood sugar is uncontrolled is also discussed.   Kelly Savage is reminded of the importance of daily foot  exam, annual eye examination, and good blood sugar, blood pressure and cholesterol control.  Diabetic Labs Latest Ref Rng 07/09/2015 01/30/2015 01/29/2015 09/25/2014 04/17/2014  HbA1c <5.7 % 7.3(H) - 7.7(H) 8.0(H) 7.2(H)  Microalbumin <2.0 mg/dL - 0.5 - - -  Micro/Creat Ratio 0.0 - 30.0 mg/g - 5.2 - - -  Chol 125 - 200 mg/dL - - 161148 096184 045180  HDL >=40>=46 mg/dL - - 55 49 52  Calc LDL <130 mg/dL - - 79 981(X108(H) 99  Triglycerides <150 mg/dL - - 71 914134 782147  Creatinine 0.50 - 1.05 mg/dL 9.560.85 - 2.130.71 0.860.72 5.780.70   BP/Weight 07/10/2015 01/30/2015 09/26/2014 04/18/2014 11/15/2013 07/17/2013 02/20/2013  Systolic BP 112 118 118 114 110 120 -  Diastolic BP 78 80 78 64 70 84 -  Wt. (Lbs) 236 234 236 230 235.04 241 243  BMI 34.84 34.54 34.84 33.95 34.69 35.57 35.87   Foot/eye exam completion dates Latest Ref Rng 01/30/2015 09/06/2014  Eye Exam No Retinopathy - No Retinopathy  Foot exam Order - - -  Foot Form Completion - Done -         Back pain, chronic No relief with muscle relaxant robaxin, trial of gabapentin at bedtime Continue hydrocodone as before  Hyperlipidemia with target LDL less than 100 Hyperlipidemia:Low fat diet discussed and encouraged.   Lipid Panel  Lab Results  Component Value Date   CHOL 148 01/29/2015   HDL 55 01/29/2015   LDLCALC 79 01/29/2015   TRIG 71 01/29/2015   CHOLHDL 2.7 01/29/2015   Updated lab needed at/ before next visit.     Obesity Deteriorated. Patient re-educated about  the importance of commitment to a  minimum of 150 minutes of exercise per week.  The importance of healthy food choices with portion control discussed. Encouraged to start a food diary, count calories and to consider  joining a support group. Sample diet sheets offered. Goals set by the patient for the next several months.   Weight /BMI 07/10/2015 01/30/2015 09/26/2014  WEIGHT 236 lb 234 lb 236 lb  HEIGHT - 5\' 9"  5\' 9"   BMI 34.84 kg/m2 34.54 kg/m2 34.84 kg/m2    Current exercise per week 90  mins   Oral ulceration 5 day h/o acute oral ulcer, uncertain if pineapple was the irritant 5 day course of keflex, if unresolved , ENT eval

## 2015-07-11 ENCOUNTER — Other Ambulatory Visit: Payer: Self-pay

## 2015-07-11 ENCOUNTER — Telehealth: Payer: Self-pay | Admitting: Family Medicine

## 2015-07-11 DIAGNOSIS — K121 Other forms of stomatitis: Secondary | ICD-10-CM | POA: Insufficient documentation

## 2015-07-11 MED ORDER — TRIAMCINOLONE ACETONIDE 0.1 % MT PSTE: PASTE | OROMUCOSAL | Status: DC

## 2015-07-11 MED FILL — TRIAMCINOLONE 0.1% PASTE: 0.1 | 10 days supply | Qty: 5 | Fill #0

## 2015-07-11 MED FILL — HYDROCODON-APAP 5-325: 5-325 | 90 days supply | Qty: 90 | Fill #0

## 2015-07-11 NOTE — Assessment & Plan Note (Signed)
No relief with muscle relaxant robaxin, trial of gabapentin at bedtime Continue hydrocodone as before

## 2015-07-11 NOTE — Telephone Encounter (Signed)
Please advise 

## 2015-07-11 NOTE — Assessment & Plan Note (Signed)
5 day h/o acute oral ulcer, uncertain if pineapple was the irritant 5 day course of keflex, if unresolved , ENT eval

## 2015-07-11 NOTE — Assessment & Plan Note (Signed)

## 2015-07-11 NOTE — Telephone Encounter (Signed)
Medication sent and patient aware.  Confirmed with pharmacy that it was available.

## 2015-07-11 NOTE — Assessment & Plan Note (Signed)
Improved. Recommend increase in glipizide to 10 mg daily, she will change dose with the meds she has and contact me in 2 to 3 weeks , so new dose can be sent Kelly Savage is reminded of the importance of commitment to daily physical activity for 30 minutes or more, as able and the need to limit carbohydrate intake to 30 to 60 grams per meal to help with blood sugar control.   The need to take medication as prescribed, test blood sugar as directed, and to call between visits if there is a concern that blood sugar is uncontrolled is also discussed.   Kelly Savage is reminded of the importance of daily foot exam, annual eye examination, and good blood sugar, blood pressure and cholesterol control.  Diabetic Labs Latest Ref Rng 07/09/2015 01/30/2015 01/29/2015 09/25/2014 04/17/2014  HbA1c <5.7 % 7.3(H) - 7.7(H) 8.0(H) 7.2(H)  Microalbumin <2.0 mg/dL - 0.5 - - -  Micro/Creat Ratio 0.0 - 30.0 mg/g - 5.2 - - -  Chol 125 - 200 mg/dL - - 161148 096184 045180  HDL >=40>=46 mg/dL - - 55 49 52  Calc LDL <130 mg/dL - - 79 981(X108(H) 99  Triglycerides <150 mg/dL - - 71 914134 782147  Creatinine 0.50 - 1.05 mg/dL 9.560.85 - 2.130.71 0.860.72 5.780.70   BP/Weight 07/10/2015 01/30/2015 09/26/2014 04/18/2014 11/15/2013 07/17/2013 02/20/2013  Systolic BP 112 118 118 114 110 120 -  Diastolic BP 78 80 78 64 70 84 -  Wt. (Lbs) 236 234 236 230 235.04 241 243  BMI 34.84 34.54 34.84 33.95 34.69 35.57 35.87   Foot/eye exam completion dates Latest Ref Rng 01/30/2015 09/06/2014  Eye Exam No Retinopathy - No Retinopathy  Foot exam Order - - -  Foot Form Completion - Done -

## 2015-07-11 NOTE — Telephone Encounter (Addendum)
Script entered, please call pharmacy and ensure available and send in  And let her know

## 2015-07-11 NOTE — Assessment & Plan Note (Signed)
Hyperlipidemia:Low fat diet discussed and encouraged.   Lipid Panel  Lab Results  Component Value Date   CHOL 148 01/29/2015   HDL 55 01/29/2015   LDLCALC 79 01/29/2015   TRIG 71 01/29/2015   CHOLHDL 2.7 01/29/2015   Updated lab needed at/ before next visit.

## 2015-07-11 NOTE — Assessment & Plan Note (Signed)
Deteriorated. Patient re-educated about  the importance of commitment to a  minimum of 150 minutes of exercise per week.  The importance of healthy food choices with portion control discussed. Encouraged to start a food diary, count calories and to consider  joining a support group. Sample diet sheets offered. Goals set by the patient for the next several months.   Weight /BMI 07/10/2015 01/30/2015 09/26/2014  WEIGHT 236 lb 234 lb 236 lb  HEIGHT - 5\' 9"  5\' 9"   BMI 34.84 kg/m2 34.54 kg/m2 34.84 kg/m2    Current exercise per week 90 mins

## 2015-07-11 NOTE — Telephone Encounter (Signed)
Still has pain in left side of neck, swollen sore on the side of her tongue, she is asking for kenalog oral base, [facility] [address] and she will pick it up

## 2015-07-12 LAB — CYTOLOGY - PAP

## 2015-07-15 ENCOUNTER — Telehealth: Payer: Self-pay | Admitting: Family Medicine

## 2015-07-15 ENCOUNTER — Other Ambulatory Visit: Payer: Self-pay | Admitting: Family Medicine

## 2015-07-15 DIAGNOSIS — K121 Other forms of stomatitis: Secondary | ICD-10-CM

## 2015-07-15 NOTE — Telephone Encounter (Signed)
Urgent referral entered,ho[pefully an appt date can be provided before end of the day, pls let her know( I have spoken directly to Soledad GerlachLeigh Ann also)

## 2015-07-15 NOTE — Telephone Encounter (Signed)
Patient confirmed that she is taking Keflex and tomorrow will be the 5th day.  [place]

## 2015-07-15 NOTE — Telephone Encounter (Signed)
Please advise.  Do you want to send in Keflex?

## 2015-07-15 NOTE — Telephone Encounter (Signed)
Medications called in last week for mouth ulcer and she has been taking it for 5 days now and its still not gone, its sore and still big and neck still swollen some and still tender, please advise?

## 2015-07-16 NOTE — Telephone Encounter (Signed)
Patient scheduled.

## 2015-07-17 NOTE — Addendum Note (Signed)
Addended by: Kandis FantasiaSLADE, COURTNEY B on: 07/17/2015 12:34 PM   Modules accepted: Orders

## 2015-07-22 DIAGNOSIS — K121 Other forms of stomatitis: Secondary | ICD-10-CM | POA: Diagnosis not present

## 2015-07-22 MED FILL — LIDOCAINE 2% VISCOUS SOLN: 2 | 5 days supply | Qty: 100 | Fill #0

## 2015-08-05 ENCOUNTER — Telehealth: Payer: Self-pay | Admitting: Family Medicine

## 2015-08-05 MED ORDER — GLIPIZIDE ER 2.5 MG PO TB24: 2.5000 mg | ORAL_TABLET | Freq: Every day | ORAL | Status: DC

## 2015-08-05 MED ORDER — GLIPIZIDE ER 10 MG PO TB24: 10.0000 mg | ORAL_TABLET | Freq: Every day | ORAL | Status: DC

## 2015-08-05 MED FILL — glipiZIDE XL 10 MG TB24: 10 | 90 days supply | Qty: 90 | Fill #0

## 2015-08-05 NOTE — Telephone Encounter (Signed)
I called pt, sore in mouth improved Sates FBG still 140's and 2 hrs after meal 127, will send in 12.5 mg glipizide daily She is to keep in touch

## 2015-09-02 ENCOUNTER — Other Ambulatory Visit: Payer: Self-pay | Admitting: Family Medicine

## 2015-09-02 MED FILL — PRAVASTATIN NA 40 MG TAB: 40 | 90 days supply | Qty: 90 | Fill #1

## 2015-09-02 MED FILL — JANUMET XR 50-1,000 MG TAB: 50-1000 | 90 days supply | Qty: 180 | Fill #3

## 2015-09-03 MED FILL — INVOKANA 300 MG TABLET: 300 | 90 days supply | Qty: 90 | Fill #0

## 2015-09-11 ENCOUNTER — Other Ambulatory Visit: Payer: Self-pay

## 2015-09-11 DIAGNOSIS — Z1231 Encounter for screening mammogram for malignant neoplasm of breast: Secondary | ICD-10-CM

## 2015-09-17 ENCOUNTER — Ambulatory Visit
Admission: RE | Admit: 2015-09-17 | Discharge: 2015-09-17 | Disposition: A | Discharge status: Home / Self Care | Payer: 59 | Source: Ambulatory Visit

## 2015-09-17 DIAGNOSIS — Z1231 Encounter for screening mammogram for malignant neoplasm of breast: Secondary | ICD-10-CM

## 2015-09-20 MED FILL — glipiZIDE ER 2.5 MG TB24: 2.5 | 90 days supply | Qty: 90 | Fill #0

## 2015-10-04 MED FILL — VIT D2 1.25 MG (50,000 UNIT: 1.25 MG | 84 days supply | Qty: 12 | Fill #1

## 2015-10-04 MED FILL — BENAZEPRIL HCL 10 MG TABLET: 10 | 90 days supply | Qty: 45 | Fill #1

## 2015-10-08 MED FILL — GABAPENTIN 300 MG CAPSULE: 300 | 90 days supply | Qty: 90 | Fill #1

## 2015-11-04 DIAGNOSIS — M25562 Pain in left knee: Secondary | ICD-10-CM | POA: Diagnosis not present

## 2015-11-13 ENCOUNTER — Ambulatory Visit: Payer: 59 | Admitting: Family Medicine

## 2015-12-03 DIAGNOSIS — E785 Hyperlipidemia, unspecified: Secondary | ICD-10-CM | POA: Diagnosis not present

## 2015-12-03 DIAGNOSIS — E669 Obesity, unspecified: Secondary | ICD-10-CM | POA: Diagnosis not present

## 2015-12-03 DIAGNOSIS — E119 Type 2 diabetes mellitus without complications: Secondary | ICD-10-CM | POA: Diagnosis not present

## 2015-12-03 LAB — HEMOGLOBIN A1C
HEMOGLOBIN A1C: 7.1 % — AB (ref <5.7)
MEAN PLASMA GLUCOSE: 157 mg/dL

## 2015-12-04 ENCOUNTER — Ambulatory Visit (INDEPENDENT_AMBULATORY_CARE_PROVIDER_SITE_OTHER): Payer: 59 | Admitting: Family Medicine

## 2015-12-04 ENCOUNTER — Encounter: Payer: Self-pay | Admitting: Family Medicine

## 2015-12-04 VITALS — BP 100/64 | HR 74 | Resp 18 | Ht 69.0 in | Wt 235.0 lb

## 2015-12-04 DIAGNOSIS — M549 Dorsalgia, unspecified: Secondary | ICD-10-CM

## 2015-12-04 DIAGNOSIS — M62830 Muscle spasm of back: Secondary | ICD-10-CM

## 2015-12-04 DIAGNOSIS — E785 Hyperlipidemia, unspecified: Secondary | ICD-10-CM

## 2015-12-04 DIAGNOSIS — E559 Vitamin D deficiency, unspecified: Secondary | ICD-10-CM | POA: Diagnosis not present

## 2015-12-04 DIAGNOSIS — E119 Type 2 diabetes mellitus without complications: Secondary | ICD-10-CM | POA: Diagnosis not present

## 2015-12-04 DIAGNOSIS — E1169 Type 2 diabetes mellitus with other specified complication: Secondary | ICD-10-CM

## 2015-12-04 DIAGNOSIS — G8929 Other chronic pain: Secondary | ICD-10-CM

## 2015-12-04 DIAGNOSIS — M25569 Pain in unspecified knee: Secondary | ICD-10-CM

## 2015-12-04 DIAGNOSIS — E669 Obesity, unspecified: Secondary | ICD-10-CM

## 2015-12-04 LAB — BASIC METABOLIC PANEL WITH GFR
BUN: 15 mg/dL (ref 7–25)
CALCIUM: 9.2 mg/dL (ref 8.6–10.4)
CHLORIDE: 105 mmol/L (ref 98–110)
CO2: 24 mmol/L (ref 20–31)
CREATININE: 0.79 mg/dL (ref 0.50–1.05)
GFR, Est Non African American: 82 mL/min (ref >=60)
Glucose, Bld: 73 mg/dL (ref 65–99)
Potassium: 3.7 mmol/L (ref 3.5–5.3)
SODIUM: 140 mmol/L (ref 135–146)

## 2015-12-04 LAB — LIPID PANEL
CHOL/HDL RATIO: 2.3 ratio (ref <=5.0)
Cholesterol: 143 mg/dL (ref 125–200)
HDL: 61 mg/dL (ref >=46)
LDL CALC: 62 mg/dL (ref <130)
Triglycerides: 101 mg/dL (ref <150)
VLDL: 20 mg/dL (ref <30)

## 2015-12-04 MED ORDER — GLIPIZIDE ER 2.5 MG PO TB24: 2.5000 mg | ORAL_TABLET | Freq: Every day | ORAL | 99 refills | Status: DC

## 2015-12-04 MED ORDER — SITAGLIP PHOS-METFORMIN HCL ER 50-1000 MG PO TB24: 1.0000 | ORAL_TABLET | Freq: Two times a day (BID) | ORAL | 99 refills | Status: DC

## 2015-12-04 MED ORDER — PRAVASTATIN SODIUM 40 MG PO TABS: 40.0000 mg | ORAL_TABLET | Freq: Every evening | ORAL | 99 refills | Status: DC

## 2015-12-04 MED ORDER — GABAPENTIN 300 MG PO CAPS: 300.0000 mg | ORAL_CAPSULE | Freq: Every day | ORAL | 99 refills | Status: DC

## 2015-12-04 MED ORDER — CANAGLIFLOZIN 300 MG PO TABS: 300.0000 mg | ORAL_TABLET | Freq: Every day | ORAL | 99 refills | Status: DC

## 2015-12-04 MED ORDER — ERGOCALCIFEROL 1.25 MG (50000 UT) PO CAPS: 50000.0000 [IU] | ORAL_CAPSULE | ORAL | 99 refills | Status: DC

## 2015-12-04 MED ORDER — BENAZEPRIL HCL 5 MG PO TABS: 5.0000 mg | ORAL_TABLET | Freq: Every day | ORAL | 1 refills | Status: DC

## 2015-12-04 MED ORDER — GLIPIZIDE ER 10 MG PO TB24: 10.0000 mg | ORAL_TABLET | Freq: Every day | ORAL | 99 refills | Status: DC

## 2015-12-04 MED ORDER — HYDROCODONE-ACETAMINOPHEN 5-325 MG PO TABS: ORAL_TABLET | ORAL | 0 refills | Status: DC

## 2015-12-04 MED FILL — HYDROCODON-APAP 5-325: 5-325 | 90 days supply | Qty: 90 | Fill #0

## 2015-12-04 MED FILL — JANUMET XR 50-1,000 MG TAB: 50-1000 | 90 days supply | Qty: 180 | Fill #0

## 2015-12-04 MED FILL — PRAVASTATIN NA 40 MG TAB: 40 | 90 days supply | Qty: 90 | Fill #0

## 2015-12-04 MED FILL — glipiZIDE XL 10 MG TB24: 10 | 90 days supply | Qty: 90 | Fill #0

## 2015-12-04 NOTE — Assessment & Plan Note (Signed)
Deteriorated. Patient re-educated about  the importance of commitment to a  minimum of 150 minutes of exercise per week.  The importance of healthy food choices with portion control discussed. Encouraged to start a food diary, count calories and to consider  joining a support group. Sample diet sheets offered. Goals set by the patient for the next several months.   Weight /BMI 12/04/2015 07/10/2015 01/30/2015  WEIGHT 235 lb 236 lb 234 lb  HEIGHT 5\' 9"  5\' 9"  5\' 9"   BMI 34.7 kg/m2 34.84 kg/m2 34.54 kg/m2   Unchanged, interested in bariatric surgery

## 2015-12-04 NOTE — Patient Instructions (Signed)
F/u in 4 month, call if you need me sooner  Reduce benazepril to 5 mg alt day, if unable top break in half and take half daily  Congrats on improved blood sugar  Non fast labs for next visit  Info for baraitric surgery is provided, you initiate the contact  Pls work on 75% plat being vegetables**  Thank you  for choosing [facility]. We consider it a privelige to serve you.  Delivering excellent health care in a caring and  compassionate way is our goal.  Partnering with you,  so that together we can achieve this goal is our strategy.

## 2015-12-04 NOTE — Assessment & Plan Note (Signed)
Hyperlipidemia:Low fat diet discussed and encouraged.   Lipid Panel  Lab Results  Component Value Date   CHOL 143 12/03/2015   HDL 61 12/03/2015   LDLCALC 62 12/03/2015   TRIG 101 12/03/2015   CHOLHDL 2.3 12/03/2015   Controlled, no change in medication

## 2015-12-04 NOTE — Progress Notes (Signed)
Kelly Savage     MRN: [id number]      DOB: 1955-11-01   HPI Kelly Savage is here for follow up and re-evaluation of chronic medical conditions, medication management and review of any available recent lab and radiology data.  Preventive health is updated, specifically  Cancer screening and Immunization.   Questions or concerns regarding consultations or procedures which the PT has had in the interim are  addressed. The PT denies any adverse reactions to current medications since the last visit.  There are no new concerns.  There are no specific complaints  Denies polyuria, polydipsia, blurred vision , or hypoglycemic episodes.   ROS Denies recent fever or chills. Denies sinus pressure, nasal congestion, ear pain or sore throat. Denies chest congestion, productive cough or wheezing. Denies chest pains, palpitations and leg swelling Denies abdominal pain, nausea, vomiting,diarrhea or constipation.   Denies dysuria, frequency, hesitancy or incontinence. Denies uncontrolled joint pain, swelling and limitation in mobility, recently had injection in knee prior to herr beach trip Denies headaches, seizures, numbness, or tingling. Denies depression, anxiety or insomnia. Denies skin break down or rash.   PE  BP 100/64   Pulse 74   Resp 18   Ht  (1.753 m)   Wt 235 lb (106.6 kg)   SpO2 98%   BMI 34.70 kg/m   Patient alert and oriented and in no cardiopulmonary distress.  HEENT: No facial asymmetry, EOMI,   oropharynx pink and moist.  Neck supple no JVD, no mass.  Chest: Clear to auscultation bilaterally.  CVS: S1, S2 no murmurs, no S3.Regular rate.  ABD: Soft non tender.   Ext: No edema  MS: Adequate ROM spine, shoulders, hips and reduced in  knees.  Skin: Intact, no ulcerations or rash noted.  Psych: Good eye contact, normal affect. Memory intact not anxious or depressed appearing.  CNS: CN 2-12 intact, power,  normal throughout.no focal deficits  noted.   Assessment & Plan  Diabetes mellitus type 2 in obese Improved [name] is reminded of the importance of commitment to daily physical activity for 30 minutes or more, as able and the need to limit carbohydrate intake to 30 to 60 grams per meal to help with blood sugar control.   The need to take medication as prescribed, test blood sugar as directed, and to call between visits if there is a concern that blood sugar is uncontrolled is also discussed.   [name] is reminded of the importance of daily foot exam, annual eye examination, and good blood sugar, blood pressure and cholesterol control.  Diabetic Labs Latest Ref Rng & Units 12/03/2015 07/09/2015 01/30/2015 01/29/2015 09/25/2014  HbA1c <5.7 % 7.1(H) 7.3(H) - 7.7(H) 8.0(H)  Microalbumin <2.0 mg/dL - - 0.5 - -  Micro/Creat Ratio 0.0 - 30.0 mg/g - - 5.2 - -  Chol 125 - 200 mg/dL 409 - - 811 914  HDL >=78 mg/dL 61 - - 55 49  Calc LDL <130 mg/dL 62 - - 79 295(A)  Triglycerides <150 mg/dL 213 - - 71 086  Creatinine 0.50 - 1.05 mg/dL 5.78 4.69 - 6.29 5.28   BP/Weight 12/04/2015 07/10/2015 01/30/2015 09/26/2014 04/18/2014 11/15/2013 07/17/2013  Systolic BP 100 112 118 118 114 110 120  Diastolic BP 64 78 80 78 64 70 84  Wt. (Lbs) 235 236 234 236 230 235.04 241  BMI 34.7 34.84 34.54 34.84 33.95 34.69 35.57   Foot/eye exam completion dates Latest Ref Rng & Units 01/30/2015 09/06/2014  Eye  Exam No Retinopathy - No Retinopathy  Foot exam Order - - -  Foot Form Completion - Done -        Knee pain Unchanged, no change in management, script given, per routine  Obesity Deteriorated. Patient re-educated about  the importance of commitment to a  minimum of 150 minutes of exercise per week.  The importance of healthy food choices with portion control discussed. Encouraged to start a food diary, count calories and to consider  joining a support group. Sample diet sheets offered. Goals set by the patient for the next several months.   Weight  /BMI 12/04/2015 07/10/2015 01/30/2015  WEIGHT 235 lb 236 lb 234 lb  HEIGHT 5\' 9"  5\' 9"  5\' 9"   BMI 34.7 kg/m2 34.84 kg/m2 34.54 kg/m2   Unchanged, interested in bariatric surgery   Vitamin D deficiency Updated lab needed at/ before next visit. Continue weekly vit D  Hyperlipidemia with target LDL less than 100 Hyperlipidemia:Low fat diet discussed and encouraged.   Lipid Panel  Lab Results  Component Value Date   CHOL 143 12/03/2015   HDL 61 12/03/2015   LDLCALC 62 12/03/2015   TRIG 101 12/03/2015   CHOLHDL 2.3 12/03/2015   Controlled, no change in medication

## 2015-12-04 NOTE — Assessment & Plan Note (Signed)
Improved Kelly Savage is reminded of the importance of commitment to daily physical activity for 30 minutes or more, as able and the need to limit carbohydrate intake to 30 to 60 grams per meal to help with blood sugar control.   The need to take medication as prescribed, test blood sugar as directed, and to call between visits if there is a concern that blood sugar is uncontrolled is also discussed.   Kelly Savage is reminded of the importance of daily foot exam, annual eye examination, and good blood sugar, blood pressure and cholesterol control.  Diabetic Labs Latest Ref Rng & Units 12/03/2015 07/09/2015 01/30/2015 01/29/2015 09/25/2014  HbA1c <5.7 % 7.1(H) 7.3(H) - 7.7(H) 8.0(H)  Microalbumin <2.0 mg/dL - - 0.5 - -  Micro/Creat Ratio 0.0 - 30.0 mg/g - - 5.2 - -  Chol 125 - 200 mg/dL 758 - - 832 549  HDL >=82 mg/dL 61 - - 55 49  Calc LDL <130 mg/dL 62 - - 79 641(R)  Triglycerides <150 mg/dL 830 - - 71 940  Creatinine 0.50 - 1.05 mg/dL 7.68 0.88 - 1.10 3.15   BP/Weight 12/04/2015 07/10/2015 01/30/2015 09/26/2014 04/18/2014 11/15/2013 07/17/2013  Systolic BP 100 112 118 118 114 110 120  Diastolic BP 64 78 80 78 64 70 84  Wt. (Lbs) 235 236 234 236 230 235.04 241  BMI 34.7 34.84 34.54 34.84 33.95 34.69 35.57   Foot/eye exam completion dates Latest Ref Rng & Units 01/30/2015 09/06/2014  Eye Exam No Retinopathy - No Retinopathy  Foot exam Order - - -  Foot Form Completion - Done -

## 2015-12-04 NOTE — Assessment & Plan Note (Signed)
Unchanged, no change in management, script given, per routine

## 2015-12-04 NOTE — Assessment & Plan Note (Signed)
Updated lab needed at/ before next visit. Continue weekly vit D 

## 2015-12-20 MED FILL — INVOKANA 300 MG TABLET: 300 | 90 days supply | Qty: 90 | Fill #1

## 2015-12-26 DIAGNOSIS — M170 Bilateral primary osteoarthritis of knee: Secondary | ICD-10-CM | POA: Diagnosis not present

## 2016-01-02 DIAGNOSIS — M170 Bilateral primary osteoarthritis of knee: Secondary | ICD-10-CM | POA: Diagnosis not present

## 2016-01-07 MED FILL — GABAPENTIN 300 MG CAPSULE: 300 | 90 days supply | Qty: 90 | Fill #0

## 2016-01-07 MED FILL — VIT D2 1.25 MG (50,000 UNIT: 1.25 MG | 84 days supply | Qty: 12 | Fill #0

## 2016-01-09 DIAGNOSIS — M170 Bilateral primary osteoarthritis of knee: Secondary | ICD-10-CM | POA: Diagnosis not present

## 2016-02-03 DIAGNOSIS — E119 Type 2 diabetes mellitus without complications: Secondary | ICD-10-CM | POA: Diagnosis not present

## 2016-02-03 DIAGNOSIS — H2513 Age-related nuclear cataract, bilateral: Secondary | ICD-10-CM | POA: Diagnosis not present

## 2016-02-03 LAB — HM DIABETES EYE EXAM

## 2016-03-02 MED FILL — INVOKANA 300 MG TABLET: 300 | 90 days supply | Qty: 90 | Fill #2

## 2016-03-02 MED FILL — BENAZEPRIL HCL 10 MG TABLET: 10 | 90 days supply | Qty: 45 | Fill #2

## 2016-03-02 MED FILL — PRAVASTATIN NA 40 MG TAB: 40 | 90 days supply | Qty: 90 | Fill #1

## 2016-03-02 MED FILL — glipiZIDE ER 2.5 MG TB24: 2.5 | 90 days supply | Qty: 90 | Fill #1

## 2016-03-02 MED FILL — glipiZIDE XL 10 MG TB24: 10 | 90 days supply | Qty: 90 | Fill #1

## 2016-03-02 MED FILL — JANUMET XR 50-1,000 MG TAB: 50-1000 | 90 days supply | Qty: 180 | Fill #1

## 2016-04-06 ENCOUNTER — Ambulatory Visit: Payer: 59 | Admitting: Family Medicine

## 2016-04-24 DIAGNOSIS — E119 Type 2 diabetes mellitus without complications: Secondary | ICD-10-CM | POA: Diagnosis not present

## 2016-04-24 DIAGNOSIS — E669 Obesity, unspecified: Secondary | ICD-10-CM | POA: Diagnosis not present

## 2016-04-24 DIAGNOSIS — E559 Vitamin D deficiency, unspecified: Secondary | ICD-10-CM | POA: Diagnosis not present

## 2016-04-24 LAB — CBC
HCT: 40.1 % (ref 35.0–45.0)
HEMOGLOBIN: 13.4 g/dL (ref 11.7–15.5)
MCH: 29.3 pg (ref 27.0–33.0)
MCHC: 33.4 g/dL (ref 32.0–36.0)
MCV: 87.7 fL (ref 80.0–100.0)
MPV: 11.5 fL (ref 7.5–12.5)
Platelets: 211 10*3/uL (ref 140–400)
RBC: 4.57 MIL/uL (ref 3.80–5.10)
RDW: 14.8 % (ref 11.0–15.0)
WBC: 6 10*3/uL (ref 3.8–10.8)

## 2016-04-24 LAB — HEMOGLOBIN A1C
HEMOGLOBIN A1C: 6.3 % — AB (ref <5.7)
Mean Plasma Glucose: 134 mg/dL

## 2016-04-25 LAB — COMPLETE METABOLIC PANEL WITH GFR
ALT: 16 U/L (ref 6–29)
AST: 18 U/L (ref 10–35)
Albumin: 3.9 g/dL (ref 3.6–5.1)
Alkaline Phosphatase: 49 U/L (ref 33–130)
BUN: 21 mg/dL (ref 7–25)
CALCIUM: 9.3 mg/dL (ref 8.6–10.4)
CHLORIDE: 104 mmol/L (ref 98–110)
CO2: 25 mmol/L (ref 20–31)
CREATININE: 0.77 mg/dL (ref 0.50–0.99)
GFR, Est Non African American: 84 mL/min (ref >=60)
Glucose, Bld: 67 mg/dL (ref 65–99)
POTASSIUM: 4.1 mmol/L (ref 3.5–5.3)
Sodium: 141 mmol/L (ref 135–146)
Total Bilirubin: 1.2 mg/dL (ref 0.2–1.2)
Total Protein: 6.6 g/dL (ref 6.1–8.1)

## 2016-04-25 LAB — VITAMIN D 25 HYDROXY (VIT D DEFICIENCY, FRACTURES): VIT D 25 HYDROXY: 53 ng/mL (ref 30–100)

## 2016-04-29 ENCOUNTER — Other Ambulatory Visit (HOSPITAL_COMMUNITY)
Admission: RE | Admit: 2016-04-29 | Discharge: 2016-04-29 | Disposition: A | Discharge status: Home / Self Care | Payer: 59 | Source: Ambulatory Visit | Attending: Family Medicine | Admitting: Family Medicine

## 2016-04-29 ENCOUNTER — Ambulatory Visit (INDEPENDENT_AMBULATORY_CARE_PROVIDER_SITE_OTHER): Payer: 59 | Admitting: Family Medicine

## 2016-04-29 ENCOUNTER — Encounter: Payer: Self-pay | Admitting: Family Medicine

## 2016-04-29 VITALS — BP 122/82 | HR 87 | Resp 16 | Ht 69.0 in | Wt 237.0 lb

## 2016-04-29 DIAGNOSIS — M25561 Pain in right knee: Secondary | ICD-10-CM

## 2016-04-29 DIAGNOSIS — E559 Vitamin D deficiency, unspecified: Secondary | ICD-10-CM

## 2016-04-29 DIAGNOSIS — E669 Obesity, unspecified: Secondary | ICD-10-CM

## 2016-04-29 DIAGNOSIS — E1169 Type 2 diabetes mellitus with other specified complication: Secondary | ICD-10-CM

## 2016-04-29 DIAGNOSIS — Z1329 Encounter for screening for other suspected endocrine disorder: Secondary | ICD-10-CM | POA: Diagnosis not present

## 2016-04-29 DIAGNOSIS — IMO0001 Reserved for inherently not codable concepts without codable children: Secondary | ICD-10-CM

## 2016-04-29 DIAGNOSIS — M25562 Pain in left knee: Secondary | ICD-10-CM

## 2016-04-29 DIAGNOSIS — Z23 Encounter for immunization: Secondary | ICD-10-CM | POA: Diagnosis not present

## 2016-04-29 DIAGNOSIS — G8929 Other chronic pain: Secondary | ICD-10-CM

## 2016-04-29 DIAGNOSIS — E785 Hyperlipidemia, unspecified: Secondary | ICD-10-CM

## 2016-04-29 DIAGNOSIS — E6609 Other obesity due to excess calories: Secondary | ICD-10-CM

## 2016-04-29 MED ORDER — GABAPENTIN 300 MG PO CAPS
300.0000 mg | ORAL_CAPSULE | Freq: Every day | ORAL | 3 refills | Status: DC
Start: 2016-04-29 — End: 2016-12-07

## 2016-04-29 MED ORDER — IBUPROFEN 800 MG PO TABS: ORAL_TABLET | ORAL | 0 refills | Status: DC

## 2016-04-29 MED ORDER — HYDROCODONE-ACETAMINOPHEN 5-325MG PREPACK (~~LOC~~: ORAL_TABLET | ORAL | 0 refills | Status: DC

## 2016-04-29 MED FILL — GABAPENTIN 300 MG CAPSULE: 300 | 90 days supply | Qty: 90 | Fill #0

## 2016-04-29 MED FILL — HYDROCODON-APAP 5-325: 5-325 | 90 days supply | Qty: 90 | Fill #0

## 2016-04-29 NOTE — Assessment & Plan Note (Signed)
Deteriorated. Patient re-educated about  the importance of commitment to a  minimum of 150 minutes of exercise per week.  The importance of healthy food choices with portion control discussed. Encouraged to start a food diary, count calories and to consider  joining a support group. Sample diet sheets offered. Goals set by the patient for the next several months.   Weight /BMI 04/29/2016 12/04/2015 07/10/2015  WEIGHT 237 lb 235 lb 236 lb  HEIGHT 5\' 9"  5\' 9"  5\' 9"   BMI 35 kg/m2 34.7 kg/m2 34.84 kg/m2

## 2016-04-29 NOTE — Assessment & Plan Note (Signed)
Hyperlipidemia:Low fat diet discussed and encouraged.   Lipid Panel  Lab Results  Component Value Date   CHOL 143 12/03/2015   HDL 61 12/03/2015   LDLCALC 62 12/03/2015   TRIG 101 12/03/2015   CHOLHDL 2.3 12/03/2015

## 2016-04-29 NOTE — Assessment & Plan Note (Signed)
[name] is reminded of the importance of commitment to daily physical activity for 30 minutes or more, as able and the need to limit carbohydrate intake to 30 to 60 grams per meal to help with blood sugar control.   The need to take medication as prescribed, test blood sugar as directed, and to call between visits if there is a concern that blood sugar is uncontrolled is also discussed.   [name] is reminded of the importance of daily foot exam, annual eye examination, and good blood sugar, blood pressure and cholesterol control.  Diabetic Labs Latest Ref Rng & Units 04/24/2016 12/03/2015 07/09/2015 01/30/2015 01/29/2015  HbA1c <5.7 % 6.3(H) 7.1(H) 7.3(H) - 7.7(H)  Microalbumin <2.0 mg/dL - - - 0.5 -  Micro/Creat Ratio 0.0 - 30.0 mg/g - - - 5.2 -  Chol 125 - 200 mg/dL - 147143 - - 829148  HDL >=56>=46 mg/dL - 61 - - 55  Calc LDL <213<130 mg/dL - 62 - - 79  Triglycerides <150 mg/dL - 086101 - - 71  Creatinine 0.50 - 0.99 mg/dL 5.780.77 4.690.79 6.290.85 - 5.280.71   BP/Weight 04/29/2016 12/04/2015 07/10/2015 01/30/2015 09/26/2014 04/18/2014 11/15/2013  Systolic BP 122 100 112 118 118 114 110  Diastolic BP 82 64 78 80 78 64 70  Wt. (Lbs) 237 235 236 234 236 230 235.04  BMI 35 34.7 34.84 34.54 34.84 33.95 34.69   Foot/eye exam completion dates Latest Ref Rng & Units 04/29/2016 02/03/2016  Eye Exam No Retinopathy - No Retinopathy  Foot exam Order - - -  Foot Form Completion - Done -

## 2016-04-29 NOTE — Assessment & Plan Note (Signed)
Updated lab needed at/ before next visit.   

## 2016-04-29 NOTE — Patient Instructions (Addendum)
Annual physical exam in 4 month, call if you need me sooner.  Pneumonia 23 today  Pls change eating to plant based, book very informative  Excellent labs.  Change in chronic pain management as discussed  Fasting lipid, cmp and eGFR, hBA1C and tSH inn 4  Months  Please work on good  health habits so that your health will improve. 1. Commitment to daily physical activity for 30 to 60  minutes, if you are able to do this.  2. Commitment to wise food choices. Aim for half of your  food intake to be vegetable and fruit, one quarter starchy foods, and one quarter protein. Try to eat on a regular schedule  3 meals per day, snacking between meals should be limited to vegetables or fruits or small portions of nuts. 64 ounces of water per day is generally recommended, unless you have specific health conditions, like heart failure or kidney failure where you will need to limit fluid intake.  3. Commitment to sufficient and a  good quality of physical and mental rest daily, generally between 6 to 8 hours per day.  WITH PERSISTANCE AND PERSEVERANCE, THE IMPOSSIBLE , BECOMES THE NORM!   Thanks for choosing [facility], we consider it a privelige to serve you.

## 2016-04-29 NOTE — Assessment & Plan Note (Signed)
Pain management discussed she will use hydrocodone 90 tablets every 4 months, ibuprofen one daily as needed, and add tylenol at bedtime

## 2016-04-29 NOTE — Progress Notes (Signed)
Berna SpareCheryl D Corredor     MRN: [id number]      DOB: 12/12/1955   HPI Kelly Savage is here for follow up and re-evaluation of chronic medical conditions, medication management and review of any available recent lab and radiology data.  Preventive health is updated, specifically  Cancer screening and Immunization.   Questions or concerns regarding consultations or procedures which the PT has had in the interim are  addressed. The PT denies any adverse reactions to current medications since the last visit.  There are no new concerns. Denies polyuria, polydipsia, blurred vision , or hypoglycemic episodes.  ROS Denies recent fever or chills. Denies sinus pressure, nasal congestion, ear pain or sore throat. Denies chest congestion, productive cough or wheezing. Denies chest pains, palpitations and leg swelling Denies abdominal pain, nausea, vomiting,diarrhea or constipation.   Denies dysuria, frequency, hesitancy or incontinence. C/o bilateral knee pain has been getting intra articular injections Denies headaches, seizures, numbness, or tingling. Denies depression, anxiety or insomnia. Denies skin break down or rash.   PE  BP 122/82   Pulse 87   Resp 16   Ht 5\' 9"  (1.753 m)   Wt 237 lb (107.5 kg)   SpO2 97%   BMI 35.00 kg/m   Patient alert and oriented and in no cardiopulmonary distress.  HEENT: No facial asymmetry, EOMI,   oropharynx pink and moist.  Neck supple no JVD, no mass.  Chest: Clear to auscultation bilaterally.  CVS: S1, S2 no murmurs, no S3.Regular rate.  ABD: Soft non tender.   Ext: No edema  MS: Adequate ROM spine, shoulders, hips and reduced in  knee.  Skin: Intact, no ulcerations or rash noted.  Psych: Good eye contact, normal affect. Memory intact not anxious or depressed appearing.  CNS: CN 2-12 intact, power,  normal throughout.no focal deficits noted.   Assessment & Plan  Diabetes mellitus type 2 in obese Kelly Savage is reminded of the importance of  commitment to daily physical activity for 30 minutes or more, as able and the need to limit carbohydrate intake to 30 to 60 grams per meal to help with blood sugar control.   The need to take medication as prescribed, test blood sugar as directed, and to call between visits if there is a concern that blood sugar is uncontrolled is also discussed.   Kelly Savage is reminded of the importance of daily foot exam, annual eye examination, and good blood sugar, blood pressure and cholesterol control.  Diabetic Labs Latest Ref Rng & Units 04/24/2016 12/03/2015 07/09/2015 01/30/2015 01/29/2015  HbA1c <5.7 % 6.3(H) 7.1(H) 7.3(H) - 7.7(H)  Microalbumin <2.0 mg/dL - - - 0.5 -  Micro/Creat Ratio 0.0 - 30.0 mg/g - - - 5.2 -  Chol 125 - 200 mg/dL - 657143 - - 846148  HDL >=96>=46 mg/dL - 61 - - 55  Calc LDL <295<130 mg/dL - 62 - - 79  Triglycerides <150 mg/dL - 284101 - - 71  Creatinine 0.50 - 0.99 mg/dL 1.320.77 4.400.79 1.020.85 - 7.250.71   BP/Weight 04/29/2016 12/04/2015 07/10/2015 01/30/2015 09/26/2014 04/18/2014 11/15/2013  Systolic BP 122 100 112 118 118 114 110  Diastolic BP 82 64 78 80 78 64 70  Wt. (Lbs) 237 235 236 234 236 230 235.04  BMI 35 34.7 34.84 34.54 34.84 33.95 34.69   Foot/eye exam completion dates Latest Ref Rng & Units 04/29/2016 02/03/2016  Eye Exam No Retinopathy - No Retinopathy  Foot exam Order - - -  Foot Form Completion -  Done -        Knee pain Pain management discussed she will use hydrocodone 90 tablets every 4 months, ibuprofen one daily as needed, and add tylenol at bedtime  Obesity Deteriorated. Patient re-educated about  the importance of commitment to a  minimum of 150 minutes of exercise per week.  The importance of healthy food choices with portion control discussed. Encouraged to start a food diary, count calories and to consider  joining a support group. Sample diet sheets offered. Goals set by the patient for the next several months.   Weight /BMI 04/29/2016 12/04/2015 07/10/2015  WEIGHT 237 lb 235  lb 236 lb  HEIGHT 5\' 9"  5\' 9"  5\' 9"   BMI 35 kg/m2 34.7 kg/m2 34.84 kg/m2      Hyperlipidemia with target LDL less than 100 Hyperlipidemia:Low fat diet discussed and encouraged.   Lipid Panel  Lab Results  Component Value Date   CHOL 143 12/03/2015   HDL 61 12/03/2015   LDLCALC 62 12/03/2015   TRIG 101 12/03/2015   CHOLHDL 2.3 12/03/2015       Vitamin D deficiency Updated lab needed at/ before next visit.   Need for 23-polyvalent pneumococcal polysaccharide vaccine After obtaining informed consent, the vaccine is  administered by LPN.

## 2016-04-29 NOTE — Assessment & Plan Note (Signed)
After obtaining informed consent, the vaccine is  administered by LPN.  

## 2016-04-29 NOTE — Addendum Note (Signed)
Addended by: Abner GreenspanHUDY, Angeles Paolucci H on: 04/29/2016 12:52 PM   Modules accepted: Orders

## 2016-04-30 LAB — MICROALBUMIN / CREATININE URINE RATIO
CREATININE, UR: 57.9 mg/dL
MICROALB/CREAT RATIO: 7.6 mg/g{creat} (ref 0.0–30.0)
Microalb, Ur: 4.4 ug/mL — ABNORMAL HIGH

## 2016-06-02 MED FILL — VIT D2 1.25 MG (50,000 UNIT: 1.25 MG | 84 days supply | Qty: 12 | Fill #1

## 2016-06-02 MED FILL — JANUMET XR 50-1,000 MG TAB: 50-1000 | 30 days supply | Qty: 60 | Fill #2

## 2016-06-02 MED FILL — glipiZIDE ER 2.5 MG TB24: 2.5 | 90 days supply | Qty: 90 | Fill #2

## 2016-06-02 MED FILL — glipiZIDE XL 10 MG TB24: 10 | 90 days supply | Qty: 90 | Fill #2

## 2016-06-02 MED FILL — PRAVASTATIN NA 40 MG TAB: 40 | 90 days supply | Qty: 90 | Fill #2

## 2016-06-02 MED FILL — INVOKANA 300 MG TABLET: 300 | 90 days supply | Qty: 90 | Fill #3

## 2016-07-02 MED FILL — JANUMET XR 50-1,000 MG TAB: 50-1000 | 30 days supply | Qty: 60 | Fill #3

## 2016-07-28 MED FILL — GABAPENTIN 300 MG CAPSULE: 300 | 90 days supply | Qty: 90 | Fill #1

## 2016-07-29 MED FILL — JANUMET XR 50-1,000 MG TAB: 50-1000 | 30 days supply | Qty: 60 | Fill #4

## 2016-08-20 DIAGNOSIS — M170 Bilateral primary osteoarthritis of knee: Secondary | ICD-10-CM | POA: Diagnosis not present

## 2016-08-27 ENCOUNTER — Ambulatory Visit: Payer: 59 | Admitting: Family Medicine

## 2016-08-27 DIAGNOSIS — M170 Bilateral primary osteoarthritis of knee: Secondary | ICD-10-CM | POA: Diagnosis not present

## 2016-08-31 MED FILL — PRAVASTATIN NA 40 MG TAB: 40 | 90 days supply | Qty: 90 | Fill #3

## 2016-08-31 MED FILL — INVOKANA 300 MG TABLET: 300 | 90 days supply | Qty: 90 | Fill #0

## 2016-08-31 MED FILL — glipiZIDE ER 2.5 MG TB24: 2.5 | 90 days supply | Qty: 90 | Fill #0

## 2016-08-31 MED FILL — glipiZIDE XL 10 MG TB24: 10 | 90 days supply | Qty: 90 | Fill #3

## 2016-08-31 MED FILL — JANUMET XR 50-1,000 MG TAB: 50-1000 | 30 days supply | Qty: 60 | Fill #5

## 2016-09-02 DIAGNOSIS — M170 Bilateral primary osteoarthritis of knee: Secondary | ICD-10-CM | POA: Diagnosis not present

## 2016-09-02 MED FILL — VIT D2 1.25 MG (50,000 UNIT: 1.25 MG | 84 days supply | Qty: 12 | Fill #2

## 2016-09-09 DIAGNOSIS — Z1329 Encounter for screening for other suspected endocrine disorder: Secondary | ICD-10-CM | POA: Diagnosis not present

## 2016-09-09 DIAGNOSIS — E785 Hyperlipidemia, unspecified: Secondary | ICD-10-CM | POA: Diagnosis not present

## 2016-09-09 DIAGNOSIS — E1169 Type 2 diabetes mellitus with other specified complication: Secondary | ICD-10-CM | POA: Diagnosis not present

## 2016-09-09 DIAGNOSIS — E669 Obesity, unspecified: Secondary | ICD-10-CM | POA: Diagnosis not present

## 2016-09-09 LAB — LIPID PANEL
Cholesterol: 135 mg/dL (ref <200)
HDL: 47 mg/dL — ABNORMAL LOW (ref >50)
LDL CALC: 64 mg/dL (ref <100)
Total CHOL/HDL Ratio: 2.9 Ratio (ref <5.0)
Triglycerides: 120 mg/dL (ref <150)
VLDL: 24 mg/dL (ref <30)

## 2016-09-09 LAB — COMPLETE METABOLIC PANEL WITH GFR
ALT: 17 U/L (ref 6–29)
AST: 17 U/L (ref 10–35)
Albumin: 4 g/dL (ref 3.6–5.1)
Alkaline Phosphatase: 53 U/L (ref 33–130)
BUN: 16 mg/dL (ref 7–25)
CHLORIDE: 105 mmol/L (ref 98–110)
CO2: 25 mmol/L (ref 20–31)
Calcium: 9.4 mg/dL (ref 8.6–10.4)
Creat: 0.87 mg/dL (ref 0.50–0.99)
GFR, EST NON AFRICAN AMERICAN: 73 mL/min (ref >=60)
GFR, Est African American: 84 mL/min (ref >=60)
GLUCOSE: 144 mg/dL — AB (ref 65–99)
POTASSIUM: 4.6 mmol/L (ref 3.5–5.3)
SODIUM: 142 mmol/L (ref 135–146)
Total Bilirubin: 1.1 mg/dL (ref 0.2–1.2)
Total Protein: 6.5 g/dL (ref 6.1–8.1)

## 2016-09-09 LAB — TSH: TSH: 1.97 mIU/L

## 2016-09-10 ENCOUNTER — Ambulatory Visit (INDEPENDENT_AMBULATORY_CARE_PROVIDER_SITE_OTHER): Payer: 59 | Admitting: Family Medicine

## 2016-09-10 ENCOUNTER — Encounter: Payer: Self-pay | Admitting: Family Medicine

## 2016-09-10 VITALS — BP 120/76 | HR 84 | Resp 16 | Ht 69.0 in | Wt 237.0 lb

## 2016-09-10 DIAGNOSIS — G8929 Other chronic pain: Secondary | ICD-10-CM

## 2016-09-10 DIAGNOSIS — E785 Hyperlipidemia, unspecified: Secondary | ICD-10-CM

## 2016-09-10 DIAGNOSIS — M25562 Pain in left knee: Secondary | ICD-10-CM | POA: Diagnosis not present

## 2016-09-10 DIAGNOSIS — E669 Obesity, unspecified: Secondary | ICD-10-CM

## 2016-09-10 DIAGNOSIS — E1169 Type 2 diabetes mellitus with other specified complication: Secondary | ICD-10-CM | POA: Diagnosis not present

## 2016-09-10 DIAGNOSIS — R52 Pain, unspecified: Secondary | ICD-10-CM

## 2016-09-10 DIAGNOSIS — M25561 Pain in right knee: Secondary | ICD-10-CM | POA: Diagnosis not present

## 2016-09-10 DIAGNOSIS — E559 Vitamin D deficiency, unspecified: Secondary | ICD-10-CM | POA: Diagnosis not present

## 2016-09-10 LAB — HEMOGLOBIN A1C
HEMOGLOBIN A1C: 6.8 % — AB (ref <5.7)
MEAN PLASMA GLUCOSE: 148 mg/dL

## 2016-09-10 MED ORDER — IBUPROFEN 800 MG PO TABS
800.0000 mg | ORAL_TABLET | Freq: Three times a day (TID) | ORAL | 1 refills | Status: DC | PRN
Start: 2016-09-10 — End: 2017-09-29

## 2016-09-10 MED ORDER — HYDROCODONE-ACETAMINOPHEN 5-325MG PREPACK (~~LOC~~: ORAL_TABLET | ORAL | 0 refills | Status: DC

## 2016-09-10 MED FILL — IBUPROFEN 800 MG TAB: 800 | 20 days supply | Qty: 60 | Fill #0

## 2016-09-10 MED FILL — HYDROCODON-APAP 5-325: 5-325 | 90 days supply | Qty: 90 | Fill #0

## 2016-09-10 NOTE — Patient Instructions (Addendum)
Physical exam  Early  October , call if you need me before  No med changes , except if morning sugar is "low" do not take the 22.5 mg glipizide, as you commit to cycling with some weight also, you may be able to stop the 2.5 mg dose Keep up the great work  Vit D, HBa1C, chem 7 and EGFR non fasting 5 days prior please  Please work on good  health habits so that your health will improve. 1. Commitment to daily physical activity for 30 to 60  minutes, if you are able to do this.  2. Commitment to wise food choices. Aim for half of your  food intake to be vegetable and fruit, one quarter starchy foods, and one quarter protein. Try to eat on a regular schedule  3 meals per day, snacking between meals should be limited to vegetables or fruits or small portions of nuts. 64 ounces of water per day is generally recommended, unless you have specific health conditions, like heart failure or kidney failure where you will need to limit fluid intake.  3. Commitment to sufficient and a  good quality of physical and mental rest daily, generally between 6 to 8 hours per day.  WITH PERSISTANCE AND PERSEVERANCE, THE IMPOSSIBLE , BECOMES THE NORM!  It is important that you exercise regularly at least 30 minutes 5 times a week. If you develop chest pain, have severe difficulty breathing, or feel very tired, stop exercising immediately and seek medical attention   Thank you  for choosing [facility]. We consider it a privelige to serve you.  Delivering excellent health care in a caring and  compassionate way is our goal.  Partnering with you,  so that together we can achieve this goal is our strategy.

## 2016-09-11 DIAGNOSIS — R52 Pain, unspecified: Secondary | ICD-10-CM | POA: Insufficient documentation

## 2016-09-11 NOTE — Assessment & Plan Note (Signed)
Controlled, no change in medication Kelly Savage is reminded of the importance of commitment to daily physical activity for 30 minutes or more, as able and the need to limit carbohydrate intake to 30 to 60 grams per meal to help with blood sugar control.   The need to take medication as prescribed, test blood sugar as directed, and to call between visits if there is a concern that blood sugar is uncontrolled is also discussed.   Kelly Savage is reminded of the importance of daily foot exam, annual eye examination, and good blood sugar, blood pressure and cholesterol control.  Diabetic Labs Latest Ref Rng & Units 09/09/2016 04/29/2016 04/24/2016 12/03/2015 07/09/2015  HbA1c <5.7 % 6.8(H) - 6.3(H) 7.1(H) 7.3(H)  Microalbumin Not Estab. ug/mL - 4.4(H) - - -  Micro/Creat Ratio 0.0 - 30.0 mg/g creat - 7.6 - - -  Chol <200 mg/dL 433135 - - 295143 -  HDL >18>50 mg/dL 84(Z47(L) - - 61 -  Calc LDL <100 mg/dL 64 - - 62 -  Triglycerides <150 mg/dL 660120 - - 630101 -  Creatinine 0.50 - 0.99 mg/dL 1.600.87 - 1.090.77 3.230.79 5.570.85   BP/Weight 09/10/2016 04/29/2016 12/04/2015 07/10/2015 01/30/2015 09/26/2014 04/18/2014  Systolic BP 120 122 100 112 118 118 114  Diastolic BP 76 82 64 78 80 78 64  Wt. (Lbs) 237 237 235 236 234 236 230  BMI 35 35 34.7 34.84 34.54 34.84 33.95   Foot/eye exam completion dates Latest Ref Rng & Units 04/29/2016 02/03/2016  Eye Exam No Retinopathy - No Retinopathy  Foot exam Order - - -  Foot Form Completion - Done -

## 2016-09-11 NOTE — Assessment & Plan Note (Signed)
Adequately corrected, change to OTC vit D3 on completion of current script

## 2016-09-11 NOTE — Progress Notes (Signed)
Kelly Savage     MRN: [id number]      DOB: 11/16/1955   HPI Kelly Savage is here for follow up and re-evaluation of chronic medical conditions, medication management and review of any available recent lab and radiology data.  Preventive health is updated, specifically  Cancer screening and Immunization.   Pain management is reviewed and is adequate , [place] registry is reviewed and will continue as before The PT states that n a few occasions she has a blood sugar under 120, around 114, so she does not take the glipizide 10 mg tablet, I advise when holding off , she needs to hold off of the 2.5 mg dose. She denies hypoglycemic episodes. Not commit ed to daily exercise yet but is working toward thisThere are no new concerns.  There are no specific complaints   ROS Denies recent fever or chills. Denies sinus pressure, nasal congestion, ear pain or sore throat. Denies chest congestion, productive cough or wheezing. Denies chest pains, palpitations and leg swelling Denies abdominal pain, nausea, vomiting,diarrhea or constipation.   Denies dysuria, frequency, hesitancy or incontinence. Denies uncontrolled  joint pain, swelling and limitation in mobility. Denies headaches, seizures, numbness, or tingling. Denies depression, anxiety or insomnia. Denies skin break down or rash.   PE  BP 120/76   Pulse 84   Resp 16   Ht 5\' 9"  (1.753 m)   Wt 237 lb (107.5 kg)   SpO2 97%   BMI 35.00 kg/m   Patient alert and oriented and in no cardiopulmonary distress.  HEENT: No facial asymmetry, EOMI,   oropharynx pink and moist.  Neck supple no JVD, no mass.  Chest: Clear to auscultation bilaterally.  CVS: S1, S2 no murmurs, no S3.Regular rate.  ABD: Soft non tender.   Ext: No edema  MS: Decreased  ROM spine,  and knees.  Skin: Intact, no ulcerations or rash noted.  Psych: Good eye contact, normal affect. Memory intact not anxious or depressed appearing.  CNS: CN 2-12 intact, power,  normal  throughout.no focal deficits noted.   Assessment & Plan  Diabetes mellitus type 2 in obese Controlled, no change in medication Kelly Savage is reminded of the importance of commitment to daily physical activity for 30 minutes or more, as able and the need to limit carbohydrate intake to 30 to 60 grams per meal to help with blood sugar control.   The need to take medication as prescribed, test blood sugar as directed, and to call between visits if there is a concern that blood sugar is uncontrolled is also discussed.   Kelly Savage is reminded of the importance of daily foot exam, annual eye examination, and good blood sugar, blood pressure and cholesterol control.  Diabetic Labs Latest Ref Rng & Units 09/09/2016 04/29/2016 04/24/2016 12/03/2015 07/09/2015  HbA1c <5.7 % 6.8(H) - 6.3(H) 7.1(H) 7.3(H)  Microalbumin Not Estab. ug/mL - 4.4(H) - - -  Micro/Creat Ratio 0.0 - 30.0 mg/g creat - 7.6 - - -  Chol <200 mg/dL 086135 - - 578143 -  HDL >46>50 mg/dL 96(E47(L) - - 61 -  Calc LDL <100 mg/dL 64 - - 62 -  Triglycerides <150 mg/dL 952120 - - 841101 -  Creatinine 0.50 - 0.99 mg/dL 3.240.87 - 4.010.77 0.270.79 2.530.85   BP/Weight 09/10/2016 04/29/2016 12/04/2015 07/10/2015 01/30/2015 09/26/2014 04/18/2014  Systolic BP 120 122 100 112 118 118 114  Diastolic BP 76 82 64 78 80 78 64  Wt. (Lbs) 237 237 235 236 234 236  230  BMI 35 35 34.7 34.84 34.54 34.84 33.95   Foot/eye exam completion dates Latest Ref Rng & Units 04/29/2016 02/03/2016  Eye Exam No Retinopathy - No Retinopathy  Foot exam Order - - -  Foot Form Completion - Done -        Hyperlipidemia with target LDL less than 100 Hyperlipidemia:Low fat diet discussed and encouraged.   Lipid Panel  Lab Results  Component Value Date   CHOL 135 09/09/2016   HDL 47 (L) 09/09/2016   LDLCALC 64 09/09/2016   TRIG 120 09/09/2016   CHOLHDL 2.9 09/09/2016   Needs to increase exercise  Obesity Unchnaged Patient re-educated about  the importance of commitment to a  minimum of 150  minutes of exercise per week.  The importance of healthy food choices with portion control discussed. Encouraged to start a food diary, count calories and to consider  joining a support group. Sample diet sheets offered. Goals set by the patient for the next several months.   Weight /BMI 09/10/2016 04/29/2016 12/04/2015  WEIGHT 237 lb 237 lb 235 lb  HEIGHT 5\' 9"  5\' 9"  5\' 9"   BMI 35 kg/m2 35 kg/m2 34.7 kg/m2      Vitamin D deficiency Adequately corrected, change to OTC vit D3 on completion of current script  Knee pain Adequate;ly managed on current regime. [place] registry reviewed, and prescription to last for 4 months provided Weight loss and quad strengthening/ bike riding encouraged  Encounter for pain management Chronic pain control reported as adequate, pt able to function without disabling pain. [place] registry reviewed She will continue current regime

## 2016-09-11 NOTE — Assessment & Plan Note (Signed)
Unchnaged Patient re-educated about  the importance of commitment to a  minimum of 150 minutes of exercise per week.  The importance of healthy food choices with portion control discussed. Encouraged to start a food diary, count calories and to consider  joining a support group. Sample diet sheets offered. Goals set by the patient for the next several months.   Weight /BMI 09/10/2016 04/29/2016 12/04/2015  WEIGHT 237 lb 237 lb 235 lb  HEIGHT 5\' 9"  5\' 9"  5\' 9"   BMI 35 kg/m2 35 kg/m2 34.7 kg/m2

## 2016-09-11 NOTE — Assessment & Plan Note (Signed)
Chronic pain control reported as adequate, pt able to function without disabling pain. [place] registry reviewed She will continue current regime

## 2016-09-11 NOTE — Assessment & Plan Note (Signed)
Hyperlipidemia:Low fat diet discussed and encouraged.   Lipid Panel  Lab Results  Component Value Date   CHOL 135 09/09/2016   HDL 47 (L) 09/09/2016   LDLCALC 64 09/09/2016   TRIG 120 09/09/2016   CHOLHDL 2.9 09/09/2016   Needs to increase exercise

## 2016-09-11 NOTE — Assessment & Plan Note (Signed)
Adequate;ly managed on current regime. [place] registry reviewed, and prescription to last for 4 months provided Weight loss and quad strengthening/ bike riding encouraged

## 2016-10-01 MED FILL — JANUMET XR 50-1,000 MG TAB: 50-1000 | 30 days supply | Qty: 60 | Fill #6

## 2016-10-06 ENCOUNTER — Other Ambulatory Visit: Payer: Self-pay | Admitting: Family Medicine

## 2016-10-06 DIAGNOSIS — Z1231 Encounter for screening mammogram for malignant neoplasm of breast: Secondary | ICD-10-CM

## 2016-10-14 ENCOUNTER — Ambulatory Visit
Admission: RE | Admit: 2016-10-14 | Discharge: 2016-10-14 | Disposition: A | Discharge status: Home / Self Care | Payer: 59 | Source: Ambulatory Visit | Attending: Family Medicine | Admitting: Family Medicine

## 2016-10-14 DIAGNOSIS — Z1231 Encounter for screening mammogram for malignant neoplasm of breast: Secondary | ICD-10-CM

## 2016-10-23 MED FILL — GABAPENTIN 300 MG CAPSULE: 300 | 90 days supply | Qty: 90 | Fill #2

## 2016-10-27 DIAGNOSIS — M170 Bilateral primary osteoarthritis of knee: Secondary | ICD-10-CM | POA: Diagnosis not present

## 2016-11-02 MED FILL — JANUMET XR 50-1,000 MG TAB: 50-1000 | 30 days supply | Qty: 60 | Fill #7

## 2016-11-10 ENCOUNTER — Telehealth: Payer: Self-pay | Admitting: Family Medicine

## 2016-11-10 NOTE — Telephone Encounter (Signed)
I will not.  Most sinus infections are viral and I recommend symptomatic treatment.  Perhaps Dr Lodema HongSimpson will.

## 2016-11-10 NOTE — Telephone Encounter (Signed)
Patient left message on nurse line. States she thinks she has a sinus infection. Complaints of headache, dark yellow drainage, sore throat, generalized body aches. She states she is going out of town tomorrow and wants to know if amoxicillin can be called in to her pharmacy?

## 2016-11-10 NOTE — Telephone Encounter (Signed)
Patient informed of message below, verbalized understanding. Informed her that there are OTC options, or UC since Dr Lodema HongSimpson is out of office this week

## 2016-12-04 ENCOUNTER — Other Ambulatory Visit: Payer: Self-pay | Admitting: Family Medicine

## 2016-12-07 ENCOUNTER — Other Ambulatory Visit: Payer: Self-pay

## 2016-12-07 MED ORDER — GABAPENTIN 300 MG PO CAPS
300.0000 mg | ORAL_CAPSULE | Freq: Every day | ORAL | 3 refills | Status: DC
Start: 2016-12-07 — End: 2018-01-31

## 2016-12-07 MED ORDER — GLIPIZIDE ER 10 MG PO TB24: 10.0000 mg | ORAL_TABLET | Freq: Every day | ORAL | 3 refills | Status: DC

## 2016-12-07 MED FILL — INVOKANA 300 MG TABLET: 300 | 90 days supply | Qty: 90 | Fill #0

## 2016-12-07 MED FILL — glipiZIDE ER 2.5 MG TB24: 2.5 | 90 days supply | Qty: 90 | Fill #0

## 2016-12-07 MED FILL — PRAVASTATIN NA 40 MG TAB: 40 | 90 days supply | Qty: 90 | Fill #0

## 2016-12-07 MED FILL — JANUMET XR 50-1,000 MG TAB: 50-1000 | 90 days supply | Qty: 180 | Fill #0

## 2016-12-10 ENCOUNTER — Other Ambulatory Visit: Payer: Self-pay

## 2016-12-10 MED ORDER — GLIPIZIDE ER 10 MG PO TB24: 10.0000 mg | ORAL_TABLET | Freq: Every day | ORAL | 3 refills | Status: DC

## 2016-12-10 MED FILL — glipiZIDE ER 10 MG TB24: 10 | 90 days supply | Qty: 90 | Fill #0

## 2017-01-20 MED FILL — GABAPENTIN 300 MG CAPSULE: 300 | 90 days supply | Qty: 90 | Fill #3

## 2017-02-16 ENCOUNTER — Encounter: Payer: 59 | Admitting: Family Medicine

## 2017-03-12 ENCOUNTER — Other Ambulatory Visit: Payer: Self-pay | Admitting: Family Medicine

## 2017-03-12 MED FILL — glipiZIDE ER 2.5 MG TB24: 2.5 | 90 days supply | Qty: 90 | Fill #1

## 2017-03-12 MED FILL — INVOKANA 300 MG TABLET: 300 | 90 days supply | Qty: 90 | Fill #1

## 2017-03-12 MED FILL — PRAVASTATIN NA 40 MG TAB: 40 | 90 days supply | Qty: 90 | Fill #1

## 2017-03-12 MED FILL — glipiZIDE ER 10 MG TB24: 10 | 90 days supply | Qty: 90 | Fill #1

## 2017-03-12 MED FILL — JANUMET XR 50-1,000 MG TAB: 50-1000 | 90 days supply | Qty: 180 | Fill #1

## 2017-03-16 ENCOUNTER — Other Ambulatory Visit: Payer: Self-pay | Admitting: Family Medicine

## 2017-03-16 MED ORDER — BENAZEPRIL HCL 5 MG PO TABS: 5.0000 mg | ORAL_TABLET | Freq: Every day | ORAL | 1 refills | Status: DC

## 2017-04-13 ENCOUNTER — Encounter: Payer: 59 | Admitting: Family Medicine

## 2017-04-14 DIAGNOSIS — E785 Hyperlipidemia, unspecified: Secondary | ICD-10-CM | POA: Diagnosis not present

## 2017-04-14 DIAGNOSIS — E559 Vitamin D deficiency, unspecified: Secondary | ICD-10-CM | POA: Diagnosis not present

## 2017-04-14 DIAGNOSIS — E669 Obesity, unspecified: Secondary | ICD-10-CM | POA: Diagnosis not present

## 2017-04-14 DIAGNOSIS — E1169 Type 2 diabetes mellitus with other specified complication: Secondary | ICD-10-CM | POA: Diagnosis not present

## 2017-04-15 ENCOUNTER — Encounter: Payer: Self-pay | Admitting: Family Medicine

## 2017-04-15 ENCOUNTER — Ambulatory Visit (INDEPENDENT_AMBULATORY_CARE_PROVIDER_SITE_OTHER): Payer: 59 | Admitting: Family Medicine

## 2017-04-15 VITALS — BP 130/82 | HR 71 | Resp 16 | Ht 69.0 in | Wt 227.0 lb

## 2017-04-15 DIAGNOSIS — E559 Vitamin D deficiency, unspecified: Secondary | ICD-10-CM

## 2017-04-15 DIAGNOSIS — B3731 Acute candidiasis of vulva and vagina: Secondary | ICD-10-CM

## 2017-04-15 DIAGNOSIS — M62830 Muscle spasm of back: Secondary | ICD-10-CM | POA: Diagnosis not present

## 2017-04-15 DIAGNOSIS — R52 Pain, unspecified: Secondary | ICD-10-CM | POA: Diagnosis not present

## 2017-04-15 DIAGNOSIS — E669 Obesity, unspecified: Secondary | ICD-10-CM

## 2017-04-15 DIAGNOSIS — Z1211 Encounter for screening for malignant neoplasm of colon: Secondary | ICD-10-CM | POA: Diagnosis not present

## 2017-04-15 DIAGNOSIS — Z0000 Encounter for general adult medical examination without abnormal findings: Secondary | ICD-10-CM

## 2017-04-15 DIAGNOSIS — B373 Candidiasis of vulva and vagina: Secondary | ICD-10-CM

## 2017-04-15 DIAGNOSIS — E1169 Type 2 diabetes mellitus with other specified complication: Secondary | ICD-10-CM | POA: Diagnosis not present

## 2017-04-15 DIAGNOSIS — E785 Hyperlipidemia, unspecified: Secondary | ICD-10-CM

## 2017-04-15 LAB — POC HEMOCCULT BLD/STL (OFFICE/1-CARD/DIAGNOSTIC): FECAL OCCULT BLD: NEGATIVE

## 2017-04-15 MED ORDER — HYDROCODONE-ACETAMINOPHEN 5-325MG PREPACK (~~LOC~~: ORAL_TABLET | ORAL | 0 refills | Status: DC

## 2017-04-15 MED ORDER — KETOROLAC TROMETHAMINE 60 MG/2ML IM SOLN
60.0000 mg | Freq: Once | INTRAMUSCULAR | Status: AC
  Administered 2017-04-15: 60 mg via INTRAMUSCULAR

## 2017-04-15 MED ORDER — GLUCOSE BLOOD VI STRP: ORAL_STRIP | 3 refills | Status: DC

## 2017-04-15 MED ORDER — METHYLPREDNISOLONE ACETATE 80 MG/ML IJ SUSP
80.0000 mg | Freq: Once | INTRAMUSCULAR | Status: AC
  Administered 2017-04-15: 80 mg via INTRAMUSCULAR

## 2017-04-15 MED ORDER — ERGOCALCIFEROL 1.25 MG (50000 UT) PO CAPS: 50000.0000 [IU] | ORAL_CAPSULE | ORAL | 1 refills | Status: DC

## 2017-04-15 MED ORDER — GLIPIZIDE ER 5 MG PO TB24: 5.0000 mg | ORAL_TABLET | Freq: Every day | ORAL | 3 refills | Status: DC

## 2017-04-15 MED ORDER — CLOTRIMAZOLE 1 % VA CREA: TOPICAL_CREAM | VAGINAL | 1 refills | Status: DC

## 2017-04-15 MED FILL — glipiZIDE XL 5 MG TB24: 5 | 90 days supply | Qty: 90 | Fill #0

## 2017-04-15 MED FILL — FREESTYLE LITE TEST STRIP: 90 days supply | Qty: 100 | Fill #0

## 2017-04-15 MED FILL — VIT D2 1.25 MG (50,000 UNIT: 1.25 MG | 84 days supply | Qty: 12 | Fill #0

## 2017-04-15 NOTE — Patient Instructions (Addendum)
F/u in 4 months, congrats on weight loss and good labs  Good foot exam  We will refill test strips and add cholesterol to labs  Start once weekly vitamin D3 , this is prescribed  STOP invokana  Increase glipizide to 15 mg daily, continue janumet as before  Antifungal cream prescribed, may be used both intravaginally as well as in the skin folds of your groin where you are itching   Toradol and depo medrol IM in office today for back pain, also you will have ibuprofen , and prednisone as well as a muscle relaxant prescribed short term  Be careful, and also reduce the amount of lifting and bending you do as you complete your move!   Non fast hBA1C , chem 7 and microalb 3 to 5 days before next visit, at  [facility]  It is important that you exercise regularly at least 30 minutes 5 times a week. If you develop chest pain, have severe difficulty breathing, or feel very tired, stop exercising immediately and seek medical attention  Thank you  for choosing [facility]. We consider it a privelige to serve you.  Delivering excellent health care in a caring and  compassionate way is our goal.  Partnering with you,  so that together we can achieve this goal is our strategy.

## 2017-04-15 NOTE — Progress Notes (Signed)
Berna SpareCheryl D Koons     MRN: [id number]      DOB: 05/28/1955  HPI: Patient is in for annual physical exam. Increased back pain and spasm x 2 weeks since being involved in moving C/o itchy rash in skin folds of groin also intravaginally Recent labs,  are reviewed.adjustment made in diabetes medication Immunization is reviewed ,holding on shingrix since she is getting steroid injection  PE: BP 130/82   Pulse 71   Resp 16   Ht 5\' 9"  (1.753 m)   Wt 227 lb (103 kg)   SpO2 96%   BMI 33.52 kg/m   Pleasant  female, alert and oriented x 3, in no cardio-pulmonary distress.Pt  In pain when lying flat Afebrile. HEENT No facial trauma or asymetry. Sinuses non tender.  Extra occullar muscles intact,  External ears normal, tympanic membranes clear. Oropharynx moist, no exudate. Neck: supple, no adenopathy,JVD or thyromegaly.No bruits.  Chest: Clear to ascultation bilaterally.No crackles or wheezes. Non tender to palpation  Breast: No asymetry,no masses or lumps. No tenderness. No nipple discharge or inversion. No axillary or supraclavicular adenopathy  Cardiovascular system; Heart sounds normal,  S1 and  S2 ,no S3.  No murmur, or thrill. Apical beat not displaced Peripheral pulses normal.  Abdomen: Soft, non tender, no organomegaly or masses. No bruits. Bowel sounds normal. No guarding, tenderness or rebound.  Rectal:  Normal sphincter tone. No rectal mass. Guaiac negative stool.  GU:  Not examined  Musculoskeletal exam: Decreased  ROM of lumbar spine, normal in  hips , shoulders and knees. No deformity ,swelling or crepitus noted. No muscle wasting or atrophy.   Neurologic: Cranial nerves 2 to 12 intact. Power, tone ,sensation and reflexes normal throughout. No disturbance in gait. No tremor.  Skin: Intact, no ulceration, erythema , scaling or rash noted. Pigmentation normal throughout  Psych; Normal mood and affect. Judgement and concentration  normal   Assessment & Plan:  Annual physical exam Annual exam as documented. Counseling done  re healthy lifestyle involving commitment to 150 minutes exercise per week, heart healthy diet, and attaining healthy weight.The importance of adequate sleep also discussed. Regular seat belt use and home safety, is also discussed. Changes in health habits are decided on by the patient with goals and time frames  set for achieving them. Immunization and cancer screening needs are specifically addressed at this visit.   Back spasm Uncontrolled.Toradol and depo medrol administered IM in the office , to be followed by a short course of oral prednisone and NSAIDS.   Diabetes mellitus type 2 in obese Ms. Hopfensperger is reminded of the importance of commitment to daily physical activity for 30 minutes or more, as able and the need to limit carbohydrate intake to 30 to 60 grams per meal to help with blood sugar control.   The need to take medication as prescribed, test blood sugar as directed, and to call between visits if there is a concern that blood sugar is uncontrolled is also discussed.   [name] is reminded of the importance of daily foot exam, annual eye examination, and good blood sugar, blood pressure and cholesterol control. Stop invokana. Increase glipizide dose  Diabetic Labs Latest Ref Rng & Units 04/14/2017 09/09/2016 04/29/2016 04/24/2016 12/03/2015  HbA1c <5.7 % of total Hgb 6.9(H) 6.8(H) - 6.3(H) 7.1(H)  Microalbumin Not Estab. ug/mL - - 4.4(H) - -  Micro/Creat Ratio 0.0 - 30.0 mg/g creat - - 7.6 - -  Chol <200 mg/dL 782145 956135 - -  143  HDL >50 mg/dL 59 64(Q47(L) - - 61  Calc LDL <100 mg/dL - 64 - - 62  Triglycerides <150 mg/dL 034100 742120 - - 595101  Creatinine 0.50 - 0.99 mg/dL 6.380.73 7.560.87 - 4.330.77 2.950.79   BP/Weight 04/15/2017 09/10/2016 04/29/2016 12/04/2015 07/10/2015 01/30/2015 09/26/2014  Systolic BP 130 120 122 100 112 118 118  Diastolic BP 82 76 82 64 78 80 78  Wt. (Lbs) 227 237 237 235 236 234 236   BMI 33.52 35 35 34.7 34.84 34.54 34.84   Foot/eye exam completion dates Latest Ref Rng & Units 04/15/2017 04/29/2016  Eye Exam No Retinopathy - -  Foot exam Order - - -  Foot Form Completion - Done Done        Vulvovaginal candidiasis C/o itchy rash in groin and intravaginal itch, pt is diabetic and has been on invokana, antifungal prescribed foe intravaginal use as well as use externally  Vitamin D deficiency Once weekly vitamin D supplement prescribed  Encounter for pain management The patient's Controlled Substance registry is reviewed and compliance confirmed. Adequacy of  Pain control and level of function is assessed. Medication dosing is adjusted as deemed appropriate. Twelve weeks of medication is prescribed , patient signs for the script and is provided with a follow up appointment between 12 to 16 weeks .

## 2017-04-16 LAB — COMPLETE METABOLIC PANEL WITH GFR
AG Ratio: 1.8 (calc) (ref 1.0–2.5)
ALKALINE PHOSPHATASE (APISO): 53 U/L (ref 33–130)
ALT: 18 U/L (ref 6–29)
AST: 19 U/L (ref 10–35)
Albumin: 4.2 g/dL (ref 3.6–5.1)
BILIRUBIN TOTAL: 1.3 mg/dL — AB (ref 0.2–1.2)
BUN: 15 mg/dL (ref 7–25)
CHLORIDE: 104 mmol/L (ref 98–110)
CO2: 26 mmol/L (ref 20–32)
CREATININE: 0.73 mg/dL (ref 0.50–0.99)
Calcium: 9.6 mg/dL (ref 8.6–10.4)
GFR, Est African American: 103 mL/min/{1.73_m2} (ref >=60)
GFR, Est Non African American: 89 mL/min/{1.73_m2} (ref >=60)
GLUCOSE: 93 mg/dL (ref 65–99)
Globulin: 2.4 g/dL (calc) (ref 1.9–3.7)
Potassium: 4.1 mmol/L (ref 3.5–5.3)
SODIUM: 140 mmol/L (ref 135–146)
Total Protein: 6.6 g/dL (ref 6.1–8.1)

## 2017-04-16 LAB — CBC
HEMATOCRIT: 40.2 % (ref 35.0–45.0)
Hemoglobin: 13.5 g/dL (ref 11.7–15.5)
MCH: 29.3 pg (ref 27.0–33.0)
MCHC: 33.6 g/dL (ref 32.0–36.0)
MCV: 87.4 fL (ref 80.0–100.0)
MPV: 11.9 fL (ref 7.5–12.5)
Platelets: 226 10*3/uL (ref 140–400)
RBC: 4.6 10*6/uL (ref 3.80–5.10)
RDW: 13.5 % (ref 11.0–15.0)
WBC: 6.5 10*3/uL (ref 3.8–10.8)

## 2017-04-16 LAB — LIPID PANEL
CHOL/HDL RATIO: 2.5 (calc) (ref <5.0)
CHOLESTEROL: 145 mg/dL (ref <200)
HDL: 59 mg/dL (ref >50)
LDL CHOLESTEROL (CALC): 67 mg/dL
Non-HDL Cholesterol (Calc): 86 mg/dL (calc) (ref <130)
Triglycerides: 100 mg/dL (ref <150)

## 2017-04-16 LAB — VITAMIN D 25 HYDROXY (VIT D DEFICIENCY, FRACTURES): VIT D 25 HYDROXY: 25 ng/mL — AB (ref 30–100)

## 2017-04-16 LAB — HEMOGLOBIN A1C
EAG (MMOL/L): 8.4 (calc)
Hgb A1c MFr Bld: 6.9 % of total Hgb — ABNORMAL HIGH (ref <5.7)
Mean Plasma Glucose: 151 (calc)

## 2017-04-16 MED FILL — HYDROCODON-APAP 5-325: 5-325 | 90 days supply | Qty: 90 | Fill #0

## 2017-04-17 DIAGNOSIS — B3731 Acute candidiasis of vulva and vagina: Secondary | ICD-10-CM | POA: Insufficient documentation

## 2017-04-17 DIAGNOSIS — B373 Candidiasis of vulva and vagina: Secondary | ICD-10-CM | POA: Insufficient documentation

## 2017-04-17 NOTE — Assessment & Plan Note (Signed)

## 2017-04-17 NOTE — Assessment & Plan Note (Signed)
C/o itchy rash in groin and intravaginal itch, pt is diabetic and has been on invokana, antifungal prescribed foe intravaginal use as well as use externally

## 2017-04-17 NOTE — Assessment & Plan Note (Signed)
Uncontrolled.Toradol and depo medrol administered IM in the office , to be followed by a short course of oral prednisone and NSAIDS.  

## 2017-04-17 NOTE — Assessment & Plan Note (Addendum)
The patient's Controlled Substance registry is reviewed and compliance confirmed. Adequacy of  Pain control and level of function is assessed. Medication dosing is adjusted as deemed appropriate. Twelve weeks of medication is prescribed , patient signs for the script and is provided with a follow up appointment between 12 to 16 weeks .  

## 2017-04-17 NOTE — Assessment & Plan Note (Signed)
Once weekly vitamin D supplement prescribed

## 2017-04-17 NOTE — Assessment & Plan Note (Signed)
Kelly Savage is reminded of the importance of commitment to daily physical activity for 30 minutes or more, as able and the need to limit carbohydrate intake to 30 to 60 grams per meal to help with blood sugar control.   The need to take medication as prescribed, test blood sugar as directed, and to call between visits if there is a concern that blood sugar is uncontrolled is also discussed.   Kelly Savage is reminded of the importance of daily foot exam, annual eye examination, and good blood sugar, blood pressure and cholesterol control. Stop invokana. Increase glipizide dose  Diabetic Labs Latest Ref Rng & Units 04/14/2017 09/09/2016 04/29/2016 04/24/2016 12/03/2015  HbA1c <5.7 % of total Hgb 6.9(H) 6.8(H) - 6.3(H) 7.1(H)  Microalbumin Not Estab. ug/mL - - 4.4(H) - -  Micro/Creat Ratio 0.0 - 30.0 mg/g creat - - 7.6 - -  Chol <200 mg/dL 409145 811135 - - 914143  HDL >78>50 mg/dL 59 29(F47(L) - - 61  Calc LDL <100 mg/dL - 64 - - 62  Triglycerides <150 mg/dL 621100 308120 - - 657101  Creatinine 0.50 - 0.99 mg/dL 8.460.73 9.620.87 - 9.520.77 8.410.79   BP/Weight 04/15/2017 09/10/2016 04/29/2016 12/04/2015 07/10/2015 01/30/2015 09/26/2014  Systolic BP 130 120 122 100 112 118 118  Diastolic BP 82 76 82 64 78 80 78  Wt. (Lbs) 227 237 237 235 236 234 236  BMI 33.52 35 35 34.7 34.84 34.54 34.84   Foot/eye exam completion dates Latest Ref Rng & Units 04/15/2017 04/29/2016  Eye Exam No Retinopathy - -  Foot exam Order - - -  Foot Form Completion - Done Done

## 2017-04-22 MED FILL — GABAPENTIN 300 MG CAPSULE: 300 | 90 days supply | Qty: 90 | Fill #0

## 2017-04-30 ENCOUNTER — Other Ambulatory Visit: Payer: Self-pay

## 2017-04-30 DIAGNOSIS — E1169 Type 2 diabetes mellitus with other specified complication: Secondary | ICD-10-CM

## 2017-04-30 DIAGNOSIS — E669 Obesity, unspecified: Principal | ICD-10-CM

## 2017-04-30 MED ORDER — GLUCOSE BLOOD VI STRP: ORAL_STRIP | 3 refills | Status: DC

## 2017-05-11 MED FILL — FREESTYLE LITE METER: 30 days supply | Qty: 1 | Fill #0

## 2017-05-13 DIAGNOSIS — M545 Low back pain: Secondary | ICD-10-CM | POA: Diagnosis not present

## 2017-05-13 DIAGNOSIS — M25552 Pain in left hip: Secondary | ICD-10-CM | POA: Diagnosis not present

## 2017-05-13 DIAGNOSIS — M170 Bilateral primary osteoarthritis of knee: Secondary | ICD-10-CM | POA: Diagnosis not present

## 2017-05-25 DIAGNOSIS — H524 Presbyopia: Secondary | ICD-10-CM | POA: Diagnosis not present

## 2017-05-25 DIAGNOSIS — H52203 Unspecified astigmatism, bilateral: Secondary | ICD-10-CM | POA: Diagnosis not present

## 2017-05-25 DIAGNOSIS — E119 Type 2 diabetes mellitus without complications: Secondary | ICD-10-CM | POA: Diagnosis not present

## 2017-05-25 DIAGNOSIS — H2513 Age-related nuclear cataract, bilateral: Secondary | ICD-10-CM | POA: Diagnosis not present

## 2017-05-25 LAB — HM DIABETES EYE EXAM

## 2017-06-07 MED FILL — JANUMET XR 50-1,000 MG TAB: 50-1000 | 90 days supply | Qty: 180 | Fill #2

## 2017-06-07 MED FILL — PRAVASTATIN NA 40 MG TAB: 40 | 90 days supply | Qty: 90 | Fill #2

## 2017-06-07 MED FILL — glipiZIDE ER 10 MG TB24: 10 | 90 days supply | Qty: 90 | Fill #2

## 2017-07-22 MED FILL — GABAPENTIN 300 MG CAPSULE: 300 | 90 days supply | Qty: 90 | Fill #1

## 2017-07-22 MED FILL — VIT D2 1.25 MG (50,000 UNIT: 1.25 MG | 84 days supply | Qty: 12 | Fill #1

## 2017-07-23 ENCOUNTER — Telehealth: Payer: Self-pay | Admitting: Family Medicine

## 2017-07-23 NOTE — Telephone Encounter (Signed)
Called patient to r/s her appt per request on voicemail, no answer, left voicemail.

## 2017-08-16 ENCOUNTER — Ambulatory Visit: Payer: 59 | Admitting: Family Medicine

## 2017-08-23 MED FILL — glipiZIDE XL 5 MG TB24: 5 | 90 days supply | Qty: 90 | Fill #1

## 2017-08-23 MED FILL — glipiZIDE ER 10 MG TB24: 10 | 90 days supply | Qty: 90 | Fill #3

## 2017-08-23 MED FILL — JANUMET XR 50-1,000 MG TAB: 50-1000 | 90 days supply | Qty: 180 | Fill #3

## 2017-08-23 MED FILL — PRAVASTATIN NA 40 MG TAB: 40 | 90 days supply | Qty: 90 | Fill #3

## 2017-08-25 DIAGNOSIS — M170 Bilateral primary osteoarthritis of knee: Secondary | ICD-10-CM | POA: Diagnosis not present

## 2017-08-25 MED FILL — DICLOFENAC SOD EC 50 MG TAB: 50 | 30 days supply | Qty: 60 | Fill #0

## 2017-09-02 ENCOUNTER — Ambulatory Visit: Payer: 59 | Admitting: Family Medicine

## 2017-09-09 ENCOUNTER — Encounter: Payer: Self-pay | Admitting: Family Medicine

## 2017-09-09 ENCOUNTER — Other Ambulatory Visit: Payer: Self-pay | Admitting: Family Medicine

## 2017-09-09 ENCOUNTER — Telehealth: Payer: Self-pay | Admitting: Family Medicine

## 2017-09-09 DIAGNOSIS — E1169 Type 2 diabetes mellitus with other specified complication: Secondary | ICD-10-CM

## 2017-09-09 DIAGNOSIS — E669 Obesity, unspecified: Principal | ICD-10-CM

## 2017-09-09 DIAGNOSIS — Z01810 Encounter for preprocedural cardiovascular examination: Secondary | ICD-10-CM

## 2017-09-09 DIAGNOSIS — E785 Hyperlipidemia, unspecified: Secondary | ICD-10-CM

## 2017-09-09 DIAGNOSIS — E559 Vitamin D deficiency, unspecified: Secondary | ICD-10-CM

## 2017-09-09 NOTE — Telephone Encounter (Signed)
Please order CBC, fasting lipid, cmp and eGFR, microalb, CCUA HBA1C, TSH and vit D, send to [facility] in [place], she will go tomorrow or Monday, thank you. I have spoken to her Thanks

## 2017-09-09 NOTE — Addendum Note (Signed)
Addended by: Abner Greenspan on: 09/09/2017 03:02 PM   Modules accepted: Orders

## 2017-09-13 DIAGNOSIS — E785 Hyperlipidemia, unspecified: Secondary | ICD-10-CM | POA: Diagnosis not present

## 2017-09-13 DIAGNOSIS — E669 Obesity, unspecified: Secondary | ICD-10-CM | POA: Diagnosis not present

## 2017-09-13 DIAGNOSIS — E559 Vitamin D deficiency, unspecified: Secondary | ICD-10-CM | POA: Diagnosis not present

## 2017-09-13 DIAGNOSIS — E1169 Type 2 diabetes mellitus with other specified complication: Secondary | ICD-10-CM | POA: Diagnosis not present

## 2017-09-14 LAB — CBC
HEMATOCRIT: 41 % (ref 35.0–45.0)
Hemoglobin: 13.9 g/dL (ref 11.7–15.5)
MCH: 30 pg (ref 27.0–33.0)
MCHC: 33.9 g/dL (ref 32.0–36.0)
MCV: 88.6 fL (ref 80.0–100.0)
MPV: 12.1 fL (ref 7.5–12.5)
PLATELETS: 238 10*3/uL (ref 140–400)
RBC: 4.63 10*6/uL (ref 3.80–5.10)
RDW: 13 % (ref 11.0–15.0)
WBC: 5.6 10*3/uL (ref 3.8–10.8)

## 2017-09-14 LAB — LIPID PANEL
CHOL/HDL RATIO: 2.6 (calc) (ref <5.0)
Cholesterol: 141 mg/dL (ref <200)
HDL: 55 mg/dL (ref >50)
LDL CHOLESTEROL (CALC): 70 mg/dL
NON-HDL CHOLESTEROL (CALC): 86 mg/dL (ref <130)
Triglycerides: 79 mg/dL (ref <150)

## 2017-09-14 LAB — MICROALBUMIN / CREATININE URINE RATIO
Creatinine, Urine: 71 mg/dL (ref 20–275)
MICROALB/CREAT RATIO: 6 ug/mg{creat} (ref <30)
Microalb, Ur: 0.4 mg/dL

## 2017-09-14 LAB — URINALYSIS
Bilirubin Urine: NEGATIVE
Hgb urine dipstick: NEGATIVE
Ketones, ur: NEGATIVE
LEUKOCYTES UA: NEGATIVE
NITRITE: NEGATIVE
PROTEIN: NEGATIVE
Specific Gravity, Urine: 1.045 — ABNORMAL HIGH (ref 1.001–1.03)
pH: <=5 (ref 5.0–8.0)

## 2017-09-14 LAB — COMPLETE METABOLIC PANEL WITH GFR
AG Ratio: 2 (calc) (ref 1.0–2.5)
ALBUMIN MSPROF: 4.3 g/dL (ref 3.6–5.1)
ALKALINE PHOSPHATASE (APISO): 58 U/L (ref 33–130)
ALT: 20 U/L (ref 6–29)
AST: 18 U/L (ref 10–35)
BILIRUBIN TOTAL: 1.3 mg/dL — AB (ref 0.2–1.2)
BUN: 17 mg/dL (ref 7–25)
CHLORIDE: 104 mmol/L (ref 98–110)
CO2: 26 mmol/L (ref 20–32)
Calcium: 9.4 mg/dL (ref 8.6–10.4)
Creat: 0.83 mg/dL (ref 0.50–0.99)
GFR, Est African American: 88 mL/min/{1.73_m2} (ref >=60)
GFR, Est Non African American: 76 mL/min/{1.73_m2} (ref >=60)
GLUCOSE: 148 mg/dL — AB (ref 65–99)
Globulin: 2.2 g/dL (calc) (ref 1.9–3.7)
Potassium: 4.3 mmol/L (ref 3.5–5.3)
Sodium: 139 mmol/L (ref 135–146)
Total Protein: 6.5 g/dL (ref 6.1–8.1)

## 2017-09-14 LAB — HEMOGLOBIN A1C
EAG (MMOL/L): 9.5 (calc)
HEMOGLOBIN A1C: 7.6 %{Hb} — AB (ref <5.7)
MEAN PLASMA GLUCOSE: 171 (calc)

## 2017-09-14 LAB — TSH: TSH: 1.55 m[IU]/L (ref 0.40–4.50)

## 2017-09-14 LAB — VITAMIN D 25 HYDROXY (VIT D DEFICIENCY, FRACTURES): Vit D, 25-Hydroxy: 46 ng/mL (ref 30–100)

## 2017-09-17 ENCOUNTER — Ambulatory Visit (INDEPENDENT_AMBULATORY_CARE_PROVIDER_SITE_OTHER): Payer: 59 | Admitting: Cardiovascular Disease

## 2017-09-17 ENCOUNTER — Encounter: Payer: Self-pay | Admitting: Cardiovascular Disease

## 2017-09-17 VITALS — BP 130/86 | HR 83 | Ht 68.0 in | Wt 236.4 lb

## 2017-09-17 DIAGNOSIS — E119 Type 2 diabetes mellitus without complications: Secondary | ICD-10-CM

## 2017-09-17 DIAGNOSIS — I4510 Unspecified right bundle-branch block: Secondary | ICD-10-CM

## 2017-09-17 DIAGNOSIS — I444 Left anterior fascicular block: Secondary | ICD-10-CM

## 2017-09-17 DIAGNOSIS — Z01818 Encounter for other preprocedural examination: Secondary | ICD-10-CM | POA: Diagnosis not present

## 2017-09-17 DIAGNOSIS — Z01810 Encounter for preprocedural cardiovascular examination: Secondary | ICD-10-CM

## 2017-09-17 DIAGNOSIS — E785 Hyperlipidemia, unspecified: Secondary | ICD-10-CM

## 2017-09-17 NOTE — Progress Notes (Signed)
CARDIOLOGY CONSULT NOTE  Patient ID: GENESYS COGGESHALL MRN: [id number] DOB/AGE: 11-May-1955 62 y.o.  Admit date: (Not on file) Primary Physician: Kerri Perches, MD Referring Physician: Dr. Lodema Hong  Reason for Consultation: Cardiovascular risk assessment and preoperative risk stratification  HPI: Kelly Savage is a 62 y.o. female who is being seen today for the evaluation of cardiovascular risk assessment and preoperative risk stratification at the request of Kerri Perches, MD.  Past medical history includes type 2 diabetes mellitus and hyperlipidemia.  Lipids 09/13/17: TC 141, HDL 55, TG 79, LDL 70.  Additional labs: BUN 17, creatinine 0.83, Hgb 13.9, platelets 238, TSH 1.55, A1C 7.6%.  She has bilateral knee pain and needs to undergo bilateral knee replacement but plans to undergo left knee replacement surgery first on 10/18/2017.  The patient denies any symptoms of chest pain, palpitations, shortness of breath, lightheadedness, dizziness, orthopnea, PND, and syncope.  She stays very active.  She has a flight of stairs at home and climbs it frequently without any activity limiting symptoms.  She takes benazepril as she has type 2 diabetes mellitus but does not carry a diagnosis of hypertension.  She had been receiving steroid injections which raised her blood sugars and she restarted Invokana on her own.  She denies exertional fatigue.  ECG performed in our office today which I personally interpreted demonstrates sinus rhythm with right bundle branch block and left anterior fascicular block.  There is late R wave transition.  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  She is married.  She has adult children.   Family history: Mother developed atrial fibrillation at the age of 37.  She subsequently sustained a CVA and myocardial infarction.  She was a smoker.  Her daughter is 11 years old and is a Runner, broadcasting/film/video in [place].  She  also has a right bundle branch block and has had issues with tachycardia.   No Known Allergies  Current Outpatient Medications  Medication Sig Dispense Refill  . benazepril (LOTENSIN) 5 MG tablet Take 1 tablet (5 mg total) daily by mouth. 90 tablet 1  . Calcium Carbonate-Vit D-Min (CALCIUM 1200 PO) Take 1 tablet by mouth daily.      . clotrimazole (V-R CLOTRIMAZOLE VAGINAL) 1 % vaginal cream Apply twice daily intravaginally for 1 week, and you may also use on rash in groin twice daily 90 g 1  . ergocalciferol (VITAMIN D2) 50000 units capsule Take 1 capsule (50,000 Units total) by mouth once a week. One capsule once weekly 12 capsule 1  . gabapentin (NEURONTIN) 300 MG capsule Take 1 capsule (300 mg total) by mouth at bedtime. 90 capsule 3  . glipiZIDE (GLUCOTROL XL) 10 MG 24 hr tablet Take 1 tablet (10 mg total) by mouth daily with breakfast. 90 tablet 3  . glipiZIDE (GLUCOTROL XL) 5 MG 24 hr tablet Take 1 tablet (5 mg total) by mouth daily with breakfast. 90 tablet 3  . glucose blood test strip Freestyle Lite meter. Use as instructed once daily testing dx e11.9 100 each 3  . HYDROcodone-acetaminophen (VICODIN) 5-325 mg TABS tablet One tablet once daily as needed, for uncontrolled knee pain. Ninety tablets to last 4 months 90 tablet 0  . ibuprofen (ADVIL,MOTRIN) 800 MG tablet Take 1 tablet (800 mg total) by mouth every 8 (eight) hours as needed. Sixty tablets to last 4 months 60 tablet 1  . JANUMET XR 50-1000 MG TB24 TAKE 1  TABLET BY MOUTH 2 TIMES DAILY. 180 tablet PRN  . ONETOUCH DELICA LANCETS MISC Once daily testing 100 each 11  . pravastatin (PRAVACHOL) 40 MG tablet TAKE 1 TABLET BY MOUTH EVERY EVENING. 90 tablet PRN   No current facility-administered medications for this visit.     Past Medical History:  Diagnosis Date  . Ankle pain x3 months   bilateral  . Depression    Due to loss of mother had a fib massive stroke, died after , she was her best friend   . Diabetes mellitus type  II 2006  . Obesity all her life    lost 28 lbs in 14 weeks   . Right thigh pain   . Thigh pain x 1 year    bilateral, esp when laying down, worse in the morning , and goes away with activity    Past Surgical History:  Procedure Laterality Date  . APPENDECTOMY  2000  . Cholecysectomy  1998  . TONSILLECTOMY     in childhood    Social History   Socioeconomic History  . Marital status: Married    Spouse name: Not on file  . Number of children: 2  . Years of education: Not on file  . Highest education level: Not on file  Occupational History  . Not on file  Social Needs  . Financial resource strain: Not on file  . Food insecurity:    Worry: Not on file    Inability: Not on file  . Transportation needs:    Medical: Not on file    Non-medical: Not on file  Tobacco Use  . Smoking status: Never Smoker  . Smokeless tobacco: Never Used  Substance and Sexual Activity  . Alcohol use: Not on file  . Drug use: Not on file  . Sexual activity: Not on file  Lifestyle  . Physical activity:    Days per week: Not on file    Minutes per session: Not on file  . Stress: Not on file  Relationships  . Social connections:    Talks on phone: Not on file    Gets together: Not on file    Attends religious service: Not on file    Active member of club or organization: Not on file    Attends meetings of clubs or organizations: Not on file    Relationship status: Not on file  . Intimate partner violence:    Fear of current or ex partner: Not on file    Emotionally abused: Not on file    Physically abused: Not on file    Forced sexual activity: Not on file  Other Topics Concern  . Not on file  Social History Narrative  . Not on file     No family history of premature CAD in 1st degree relatives.  No outpatient medications have been marked as taking for the 09/17/17 encounter (Office Visit) with Laqueta Linden, MD.      Review of systems complete and found to be negative  unless listed above in HPI    Physical exam Height  (1.727 m), weight 236 lb 6.4 oz (107.2 kg). General: NAD Neck: No JVD, no thyromegaly or thyroid nodule.  Lungs: Clear to auscultation bilaterally with normal respiratory effort. CV: Nondisplaced PMI. Regular rate and rhythm, normal S1/S2, no S3/S4, no murmur.  No peripheral edema.  No carotid bruit.   Abdomen: Soft, nontender, no distention.  Skin: Intact without lesions or rashes.  Neurologic: Alert and oriented  x 3.  Psych: Normal affect. Extremities: No clubbing or cyanosis.  HEENT: Normal.   ECG: Most recent ECG reviewed.   Labs: Lab Results  Component Value Date/Time   K 4.3 09/13/2017 10:13 AM   BUN 17 09/13/2017 10:13 AM   CREATININE 0.83 09/13/2017 10:13 AM   ALT 20 09/13/2017 10:13 AM   TSH 1.55 09/13/2017 10:13 AM   HGB 13.9 09/13/2017 10:13 AM     Lipids: Lab Results  Component Value Date/Time   LDLCALC 70 09/13/2017 10:13 AM   CHOL 141 09/13/2017 10:13 AM   TRIG 79 09/13/2017 10:13 AM   HDL 55 09/13/2017 10:13 AM        ASSESSMENT AND PLAN:  1.  Preoperative risk stratification with abnormal ECG (right bundle branch block and left anterior fascicular block): She is symptomatically stable.  She is scheduled for left knee replacement surgery on 10/18/2017.   She is able to climb a flight of stairs without exertional symptoms thus being able to achieve 4 METS.  I do not feel she warrants stress testing. I will order a 2-D echocardiogram with Doppler to evaluate cardiac structure, function, and regional wall motion.  2.  Hyperlipidemia: Lipids reviewed above.  Continue pravastatin 40 mg.  3.  Type 2 diabetes mellitus: HbA1c mildly elevated.  She is currently on glipizide, Janumet, and benazepril.    Disposition: Follow up to be determined  Signed: Prentice Docker, M.D., F.A.C.C.  09/17/2017, 3:46 PM

## 2017-09-17 NOTE — Patient Instructions (Addendum)
Medication Instructions:   Your physician recommends that you continue on your current medications as directed. Please refer to the Current Medication list given to you today.  Labwork:  NONE  Testing/Procedures: Your physician has requested that you have an echocardiogram. Echocardiography is a painless test that uses sound waves to create images of your heart. It provides your doctor with information about the size and shape of your heart and how well your heart's chambers and valves are working. This procedure takes approximately one hour. There are no restrictions for this procedure.  Follow-Up:  Your physician recommends that you schedule a follow-up appointment in: pending echo result.  Any Other Special Instructions Will Be Listed Below (If Applicable).  If you need a refill on your cardiac medications before your next appointment, please call your pharmacy. 

## 2017-09-19 ENCOUNTER — Encounter: Payer: Self-pay | Admitting: Family Medicine

## 2017-09-20 ENCOUNTER — Ambulatory Visit (HOSPITAL_COMMUNITY)
Admission: RE | Admit: 2017-09-20 | Discharge: 2017-09-20 | Disposition: A | Discharge status: Home / Self Care | Payer: 59 | Source: Ambulatory Visit | Attending: Cardiovascular Disease | Admitting: Cardiovascular Disease

## 2017-09-20 ENCOUNTER — Ambulatory Visit (HOSPITAL_COMMUNITY)
Admission: RE | Admit: 2017-09-20 | Discharge: 2017-09-20 | Disposition: A | Discharge status: Home / Self Care | Payer: 59 | Source: Ambulatory Visit | Attending: Family Medicine | Admitting: Family Medicine

## 2017-09-20 ENCOUNTER — Encounter: Payer: Self-pay | Admitting: Family Medicine

## 2017-09-20 ENCOUNTER — Ambulatory Visit (INDEPENDENT_AMBULATORY_CARE_PROVIDER_SITE_OTHER): Payer: 59 | Admitting: Family Medicine

## 2017-09-20 VITALS — BP 122/82 | HR 66 | Resp 16 | Ht 68.0 in | Wt 235.0 lb

## 2017-09-20 DIAGNOSIS — E119 Type 2 diabetes mellitus without complications: Secondary | ICD-10-CM | POA: Diagnosis not present

## 2017-09-20 DIAGNOSIS — Z01811 Encounter for preprocedural respiratory examination: Secondary | ICD-10-CM

## 2017-09-20 DIAGNOSIS — I4510 Unspecified right bundle-branch block: Secondary | ICD-10-CM | POA: Insufficient documentation

## 2017-09-20 DIAGNOSIS — I771 Stricture of artery: Secondary | ICD-10-CM | POA: Diagnosis not present

## 2017-09-20 DIAGNOSIS — E1159 Type 2 diabetes mellitus with other circulatory complications: Secondary | ICD-10-CM

## 2017-09-20 DIAGNOSIS — E785 Hyperlipidemia, unspecified: Secondary | ICD-10-CM | POA: Diagnosis not present

## 2017-09-20 DIAGNOSIS — Z6835 Body mass index (BMI) 35.0-35.9, adult: Secondary | ICD-10-CM | POA: Insufficient documentation

## 2017-09-20 DIAGNOSIS — I119 Hypertensive heart disease without heart failure: Secondary | ICD-10-CM | POA: Insufficient documentation

## 2017-09-20 DIAGNOSIS — E669 Obesity, unspecified: Secondary | ICD-10-CM

## 2017-09-20 DIAGNOSIS — Z01810 Encounter for preprocedural cardiovascular examination: Secondary | ICD-10-CM | POA: Insufficient documentation

## 2017-09-20 DIAGNOSIS — Z01818 Encounter for other preprocedural examination: Secondary | ICD-10-CM

## 2017-09-20 DIAGNOSIS — I10 Essential (primary) hypertension: Secondary | ICD-10-CM

## 2017-09-20 DIAGNOSIS — E1169 Type 2 diabetes mellitus with other specified complication: Secondary | ICD-10-CM | POA: Diagnosis not present

## 2017-09-20 DIAGNOSIS — M170 Bilateral primary osteoarthritis of knee: Secondary | ICD-10-CM | POA: Diagnosis not present

## 2017-09-20 DIAGNOSIS — Z1231 Encounter for screening mammogram for malignant neoplasm of breast: Secondary | ICD-10-CM | POA: Diagnosis not present

## 2017-09-20 LAB — ECHOCARDIOGRAM COMPLETE
Height: 68 in
Weight: 3760 oz

## 2017-09-20 NOTE — Patient Instructions (Addendum)
F/u in 3.5 months, we will mail the appt and lab order will be sent also  Increase the glipizide to 15 mg once daily total and continue janumet as before  Please stop invokana  I am referring you to diabetic educator again ''Please be encouraged, the time away from work and off your feet is a GREAT time to really get excellent control of eating and your diabetes  CXR today.  All the best with surgery Please send concerns re blood sugar or  Other via my chanrt Thanks for choosing  [facility], we consider it a privelige to serve you.

## 2017-09-20 NOTE — Assessment & Plan Note (Signed)
Deteriorated but  Still within range Ptto increase glipizide to 12.5 mg daily and continue janumet, she is also to keep in touch with concerns via My chart. Referral back to nutrition educator also Kelly Savage is reminded of the importance of commitment to daily physical activity for 30 minutes or more, as able and the need to limit carbohydrate intake to 30 to 60 grams per meal to help with blood sugar control.   The need to take medication as prescribed, test blood sugar as directed, and to call between visits if there is a concern that blood sugar is uncontrolled is also discussed.   Kelly Savage is reminded of the importance of daily foot exam, annual eye examination, and good blood sugar, blood pressure and cholesterol control.  Diabetic Labs Latest Ref Rng & Units 09/13/2017 04/14/2017 09/09/2016 04/29/2016 04/24/2016  HbA1c <5.7 % of total Hgb 7.6(H) 6.9(H) 6.8(H) - 6.3(H)  Microalbumin mg/dL 0.4 - - 4.4(H) -  Micro/Creat Ratio <30 mcg/mg creat 6 - - 7.6 -  Chol <200 mg/dL 409 811 914 - -  HDL >78 mg/dL 55 59 29(F) - -  Calc LDL mg/dL (calc) 70 67 64 - -  Triglycerides <150 mg/dL 79 621 308 - -  Creatinine 0.50 - 0.99 mg/dL 6.57 8.46 9.62 - 9.52   BP/Weight 09/20/2017 09/17/2017 04/15/2017 09/10/2016 04/29/2016 12/04/2015 07/10/2015  Systolic BP 122 130 130 120 122 100 112  Diastolic BP 82 86 82 76 82 64 78  Wt. (Lbs) 235 236.4 227 237 237 235 236  BMI 35.73 35.94 33.52 35 35 34.7 34.84   Foot/eye exam completion dates Latest Ref Rng & Units 05/25/2017 04/15/2017  Eye Exam No Retinopathy No Retinopathy -  Foot exam Order - - -  Foot Form Completion - - Done

## 2017-09-20 NOTE — Assessment & Plan Note (Signed)
Unchanged. Patient re-educated about  The importance of healthy food choices with portion control discussed. \Exercise is currently limited Encouraged to start a food diary, count calories and to consider  joining a support group. Sample diet sheets offered. Referral to diabetic educator Goals set by the patient for the next several months.   Weight /BMI 09/20/2017 09/17/2017 04/15/2017  WEIGHT 235 lb 236 lb 6.4 oz 227 lb  HEIGHT     BMI 35.73 kg/m2 35.94 kg/m2 33.52 kg/m2

## 2017-09-20 NOTE — Progress Notes (Signed)
*  PRELIMINARY RESULTS* Echocardiogram 2D Echocardiogram has been performed.  Stacey Drain 09/20/2017, 4:05 PM

## 2017-09-20 NOTE — Assessment & Plan Note (Signed)
Pain management by ortho at this time, and definitive management with left knee replacement in next  To 4 weeks

## 2017-09-20 NOTE — Assessment & Plan Note (Addendum)
Has scheduled Left knee replacement for mid June and is here for pre op eval. General medical exam and labs are normal, though blood sugar slightly elevated , is till within range She is only symptomatic of knee pain and stiffness, reports no need for pain medication as she has a remaining few narcotic meds and will be prescribed pain meds by ortho once she has had surgery for the initial period Cardiology eval yielded and abnormal EKG and today she will have an echocardiogram, which,if normal , she will be cleared for a cardiac standpoint also

## 2017-09-20 NOTE — Progress Notes (Signed)
Kelly Savage     MRN: [id number]      DOB: 1956-03-29   HPI Kelly Savage is here for pre opo evaluation and clearance for upcoming left knee replacement surgery, and for follow up and re-evaluation of chronic medical conditions, medication management and review of any available recent lab and radiology data.   Preventive health is updated, specifically  Cancer screening and Immunization.  Needs mamamogram Cardiology eval last week, revealed abnormal EKG , though she is asymptomatic, so she goes for echocardiogram after this visit, and if this is normal , she will be cleared by cardiology The PT denies any adverse reactions to current medications since the last visit.  Noted blood sugar elevation after stopping invokana, and she turned to jardiance instead. I have advised that she increase her glipizide dose to 12.5 mg and will also refer for diabetic re education   ROS Denies recent fever or chills. Denies sinus pressure, nasal congestion, ear pain or sore throat. Denies chest congestion, productive cough or wheezing. Denies chest pains, palpitations and leg swelling Denies abdominal pain, nausea, vomiting,diarrhea or constipation.   Denies dysuria, frequency, hesitancy or incontinence. C/o increased and uncontrolled bilateral knee pain , left worse than right Denies headaches, seizures, numbness, or tingling. Denies depression, anxiety or insomnia. Denies skin break down or rash.   PE  BP 122/82   Pulse 66   Resp 16   Ht  (1.727 m)   Wt 235 lb (106.6 kg)   SpO2 97%   BMI 35.73 kg/m   Patient alert and oriented and in no cardiopulmonary distress.  HEENT: No facial asymmetry, EOMI,   oropharynx pink and moist.  Neck supple no JVD, no mass.  Chest: Clear to auscultation bilaterally.  CVS: S1, S2 no murmurs, no S3.Regular rate.  ABD: Soft non tender.   Ext: No edema  MS: Adequate ROM spine, shoulders, hips and reduced in  Knees.Left worse than right  Skin: Intact,  no ulcerations or rash noted.  Psych: Good eye contact, normal affect. Memory intact not anxious or depressed appearing.  CNS: CN 2-12 intact, power,  normal throughout.no focal deficits noted.   Assessment & Plan  Pre-op evaluation Has scheduled Left knee replacement for mid June and is here for pre op eval. General medical exam and labs are normal, though blood sugar slightly elevated , is till within range She is only symptomatic of knee pain and stiffness, reports no need for pain medication as she has a remaining few narcotic meds and will be prescribed pain meds by ortho once she has had surgery for the initial period Cardiology eval yielded and abnormal EKG and today she will have an echocardiogram, which,if normal , she will be cleared for a cardiac standpoint also  Encounter for pain management No pain medication prescribed by me at this visit, ortho to assume pain mange,ment at this tme  Diabetes mellitus type 2 in obese Deteriorated but  Still within range Ptto increase glipizide to 12.5 mg daily and continue janumet, she is also to keep in touch with concerns via My chart. Referral back to nutrition educator also Kelly Savage is reminded of the importance of commitment to daily physical activity for 30 minutes or more, as able and the need to limit carbohydrate intake to 30 to 60 grams per meal to help with blood sugar control.   The need to take medication as prescribed, test blood sugar as directed, and to call between visits if there is  a concern that blood sugar is uncontrolled is also discussed.   Kelly Savage is reminded of the importance of daily foot exam, annual eye examination, and good blood sugar, blood pressure and cholesterol control.  Diabetic Labs Latest Ref Rng & Units 09/13/2017 04/14/2017 09/09/2016 04/29/2016 04/24/2016  HbA1c <5.7 % of total Hgb 7.6(H) 6.9(H) 6.8(H) - 6.3(H)  Microalbumin mg/dL 0.4 - - 4.4(H) -  Micro/Creat Ratio <30 mcg/mg creat 6 - - 7.6 -    Chol <200 mg/dL 161 096 045 - -  HDL >40 mg/dL 55 59 98(J) - -  Calc LDL mg/dL (calc) 70 67 64 - -  Triglycerides <150 mg/dL 79 191 478 - -  Creatinine 0.50 - 0.99 mg/dL 2.95 6.21 3.08 - 6.57   BP/Weight 09/20/2017 09/17/2017 04/15/2017 09/10/2016 04/29/2016 12/04/2015 07/10/2015  Systolic BP 122 130 130 120 122 100 112  Diastolic BP 82 86 82 76 82 64 78  Wt. (Lbs) 235 236.4 227 237 237 235 236  BMI 35.73 35.94 33.52 35 35 34.7 34.84   Foot/eye exam completion dates Latest Ref Rng & Units 05/25/2017 04/15/2017  Eye Exam No Retinopathy No Retinopathy -  Foot exam Order - - -  Foot Form Completion - - Done        Hyperlipidemia with target LDL less than 100 Controlled, no change in medication DASH diet and commitment to daily physical activity for a minimum of 30 minutes discussed and encouraged, as a part of hypertension management. The importance of attaining a healthy weight is also discussed.  BP/Weight 09/20/2017 09/17/2017 04/15/2017 09/10/2016 04/29/2016 12/04/2015 07/10/2015  Systolic BP 122 130 130 120 122 100 112  Diastolic BP 82 86 82 76 82 64 78  Wt. (Lbs) 235 236.4 227 237 237 235 236  BMI 35.73 35.94 33.52 35 35 34.7 34.84       Morbid obesity (HCC) Unchanged. Patient re-educated about  The importance of healthy food choices with portion control discussed. \Exercise is currently limited Encouraged to start a food diary, count calories and to consider  joining a support group. Sample diet sheets offered. Referral to diabetic educator Goals set by the patient for the next several months.   Weight /BMI 09/20/2017 09/17/2017 04/15/2017  WEIGHT 235 lb 236 lb 6.4 oz 227 lb  HEIGHT     BMI 35.73 kg/m2 35.94 kg/m2 33.52 kg/m2      Bilateral primary osteoarthritis of knee Pain management by ortho at this time, and definitive management with left knee replacement in next  To 4 weeks

## 2017-09-20 NOTE — Assessment & Plan Note (Signed)
No pain medication prescribed by me at this visit, ortho to assume pain mange,ment at this tme

## 2017-09-20 NOTE — Assessment & Plan Note (Signed)
Controlled, no change in medication DASH diet and commitment to daily physical activity for a minimum of 30 minutes discussed and encouraged, as a part of hypertension management. The importance of attaining a healthy weight is also discussed.  BP/Weight 09/20/2017 09/17/2017 04/15/2017 09/10/2016 04/29/2016 12/04/2015 07/10/2015  Systolic BP 122 130 130 120 122 100 112  Diastolic BP 82 86 82 76 82 64 78  Wt. (Lbs) 235 236.4 227 237 237 235 236  BMI 35.73 35.94 33.52 35 35 34.7 34.84

## 2017-09-22 ENCOUNTER — Ambulatory Visit: Payer: 59 | Admitting: Family Medicine

## 2017-09-23 ENCOUNTER — Encounter: Payer: Self-pay | Admitting: Physician Assistant

## 2017-09-23 DIAGNOSIS — M1712 Unilateral primary osteoarthritis, left knee: Secondary | ICD-10-CM

## 2017-09-23 HISTORY — DX: Unilateral primary osteoarthritis, left knee: M17.12

## 2017-09-23 NOTE — H&P (Signed)
TOTAL KNEE ADMISSION H&P  Patient is being admitted for left total knee arthroplasty.  Subjective:  Chief Complaint:left knee pain.  HPI: Kelly Savage, 62 y.o. female, has a history of pain and functional disability in the left knee due to arthritis and has failed non-surgical conservative treatments for greater than 12 weeks to includeNSAID's and/or analgesics, corticosteriod injections, viscosupplementation injections, flexibility and strengthening excercises, supervised PT with diminished ADL's post treatment, weight reduction as appropriate and activity modification.  Onset of symptoms was gradual, starting 10 years ago with gradually worsening course since that time. The patient noted no past surgery on the left knee(s).  Patient currently rates pain in the left knee(s) at 10 out of 10 with activity. Patient has night pain, worsening of pain with activity and weight bearing, pain that interferes with activities of daily living, crepitus and joint swelling.  Patient has evidence of subchondral sclerosis, periarticular osteophytes and joint space narrowing by imaging studies.There is no active infection.  Patient Active Problem List   Diagnosis Date Noted  . Primary localized osteoarthritis of left knee 09/23/2017  . Pre-op evaluation 09/20/2017  . Encounter for pain management 09/11/2016  . Vitamin D deficiency 12/04/2015  . Back spasm 04/18/2014  . Hyperlipidemia with target LDL less than 100 05/28/2011  . Morbid obesity (HCC) 07/25/2010  . Bilateral primary osteoarthritis of knee 07/25/2010  . Diabetes mellitus type 2 in obese (HCC) 03/28/2010  . 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 Ankle pain x3 months   bilateral  . Depression    Due to loss of mother had a fib massive stroke, died after , she was her best friend   . Diabetes mellitus type II 2006  . Obesity all her life    lost 28 lbs in 14 weeks   . Primary localized osteoarthritis of  left knee 09/23/2017  . Right thigh pain   . Thigh pain x 1 year    bilateral, esp when laying down, worse in the morning , and goes away with activity    Past Surgical History:  Procedure Laterality Date  . APPENDECTOMY  2000  . Cholecysectomy  1998  . TONSILLECTOMY     in childhood    No current facility-administered medications for this encounter.    Current Outpatient Medications  Medication Sig Dispense Refill Last Dose  . aspirin EC 81 MG tablet Take 81 mg by mouth daily.   Taking  . benazepril (LOTENSIN) 5 MG tablet Take 1 tablet (5 mg total) daily by mouth. 90 tablet 1 Taking  . Calcium Carbonate-Vit D-Min (CALCIUM 1200 PO) Take 1 tablet by mouth daily.     Taking  . ergocalciferol (VITAMIN D2) 50000 units capsule Take 1 capsule (50,000 Units total) by mouth once a week. One capsule once weekly 12 capsule 1 Taking  . gabapentin (NEURONTIN) 300 MG capsule Take 1 capsule (300 mg total) by mouth at bedtime. 90 capsule 3 Taking  . glipiZIDE (GLUCOTROL XL) 10 MG 24 hr tablet Take 1 tablet (10 mg total) by mouth daily with breakfast. 90 tablet 3 Taking  . glipiZIDE (GLUCOTROL XL) 5 MG 24 hr tablet Take 1 tablet (5 mg total) by mouth daily with breakfast. (Patient taking differently: Take 2.5 mg by mouth daily with breakfast. ) 90 tablet 3 Taking  . glucose blood test strip Freestyle Lite meter. Use as instructed once daily testing dx e11.9 100 each 3 Taking  . HYDROcodone-acetaminophen (VICODIN) 5-325 mg TABS tablet  One tablet once daily as needed, for uncontrolled knee pain. Ninety tablets to last 4 months 90 tablet 0 Taking  . ibuprofen (ADVIL,MOTRIN) 800 MG tablet Take 1 tablet (800 mg total) by mouth every 8 (eight) hours as needed. Sixty tablets to last 4 months 60 tablet 1 Taking  . JANUMET XR 50-1000 MG TB24 TAKE 1 TABLET BY MOUTH 2 TIMES DAILY. 180 tablet PRN Taking  . ONETOUCH DELICA LANCETS MISC Once daily testing 100 each 11 Taking  . pravastatin (PRAVACHOL) 40 MG tablet TAKE  1 TABLET BY MOUTH EVERY EVENING. 90 tablet PRN Taking   No Known Allergies  Social History   Tobacco Use  . Smoking status: Never Smoker  . Smokeless tobacco: Never Used  Substance Use Topics  . Alcohol use: Not on file    Family History  Problem Relation Age of Onset  . Heart failure Mother 13       CVA (secondary to afib   . Hyperlipidemia Father   . Hypertension Father   . Cancer Maternal Grandfather        colon   . Diabetes Maternal Grandmother      Review of Systems  Constitutional: Negative.   HENT: Negative.   Eyes: Negative.   Respiratory: Negative.   Cardiovascular: Negative.   Gastrointestinal: Negative.   Genitourinary: Negative.   Musculoskeletal: Positive for back pain and joint pain.  Skin: Negative.   Neurological: Negative.   Endo/Heme/Allergies: Negative.   Psychiatric/Behavioral: Negative.     Objective:  Physical Exam  Constitutional: She is oriented to person, place, and time. She appears well-developed and well-nourished.  HENT:  Head: Normocephalic and atraumatic.  Mouth/Throat: Oropharynx is clear and moist.  Eyes: Pupils are equal, round, and reactive to light. Conjunctivae are normal.  Neck: Neck supple.  Cardiovascular: Normal rate.  Respiratory: Effort normal.  GI: Soft.  Musculoskeletal:  She is independently ambulatory with a moderately antalgic gait.  She has active range of motion 0-110 degrees bilaterally.  Valgus deformity bilaterally. Medial joint line tenderness 2+ crepitus 2+synovitis  Distal neurovascular exam is intact.    Neurological: She is alert and oriented to person, place, and time.  Skin: Skin is warm and dry.  Psychiatric: She has a normal mood and affect. Her behavior is normal.    Vital signs in last 24 hours:    Labs:   Estimated body mass index is 35.73 kg/m as calculated from the following:   Height as of 09/20/17:  (1.727 m).   Weight as of 09/20/17: 106.6 kg (235 lb).   Imaging Review Plain  radiographs demonstrate severe degenerative joint disease of the left knee(s). The overall alignment issignificant varus. The bone quality appears to be good for age and reported activity level.   Preoperative templating of the joint replacement has been completed, documented, and submitted to the Operating Room personnel in order to optimize intra-operative equipment management.    Patient's anticipated LOS is less than 2 midnights, meeting these requirements: - Younger than 33 - Lives within 1 hour of care - Has a competent adult at home to recover with post-op recover - NO history of  - Chronic pain requiring opiods  - Diabetes  - Coronary Artery Disease  - Heart failure  - Heart attack  - Stroke  - DVT/VTE  - Cardiac arrhythmia  - Respiratory Failure/COPD  - Renal failure  - Anemia  - Advanced Liver disease        Assessment/Plan:  End stage arthritis,  left knee  Principal Problem:   Primary localized osteoarthritis of left knee Active Problems:   Diabetes mellitus type 2 in obese (HCC)   Morbid obesity (HCC)   Bilateral primary osteoarthritis of knee   Hyperlipidemia with target LDL less than 100   Back spasm   Vitamin D deficiency   The patient history, physical examination, clinical judgment of the provider and imaging studies are consistent with end stage degenerative joint disease of the left knee(s) and total knee arthroplasty is deemed medically necessary. The treatment options including medical management, injection therapy arthroscopy and arthroplasty were discussed at length. The risks and benefits of total knee arthroplasty were presented and reviewed. The risks due to aseptic loosening, infection, stiffness, patella tracking problems, thromboembolic complications and other imponderables were discussed. The patient acknowledged the explanation, agreed to proceed with the plan and consent was signed. Patient is being admitted for inpatient treatment for  surgery, pain control, PT, OT, prophylactic antibiotics, VTE prophylaxis, progressive ambulation and ADL's and discharge planning. The patient is planning to be discharged home with home health services

## 2017-09-29 ENCOUNTER — Other Ambulatory Visit: Payer: Self-pay | Admitting: Family Medicine

## 2017-09-29 DIAGNOSIS — M170 Bilateral primary osteoarthritis of knee: Secondary | ICD-10-CM | POA: Diagnosis not present

## 2017-09-29 DIAGNOSIS — Z1231 Encounter for screening mammogram for malignant neoplasm of breast: Secondary | ICD-10-CM

## 2017-09-29 NOTE — H&P (Signed)
TOTAL KNEE ADMISSION H&P  Patient is being admitted for left total knee arthroplasty.  Subjective:  Chief Complaint:left knee pain.  HPI: Kelly Savage, 62 y.o. female, has a history of pain and functional disability in the left knee due to arthritis and has failed non-surgical conservative treatments for greater than 12 weeks to includeNSAID's and/or analgesics, corticosteriod injections, viscosupplementation injections, flexibility and strengthening excercises, supervised PT with diminished ADL's post treatment, use of assistive devices and activity modification.  Onset of symptoms was gradual, starting 10 years ago with gradually worsening course since that time. The patient noted no past surgery on the left knee(s).  Patient currently rates pain in the left knee(s) at 10 out of 10 with activity. Patient has night pain, worsening of pain with activity and weight bearing, pain that interferes with activities of daily living, crepitus and joint swelling.  Patient has evidence of subchondral sclerosis, periarticular osteophytes and joint space narrowing by imaging studies.  There is no active infection.  Patient Active Problem List   Diagnosis Date Noted  . Primary localized osteoarthritis of left knee 09/23/2017  . Pre-op evaluation 09/20/2017  . Encounter for pain management 09/11/2016  . Vitamin D deficiency 12/04/2015  . Back spasm 04/18/2014  . Hyperlipidemia with target LDL less than 100 05/28/2011  . Morbid obesity (HCC) 07/25/2010  . Bilateral primary osteoarthritis of knee 07/25/2010  . Diabetes mellitus type 2 in obese (HCC) 03/28/2010  . 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 Ankle pain x3 months   bilateral  . Depression    Due to loss of mother had a fib massive stroke, died after , she was her best friend   . Diabetes mellitus type II 2006  . Obesity all her life    lost 28 lbs in 14 weeks   . Primary localized osteoarthritis of left  knee 09/23/2017  . Right thigh pain   . Thigh pain x 1 year    bilateral, esp when laying down, worse in the morning , and goes away with activity    Past Surgical History:  Procedure Laterality Date  . APPENDECTOMY  2000  . Cholecysectomy  1998  . TONSILLECTOMY     in childhood    No current facility-administered medications for this encounter.    Current Outpatient Medications  Medication Sig Dispense Refill Last Dose  . aspirin EC 81 MG tablet Take 81 mg by mouth daily.   09/29/2017 at Unknown time  . benazepril (LOTENSIN) 5 MG tablet Take 1 tablet (5 mg total) daily by mouth. 90 tablet 1 09/29/2017 at Unknown time  . Calcium Carbonate-Vit D-Min (CALCIUM 1200 PO) Take 1 tablet by mouth daily.     09/29/2017 at Unknown time  . ergocalciferol (VITAMIN D2) 50000 units capsule Take 1 capsule (50,000 Units total) by mouth once a week. One capsule once weekly 12 capsule 1 09/28/2017 at Unknown time  . gabapentin (NEURONTIN) 300 MG capsule Take 1 capsule (300 mg total) by mouth at bedtime. 90 capsule 3 09/28/2017 at Unknown time  . glipiZIDE (GLUCOTROL XL) 10 MG 24 hr tablet Take 1 tablet (10 mg total) by mouth daily with breakfast. 90 tablet 3 09/29/2017 at Unknown time  . glipiZIDE (GLUCOTROL XL) 5 MG 24 hr tablet Take 1 tablet (5 mg total) by mouth daily with breakfast. (Patient taking differently: Take 2.5 mg by mouth daily with breakfast. ) 90 tablet 3 09/29/2017 at Unknown time  . JANUMET XR 50-1000 MG  TB24 TAKE 1 TABLET BY MOUTH 2 TIMES DAILY. 180 tablet PRN 09/28/2017 at Unknown time  . pravastatin (PRAVACHOL) 40 MG tablet TAKE 1 TABLET BY MOUTH EVERY EVENING. 90 tablet PRN 09/29/2017 at Unknown time  . glucose blood test strip Freestyle Lite meter. Use as instructed once daily testing dx e11.9 100 each 3 Taking  . HYDROcodone-acetaminophen (VICODIN) 5-325 mg TABS tablet One tablet once daily as needed, for uncontrolled knee pain. Ninety tablets to last 4 months 90 tablet 0 More than a month at  Unknown time  . ONETOUCH DELICA LANCETS MISC Once daily testing 100 each 11 Taking   No Known Allergies  Social History   Tobacco Use  . Smoking status: Never Smoker  . Smokeless tobacco: Never Used  Substance Use Topics  . Alcohol use: Not on file    Family History  Problem Relation Age of Onset  . Heart failure Mother 21       CVA (secondary to afib   . Hyperlipidemia Father   . Hypertension Father   . Cancer Maternal Grandfather        colon   . Diabetes Maternal Grandmother      Review of Systems  Constitutional: Negative.   HENT: Negative.   Eyes: Negative.   Respiratory: Negative.   Cardiovascular: Negative.   Gastrointestinal: Negative.   Genitourinary: Negative.   Musculoskeletal: Positive for joint pain.  Skin: Negative.   Neurological: Negative.   Endo/Heme/Allergies: Negative.   Psychiatric/Behavioral: Negative.     Objective:  Physical Exam  Constitutional: She is oriented to person, place, and time. She appears well-developed and well-nourished.  HENT:  Head: Normocephalic and atraumatic.  Mouth/Throat: Oropharynx is clear and moist.  Eyes: Pupils are equal, round, and reactive to light. Conjunctivae are normal.  Neck: Neck supple.  Cardiovascular: Normal rate and regular rhythm.  Respiratory: Effort normal and breath sounds normal.  GI: Soft. Bowel sounds are normal.  Genitourinary:  Genitourinary Comments: Not pertinent to current symptomatology therefore not examined.  Musculoskeletal:  She is independently ambulatory with a moderately antalgic gait.  She has active range of motion 0-110 degrees bilaterally.  Valgus deformity bilaterally.  Distal neurovascular exam is intact.    Neurological: She is alert and oriented to person, place, and time.  Skin: Skin is warm and dry.  Psychiatric: She has a normal mood and affect. Her behavior is normal.    Vital signs in last 24 hours:    Labs:   Estimated body mass index is 35.73 kg/m as  calculated from the following:   Height as of 09/20/17:  (1.727 m).   Weight as of 09/20/17: 106.6 kg (235 lb).   Imaging Review Plain radiographs demonstrate severe degenerative joint disease of the left knee(s). The overall alignment ismild valgus. The bone quality appears to be good for age and reported activity level.   Preoperative templating of the joint replacement has been completed, documented, and submitted to the Operating Room personnel in order to optimize intra-operative equipment management.   Anticipated LOS equal to or greater than 2 midnights due to - Age 52 and older with one or more of the following:  - Obesity  - Expected need for hospital services (PT, OT, Nursing) required for safe  discharge  - Anticipated need for postoperative skilled nursing care or inpatient rehab  - Active co-morbidities: Chronic pain requiring opiods, Diabetes and Cardiac Arrhythmia OR        Assessment/Plan:  End stage arthritis, left knee  Principal Problem:   Primary localized osteoarthritis of left knee Active Problems:   Diabetes mellitus type 2 in obese (HCC)   Morbid obesity (HCC)   Bilateral primary osteoarthritis of knee   Hyperlipidemia with target LDL less than 100   Back spasm   Vitamin D deficiency   The patient history, physical examination, clinical judgment of the provider and imaging studies are consistent with end stage degenerative joint disease of the left knee(s) and total knee arthroplasty is deemed medically necessary. The treatment options including medical management, injection therapy arthroscopy and arthroplasty were discussed at length. The risks and benefits of total knee arthroplasty were presented and reviewed. The risks due to aseptic loosening, infection, stiffness, patella tracking problems, thromboembolic complications and other imponderables were discussed. The patient acknowledged the explanation, agreed to proceed with the plan and consent  was signed. Patient is being admitted for inpatient treatment for surgery, pain control, PT, OT, prophylactic antibiotics, VTE prophylaxis, progressive ambulation and ADL's and discharge planning. The patient is planning to be discharged home with home health services

## 2017-10-06 NOTE — Pre-Procedure Instructions (Signed)
Kelly Savage  10/06/2017      [facility] [place] [address] [place] [address] Phone: [phone] Fax: [phone]    Your procedure is scheduled on Mon., October 18, 2017 from 7:15AM-9:27AM  [facility] Ad[facility] " A" at 5:30AM  Call this number if you have problems the morning of surgery:  [phone]   Remember:  No food or drinks after midnight on June 16th    Take these medicines the morning of surgery with A SIP OF WATER: If needed HYDROcodone-acetaminophen (VICODIN)   Follow your doctors instructions regarding your Aspirin.  If no instructions were given by your doctor, then you will need to call the prescribing office office to get instructions.    7 days before surgery (6/10), stop taking all Other Aspirin Products, Vitamins, Fish oils, and Herbal medications. Also stop all NSAIDS i.e. Advil, Ibuprofen, Motrin, Aleve, Anaprox, Naproxen, BC, Goody Powders, and all Supplements. Including: Diclofenac (VOLTAREN)  How to Manage Your Diabetes Before and After Surgery  Why is it important to control my blood sugar before and after surgery? . Improving blood sugar levels before and after surgery helps healing and can limit problems. . A way of improving blood sugar control is eating a healthy diet by: o  Eating less sugar and carbohydrates o  Increasing activity/exercise o  Talking with your doctor about reaching your blood sugar goals . High blood sugars (greater than 180 mg/dL) can raise your risk of infections and slow your recovery, so you will need to focus on controlling your diabetes during the weeks before surgery. . Make sure that the doctor who takes care of your diabetes knows about your planned surgery including the date and location.  How do I manage my blood sugar before surgery? . Check your blood sugar at least 4 times a day, starting 2 days before surgery, to  make sure that the level is not too high or low. o Check your blood sugar the morning of your surgery when you wake up and every 2 hours until you get to the Short Stay unit. . If your blood sugar is less than 70 mg/dL, you will need to treat for low blood sugar: o Do not take insulin. o Treat a low blood sugar (less than 70 mg/dL) with  cup of clear juice (cranberry or apple), 4 glucose tablets, OR glucose gel. Recheck blood sugar in 15 minutes after treatment (to make sure it is greater than 70 mg/dL). If your blood sugar is not greater than 70 mg/dL on recheck, call [phone] o  for further instructions. . Report your blood sugar to the short stay nurse when you get to Short Stay.  . If you are admitted to the hospital after surgery: o Your blood sugar will be checked by the staff and you will probably be given insulin after surgery (instead of oral diabetes medicines) to make sure you have good blood sugar levels. o The goal for blood sugar control after surgery is 80-180 mg/dL.  WHAT DO I DO ABOUT MY DIABETES MEDICATION?  Marland Kitchen. Do not take GlipiZIDE (GLUCOTROL XL) and JANUMET XR the morning of surgery.  . If your CBG is greater than 220 mg/dL, inform the staff upon arrival.  Reviewed and Endorsed by [facility] Patient Education Committee, August 2015    Do not wear jewelry, make-up or nail polish (finger).  Do not wear lotions,  powders, or perfumes, or deodorant.  Do not shave 48 hours prior to surgery.    Do not bring valuables to the hospital.  [facility] is not responsible for any belongings or valuables.  Contacts, dentures or bridgework may not be worn into surgery.  Leave your suitcase in the car.  After surgery it may be brought to your room.  For patients admitted to the hospital, discharge time will be determined by your treatment team.  Patients discharged the day of surgery will not be allowed to drive home.   Special instructions:   [place]- Preparing For  Surgery  Before surgery, you can play an important role. Because skin is not sterile, your skin needs to be as free of germs as possible. You can reduce the number of germs on your skin by washing with CHG (chlorahexidine gluconate) Soap before surgery.  CHG is an antiseptic cleaner which kills germs and bonds with the skin to continue killing germs even after washing.    Oral Hygiene is also important to reduce your risk of infection.  Remember - BRUSH YOUR TEETH THE MORNING OF SURGERY WITH YOUR REGULAR TOOTHPASTE  Please do not use if you have an allergy to CHG or antibacterial soaps. If your skin becomes reddened/irritated stop using the CHG.  Do not shave (including legs and underarms) for at least 48 hours prior to first CHG shower. It is OK to shave your face.  Please follow these instructions carefully.   1. Shower the NIGHT BEFORE SURGERY and the MORNING OF SURGERY with CHG.   2. If you chose to wash your hair, wash your hair first as usual with your normal shampoo.  3. After you shampoo, rinse your hair and body thoroughly to remove the shampoo.  4. Use CHG as you would any other liquid soap. You can apply CHG directly to the skin and wash gently with a scrungie or a clean washcloth.   5. Apply the CHG Soap to your body ONLY FROM THE NECK DOWN.  Do not use on open wounds or open sores. Avoid contact with your eyes, ears, mouth and genitals (private parts). Wash Face and genitals (private parts)  with your normal soap.  6. Wash thoroughly, paying special attention to the area where your surgery will be performed.  7. Thoroughly rinse your body with warm water from the neck down.  8. DO NOT shower/wash with your normal soap after using and rinsing off the CHG Soap.  9. Pat yourself dry with a CLEAN TOWEL.  10. Wear CLEAN PAJAMAS to bed the night before surgery, wear comfortable clothes the morning of surgery  11. Place CLEAN SHEETS on your bed the night of your first shower and  DO NOT SLEEP WITH PETS.  Day of Surgery:  Do not apply any deodorants/lotions.  Please wear clean clothes to the hospital/surgery center.   Remember to brush your teeth WITH YOUR REGULAR TOOTHPASTE.  Please read over the following fact sheets that you were given. Pain Booklet, Coughing and Deep Breathing, MRSA Information and Surgical Site Infection Prevention

## 2017-10-07 ENCOUNTER — Encounter (HOSPITAL_COMMUNITY)
Admission: RE | Admit: 2017-10-07 | Discharge: 2017-10-07 | Disposition: A | Discharge status: Home / Self Care | Payer: 59 | Source: Ambulatory Visit | Attending: Orthopedic Surgery | Admitting: Orthopedic Surgery

## 2017-10-07 ENCOUNTER — Encounter (HOSPITAL_COMMUNITY): Payer: Self-pay

## 2017-10-07 ENCOUNTER — Other Ambulatory Visit: Payer: Self-pay

## 2017-10-07 DIAGNOSIS — Z01812 Encounter for preprocedural laboratory examination: Secondary | ICD-10-CM | POA: Insufficient documentation

## 2017-10-07 HISTORY — DX: Unilateral primary osteoarthritis, unspecified knee: M17.10

## 2017-10-07 LAB — SURGICAL PCR SCREEN
MRSA, PCR: NEGATIVE
STAPHYLOCOCCUS AUREUS: NEGATIVE

## 2017-10-07 LAB — CBC WITH DIFFERENTIAL/PLATELET
ABS IMMATURE GRANULOCYTES: 0 10*3/uL (ref 0.0–0.1)
Basophils Absolute: 0.1 10*3/uL (ref 0.0–0.1)
Basophils Relative: 1 %
EOS ABS: 0.1 10*3/uL (ref 0.0–0.7)
Eosinophils Relative: 2 %
HEMATOCRIT: 43.1 % (ref 36.0–46.0)
HEMOGLOBIN: 14.1 g/dL (ref 12.0–15.0)
IMMATURE GRANULOCYTES: 0 %
LYMPHS ABS: 1.8 10*3/uL (ref 0.7–4.0)
LYMPHS PCT: 26 %
MCH: 29.3 pg (ref 26.0–34.0)
MCHC: 32.7 g/dL (ref 30.0–36.0)
MCV: 89.6 fL (ref 78.0–100.0)
MONOS PCT: 6 %
Monocytes Absolute: 0.5 10*3/uL (ref 0.1–1.0)
NEUTROS ABS: 4.7 10*3/uL (ref 1.7–7.7)
NEUTROS PCT: 65 %
Platelets: 225 10*3/uL (ref 150–400)
RBC: 4.81 MIL/uL (ref 3.87–5.11)
RDW: 12.7 % (ref 11.5–15.5)
WBC: 7.2 10*3/uL (ref 4.0–10.5)

## 2017-10-07 LAB — APTT: aPTT: 26 seconds (ref 24–36)

## 2017-10-07 LAB — GLUCOSE, CAPILLARY: Glucose-Capillary: 180 mg/dL — ABNORMAL HIGH (ref 65–99)

## 2017-10-07 LAB — COMPREHENSIVE METABOLIC PANEL
ALBUMIN: 4 g/dL (ref 3.5–5.0)
ALT: 27 U/L (ref 14–54)
AST: 22 U/L (ref 15–41)
Alkaline Phosphatase: 53 U/L (ref 38–126)
Anion gap: 7 (ref 5–15)
BILIRUBIN TOTAL: 1.1 mg/dL (ref 0.3–1.2)
BUN: 18 mg/dL (ref 6–20)
CO2: 27 mmol/L (ref 22–32)
CREATININE: 0.8 mg/dL (ref 0.44–1.00)
Calcium: 9.2 mg/dL (ref 8.9–10.3)
Chloride: 106 mmol/L (ref 101–111)
GFR calc Af Amer: >60 mL/min (ref >60)
Glucose, Bld: 158 mg/dL — ABNORMAL HIGH (ref 65–99)
Potassium: 4 mmol/L (ref 3.5–5.1)
Sodium: 140 mmol/L (ref 135–145)
TOTAL PROTEIN: 6.9 g/dL (ref 6.5–8.1)

## 2017-10-07 LAB — PROTIME-INR
INR: 0.98
PROTHROMBIN TIME: 12.8 s (ref 11.4–15.2)

## 2017-10-07 NOTE — Progress Notes (Signed)
PCP - Dr. Syliva OvermanMargaret Savage  Cardiologist - Dr. Prentice DockerSuresh Savage  Chest x-ray - 09/21/17 (E)  EKG - 09/17/17 (E)  Stress Test - Denies  ECHO - 09/20/17 (E)  Cardiac Cath - Denies  Sleep Study - Denies CPAP - None  LABS- 10/07/17: CBC w/D, CMP, PT, PTT, UC  ASA- LD- 6/1  HA1C- 09/13/17: 7.6 (E) Fasting Blood Sugar - 120-140, Today 180 Checks Blood Sugar _4-5____ times a week  Anesthesia- Yes- EKG  Pt denies having chest pain, sob, or fever at this time. All instructions explained to the pt, with a verbal understanding of the material. Pt agrees to go over the instructions while at home for a better understanding. The opportunity to ask questions was provided.

## 2017-10-08 LAB — URINE CULTURE

## 2017-10-08 NOTE — Progress Notes (Signed)
Anesthesia Chart Review:  Case:  [id number] Date/Time:  10/18/17 0700   Procedure:  TOTAL KNEE ARTHROPLASTY (Left )   Anesthesia type:  Spinal   Pre-op diagnosis:  OA LEFT KNEE   Location:  [facility] ROOM 03 / [facility] OR   Surgeon:  Salvatore Marvel, MD      DISCUSSION: Patient is a 62 year old female scheduled for the above procedure. History includes never smoker, DM2. 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  She had recent PCP and cardiology preoperative assessments. No additional testing recommended following echocardiogram.  Last ASA 10/02/17.  I would anticipate that she can proceed as planned if no acute changes.    VS: BP 124/61   Pulse 92   Temp 36.7 C   Resp 18   Ht 5\' 9"  (1.753 m)   Wt 236 lb 6.4 oz (107.2 kg)   SpO2 97%   BMI 34.91 kg/m   PROVIDERS: Kerri Perches, MD is PCP. Last visit 09/20/17. For preoperative evaluation.  Beecher Mcardle, MD is cardiologist. Last visit 09/17/17 for preoperative risk assessment. He wrote, "Preoperative risk stratification with abnormal ECG (right bundle branch block and left anterior fascicular block): She is symptomatically stable.  She is scheduled for left knee replacement surgery on 10/18/2017.   She is able to climb a flight of stairs without exertional symptoms thus being able to achieve 4 METS.  I do not feel she warrants stress testing. I will order a 2-D echocardiogram with Doppler to evaluate cardiac structure, function, and regional wall motion."  Based on echo results, he did not recommend additional cardiac testing.   LABS: Labs reviewed: Acceptable for surgery. A1c 7.6 on 09/13/17.  Defer decision for repeat urine culture to surgeon. (all labs ordered are listed, but only abnormal results are displayed)  Labs Reviewed  URINE CULTURE - Abnormal; Notable for the following components:      Result Value   Culture MULTIPLE SPECIES PRESENT, SUGGEST RECOLLECTION (*)    All other components within normal limits  GLUCOSE,  CAPILLARY - Abnormal; Notable for the following components:   Glucose-Capillary 180 (*)    All other components within normal limits  COMPREHENSIVE METABOLIC PANEL - Abnormal; Notable for the following components:   Glucose, Bld 158 (*)    All other components within normal limits  SURGICAL PCR SCREEN  APTT  CBC WITH DIFFERENTIAL/PLATELET  PROTIME-INR    IMAGES: CXR 09/20/17: IMPRESSION: Mild chronic aortic tortuosity, stable over the past 8 years. Otherwise unremarkable radiographs of the chest.   EKG: 09/17/17 EKG: Sinus rhythm.  Right bundle branch block, left axis-bifascicular block.  Old anterolateral infarct.   CV: Echo 09/20/17: Study Conclusions - Left ventricle: The cavity size was normal. Wall thickness was   increased in a pattern of mild LVH. Systolic function was normal.   The estimated ejection fraction was in the range of 60% to 65%.   Wall motion was normal; there were no regional wall motion   abnormalities. Doppler parameters are consistent with abnormal   left ventricular relaxation (grade 1 diastolic dysfunction).  Past Medical History:  Diagnosis Date  . Ankle pain x3 months   bilateral  . Depression    Due to loss of mother had a fib massive stroke, died after , she was her best friend   . Diabetes mellitus type II 2006  . Obesity all her life    lost 28 lbs in 14 weeks   . Primary localized osteoarthritis of  knee    Left  . Primary localized osteoarthritis of left knee 09/23/2017  . Right thigh pain   . Thigh pain x 1 year    bilateral, esp when laying down, worse in the morning , and goes away with activity    Past Surgical History:  Procedure Laterality Date  . APPENDECTOMY  2000  . Cholecysectomy  1998  . CHOLECYSTECTOMY    . TONSILLECTOMY     in childhood    MEDICATIONS: . aspirin EC 81 MG tablet  . benazepril (LOTENSIN) 10 MG tablet  . calcium carbonate (CALCIUM 600) 1500 (600 Ca) MG TABS tablet  . diclofenac (VOLTAREN) 75 MG EC  tablet  . ergocalciferol (VITAMIN D2) 50000 units capsule  . gabapentin (NEURONTIN) 300 MG capsule  . glipiZIDE (GLUCOTROL XL) 10 MG 24 hr tablet  . glipiZIDE (GLUCOTROL XL) 5 MG 24 hr tablet  . glucose blood test strip  . HYDROcodone-acetaminophen (VICODIN) 5-325 mg TABS tablet  . JANUMET XR 50-1000 MG TB24  . ONETOUCH DELICA LANCETS MISC  . pravastatin (PRAVACHOL) 40 MG tablet   No current facility-administered medications for this encounter.     Velna Ochsllison Zelenak, PA-C [facility]Anesthesiology Phone [phone] 10/08/2017 10:45 AM

## 2017-10-11 ENCOUNTER — Other Ambulatory Visit: Payer: Self-pay | Admitting: *Deleted

## 2017-10-11 NOTE — Patient Outreach (Signed)
[facility]) Care Management  10/11/2017  Kelly Savage 11/10/1955 [id number]   Subjective: Telephone call to patient's home  number, no answer, left HIPAA compliant voicemail message, and requested call back.     Objective: Per KPN (Knowledge Performance Now, point of care tool) and chart review, patient to be admitted on 10/18/17 for Left TOTAL KNEE ARTHROPLASTY.   Patient also has a history of diabetes and hyperlipidemia.     Assessment: Received UMR Preoperative / Transition of care referral on 10/07/17.   Preoperative call follow up pending patient contact.       Plan: RNCM will call patient for 2nd telephone outreach attempt, preoperative call follow up, within 10 business days if no return call.      Shadrach Bartunek H. Gardiner Barefootooper RN, BSN, CCM [facility] Care Management [facility] Telephonic CM Phone: [phone] Fax: [phone]

## 2017-10-12 ENCOUNTER — Telehealth: Payer: Self-pay

## 2017-10-12 DIAGNOSIS — E669 Obesity, unspecified: Principal | ICD-10-CM

## 2017-10-12 DIAGNOSIS — E1169 Type 2 diabetes mellitus with other specified complication: Secondary | ICD-10-CM

## 2017-10-12 NOTE — Telephone Encounter (Signed)
-----  Message from Fayrene Helper, MD sent at 09/20/2017 10:04 PM EDT ----- Regarding: pls refer to diabetic educator,  Also will need lab ordered for next visit , non fast HBA1C , chem 7 and EGFR

## 2017-10-13 ENCOUNTER — Other Ambulatory Visit: Payer: Self-pay | Admitting: *Deleted

## 2017-10-13 NOTE — Patient Outreach (Signed)
[facility]) Care Management  10/13/2017  Kelly Savage 05/26/1955 [id number]  Subjective: Telephone call to patient's home number, spoke with Mr. Kelly Savage, states Kelly Savage is currently at work, left HIPAA compliant  message, and requested call back.    Objective: Per KPN (Knowledge Performance Now, point of care tool) and chart review, patient to be admitted on 10/18/17 for Left TOTAL KNEE ARTHROPLASTY.   Patient also has a history of diabetes and hyperlipidemia.     Assessment: Received UMR Preoperative / Transition of care referral on 10/07/17.   Preoperative call follow up pending patient contact.       Plan: RNCM will call patient for 3rd telephone outreach attempt, preoperative call follow up, within 10 business days if no return call.      Donnavin Vandenbrink H. Gardiner Barefootooper RN, BSN, CCM [facility] Care Management [facility] Telephonic CM Phone: [phone] Fax: [phone]

## 2017-10-14 ENCOUNTER — Other Ambulatory Visit: Payer: Self-pay | Admitting: *Deleted

## 2017-10-14 NOTE — Patient Outreach (Signed)
[facility]) Care Management  10/14/2017  [name] 08/01/1955 [id number]   Subjective: Telephone call to patient's mobile number, spoke with patient, and HIPAA verified.   Discussed [facility] UMR Transition of care follow up, preoperative call follow up, patient voiced understanding, and is in agreement to both types of follow up.   Patient states she is doing okay, [facility], estimated length of stay 1 night, and will call to schedule follow up with primary post hospital discharge.   Patient states she is able to manage self care and has assistance as needed with activities of daily living / home management post discharge. Patient voices understanding of medical diagnosis, pending surgery, and treatment plan. States she is accessing the following [facility] benefits: outpatient pharmacy, hospital indemnity (verbally given contact number for [employer] [phone], will file claim if appropriate, verbally given contact number for [facility] [phone] to request itemized bill), and family medical leave act (FMLA) in place.  Discussed Advanced Directives, advised of [facility] [employer] Advanced Directives document completion benefit, patient voices understanding, verbally given contact number [phone], and states she will call to make appointment.  States she will also follow up with [facility]  [phone]) as needed to verify benefits while on leave.  Patient states she does not have any preoperative questions, care coordination, disease management, disease monitoring, transportation, community resource, or pharmacy needs at this time.  States he is very appreciative of the follow up and is in agreement to receive [facility] Care Management information post transition of care follow up.     Objective:Per KPN (Knowledge Performance Now, point of care tool) and chart review,patient to be  admitted on 10/18/17 for LeftTOTAL KNEE ARTHROPLASTY. Patient also has a history of diabetes and hyperlipidemia.     Assessment: Received UMR Preoperative / Transition of care referral on 10/07/17.Preoperative call completed, and transition of care follow up pending notification of patient discharge.     Plan:RNCM will call patient for  telephone outreach attempt, transition of care follow up, within 3 business days of hospital discharge notification.     Maribelle Hopple H. Gardiner Barefootooper RN, BSN, CCM [facility] Care Management [facility] Telephonic CM Phone: [phone] Fax: [phone]

## 2017-10-15 MED ORDER — TRANEXAMIC ACID 1000 MG/10ML IV SOLN
1000.0000 mg | INTRAVENOUS | Status: AC
  Administered 2017-10-18: 1000 mg via INTRAVENOUS
  Filled 2017-10-15: qty 10

## 2017-10-15 MED ORDER — BUPIVACAINE LIPOSOME 1.3 % IJ SUSP
20.0000 mL | Freq: Once | INTRAMUSCULAR | Status: DC
  Filled 2017-10-15: qty 20

## 2017-10-15 MED ORDER — DEXTROSE 5 % IV SOLN
3.0000 g | INTRAVENOUS | Status: AC
  Administered 2017-10-18: 3 g via INTRAVENOUS
  Filled 2017-10-15: qty 3

## 2017-10-17 ENCOUNTER — Encounter (HOSPITAL_COMMUNITY): Payer: Self-pay | Admitting: Anesthesiology

## 2017-10-17 NOTE — Anesthesia Preprocedure Evaluation (Addendum)
Anesthesia Evaluation  Patient identified by MRN, date of birth, ID band Patient awake    Reviewed: Allergy & Precautions, NPO status , Patient's Chart, lab work & pertinent test results  Airway Mallampati: I  TM Distance: >3 FB Neck ROM: Full    Dental  (+) Dental Advisory Given, Missing,    Pulmonary    Pulmonary exam normal breath sounds clear to auscultation       Cardiovascular hypertension, Pt. on medications Normal cardiovascular exam Rhythm:Regular Rate:Normal     Neuro/Psych Depression    GI/Hepatic   Endo/Other  diabetes, Type 2, Oral Hypoglycemic Agents  Renal/GU      Musculoskeletal  (+) Arthritis , Osteoarthritis,    Abdominal (+) + obese,   Peds  Hematology   Anesthesia Other Findings   Reproductive/Obstetrics                          Anesthesia Physical Anesthesia Plan  ASA: II  Anesthesia Plan: Spinal   Post-op Pain Management:  Regional for Post-op pain   Induction:   PONV Risk Score and Plan: 2 and Ondansetron and Dexamethasone  Airway Management Planned: Natural Airway and Simple Face Mask  Additional Equipment:   Intra-op Plan:   Post-operative Plan:   Informed Consent: I have reviewed the patients History and Physical, chart, labs and discussed the procedure including the risks, benefits and alternatives for the proposed anesthesia with the patient or authorized representative who has indicated his/her understanding and acceptance.   Dental advisory given  Plan Discussed with: CRNA and Surgeon  Anesthesia Plan Comments:        Anesthesia Quick Evaluation

## 2017-10-18 ENCOUNTER — Observation Stay (HOSPITAL_COMMUNITY)
Admission: RE | Admit: 2017-10-18 | Discharge: 2017-10-19 | Disposition: A | Discharge status: Home / Self Care | Payer: 59 | Source: Ambulatory Visit | Attending: Orthopedic Surgery | Admitting: Orthopedic Surgery

## 2017-10-18 ENCOUNTER — Other Ambulatory Visit: Payer: Self-pay

## 2017-10-18 ENCOUNTER — Encounter (HOSPITAL_COMMUNITY): Payer: Self-pay | Admitting: *Deleted

## 2017-10-18 ENCOUNTER — Inpatient Hospital Stay (HOSPITAL_COMMUNITY): Payer: 59 | Admitting: Vascular Surgery

## 2017-10-18 ENCOUNTER — Inpatient Hospital Stay (HOSPITAL_COMMUNITY): Payer: 59 | Admitting: Anesthesiology

## 2017-10-18 ENCOUNTER — Encounter (HOSPITAL_COMMUNITY)
Admission: RE | Disposition: A | Discharge status: Home / Self Care | Payer: Self-pay | Source: Ambulatory Visit | Attending: Orthopedic Surgery

## 2017-10-18 DIAGNOSIS — E1159 Type 2 diabetes mellitus with other circulatory complications: Secondary | ICD-10-CM | POA: Diagnosis present

## 2017-10-18 DIAGNOSIS — M1712 Unilateral primary osteoarthritis, left knee: Principal | ICD-10-CM | POA: Insufficient documentation

## 2017-10-18 DIAGNOSIS — Z79899 Other long term (current) drug therapy: Secondary | ICD-10-CM | POA: Insufficient documentation

## 2017-10-18 DIAGNOSIS — M62830 Muscle spasm of back: Secondary | ICD-10-CM | POA: Diagnosis present

## 2017-10-18 DIAGNOSIS — Z6835 Body mass index (BMI) 35.0-35.9, adult: Secondary | ICD-10-CM | POA: Insufficient documentation

## 2017-10-18 DIAGNOSIS — E669 Obesity, unspecified: Secondary | ICD-10-CM | POA: Diagnosis present

## 2017-10-18 DIAGNOSIS — E119 Type 2 diabetes mellitus without complications: Secondary | ICD-10-CM | POA: Insufficient documentation

## 2017-10-18 DIAGNOSIS — Z7984 Long term (current) use of oral hypoglycemic drugs: Secondary | ICD-10-CM | POA: Insufficient documentation

## 2017-10-18 DIAGNOSIS — I10 Essential (primary) hypertension: Secondary | ICD-10-CM | POA: Diagnosis not present

## 2017-10-18 DIAGNOSIS — M170 Bilateral primary osteoarthritis of knee: Secondary | ICD-10-CM | POA: Diagnosis present

## 2017-10-18 DIAGNOSIS — E785 Hyperlipidemia, unspecified: Secondary | ICD-10-CM | POA: Insufficient documentation

## 2017-10-18 DIAGNOSIS — E66811 Obesity, class 1: Secondary | ICD-10-CM | POA: Diagnosis present

## 2017-10-18 DIAGNOSIS — E559 Vitamin D deficiency, unspecified: Secondary | ICD-10-CM | POA: Insufficient documentation

## 2017-10-18 DIAGNOSIS — R2689 Other abnormalities of gait and mobility: Secondary | ICD-10-CM | POA: Insufficient documentation

## 2017-10-18 DIAGNOSIS — G8929 Other chronic pain: Secondary | ICD-10-CM | POA: Insufficient documentation

## 2017-10-18 DIAGNOSIS — G8918 Other acute postprocedural pain: Secondary | ICD-10-CM | POA: Diagnosis not present

## 2017-10-18 DIAGNOSIS — Z7982 Long term (current) use of aspirin: Secondary | ICD-10-CM | POA: Diagnosis not present

## 2017-10-18 DIAGNOSIS — E1169 Type 2 diabetes mellitus with other specified complication: Secondary | ICD-10-CM | POA: Diagnosis not present

## 2017-10-18 HISTORY — PX: TOTAL KNEE ARTHROPLASTY: SHX125

## 2017-10-18 HISTORY — DX: Unilateral primary osteoarthritis, left knee: M17.12

## 2017-10-18 LAB — GLUCOSE, CAPILLARY
GLUCOSE-CAPILLARY: 165 mg/dL — AB (ref 65–99)
GLUCOSE-CAPILLARY: 158 mg/dL — AB (ref 65–99)
GLUCOSE-CAPILLARY: 191 mg/dL — AB (ref 65–99)
Glucose-Capillary: 169 mg/dL — ABNORMAL HIGH (ref 65–99)

## 2017-10-18 LAB — HEMOGLOBIN A1C
HEMOGLOBIN A1C: 8 % — AB (ref 4.8–5.6)
Mean Plasma Glucose: 182.9 mg/dL

## 2017-10-18 SURGERY — ARTHROPLASTY, KNEE, TOTAL
Anesthesia: Spinal | Site: Knee | Laterality: Left

## 2017-10-18 MED ORDER — ONDANSETRON HCL 4 MG/2ML IJ SOLN
INTRAMUSCULAR | Status: AC
  Filled 2017-10-18: qty 2

## 2017-10-18 MED ORDER — PROPOFOL 500 MG/50ML IV EMUL
INTRAVENOUS | Status: DC | PRN
  Administered 2017-10-18: 100 ug/kg/min via INTRAVENOUS
  Administered 2017-10-18: 09:00:00 via INTRAVENOUS

## 2017-10-18 MED ORDER — OXYCODONE HCL 5 MG PO TABS
ORAL_TABLET | ORAL | Status: AC
  Filled 2017-10-18: qty 2

## 2017-10-18 MED ORDER — 0.9 % SODIUM CHLORIDE (POUR BTL) OPTIME
TOPICAL | Status: DC | PRN
  Administered 2017-10-18: 1000 mL

## 2017-10-18 MED ORDER — PROPOFOL 1000 MG/100ML IV EMUL
INTRAVENOUS | Status: AC
  Filled 2017-10-18: qty 100

## 2017-10-18 MED ORDER — ACETAMINOPHEN 500 MG PO TABS
1000.0000 mg | ORAL_TABLET | Freq: Four times a day (QID) | ORAL | Status: DC
  Administered 2017-10-18 – 2017-10-19 (×3): 1000 mg via ORAL
  Filled 2017-10-18 (×3): qty 2

## 2017-10-18 MED ORDER — METOCLOPRAMIDE HCL 5 MG PO TABS: 5.0000 mg | ORAL_TABLET | Freq: Three times a day (TID) | ORAL | Status: DC | PRN

## 2017-10-18 MED ORDER — ROPIVACAINE HCL 7.5 MG/ML IJ SOLN
INTRAMUSCULAR | Status: DC | PRN
  Administered 2017-10-18 (×4): 5 mL via PERINEURAL

## 2017-10-18 MED ORDER — DEXAMETHASONE SODIUM PHOSPHATE 10 MG/ML IJ SOLN
INTRAMUSCULAR | Status: DC | PRN
  Administered 2017-10-18: 10 mg via INTRAVENOUS

## 2017-10-18 MED ORDER — POLYETHYLENE GLYCOL 3350 17 G PO PACK
17.0000 g | PACK | Freq: Two times a day (BID) | ORAL | Status: DC
  Administered 2017-10-19: 17 g via ORAL
  Filled 2017-10-18 (×2): qty 1

## 2017-10-18 MED ORDER — LACTATED RINGERS IV SOLN
INTRAVENOUS | Status: DC
  Administered 2017-10-18 (×2): via INTRAVENOUS

## 2017-10-18 MED ORDER — LIDOCAINE 2% (20 MG/ML) 5 ML SYRINGE
INTRAMUSCULAR | Status: AC
  Filled 2017-10-18: qty 5

## 2017-10-18 MED ORDER — MIDAZOLAM HCL 5 MG/5ML IJ SOLN
INTRAMUSCULAR | Status: DC | PRN
  Administered 2017-10-18: 2 mg via INTRAVENOUS

## 2017-10-18 MED ORDER — EPHEDRINE SULFATE 50 MG/ML IJ SOLN
INTRAMUSCULAR | Status: DC | PRN
  Administered 2017-10-18: 5 mg via INTRAVENOUS
  Administered 2017-10-18: 10 mg via INTRAVENOUS

## 2017-10-18 MED ORDER — CEFAZOLIN SODIUM-DEXTROSE 2-4 GM/100ML-% IV SOLN
2.0000 g | Freq: Four times a day (QID) | INTRAVENOUS | Status: AC
  Administered 2017-10-18 (×2): 2 g via INTRAVENOUS
  Filled 2017-10-18 (×2): qty 100

## 2017-10-18 MED ORDER — PHENYLEPHRINE 40 MCG/ML (10ML) SYRINGE FOR IV PUSH (FOR BLOOD PRESSURE SUPPORT)
PREFILLED_SYRINGE | INTRAVENOUS | Status: DC | PRN
  Administered 2017-10-18 (×4): 80 ug via INTRAVENOUS

## 2017-10-18 MED ORDER — CALCIUM CARBONATE 1250 (500 CA) MG PO TABS
1250.0000 mg | ORAL_TABLET | Freq: Every evening | ORAL | Status: DC
  Administered 2017-10-18: 1250 mg via ORAL
  Filled 2017-10-18: qty 1

## 2017-10-18 MED ORDER — KETOROLAC TROMETHAMINE 30 MG/ML IJ SOLN: 30.0000 mg | Freq: Once | INTRAMUSCULAR | Status: DC | PRN

## 2017-10-18 MED ORDER — MENTHOL 3 MG MT LOZG: 1.0000 | LOZENGE | OROMUCOSAL | Status: DC | PRN

## 2017-10-18 MED ORDER — BUPIVACAINE-EPINEPHRINE (PF) 0.5% -1:200000 IJ SOLN
INTRAMUSCULAR | Status: AC
  Filled 2017-10-18: qty 60

## 2017-10-18 MED ORDER — ASPIRIN EC 325 MG PO TBEC
325.0000 mg | DELAYED_RELEASE_TABLET | Freq: Every day | ORAL | Status: DC
  Administered 2017-10-19: 325 mg via ORAL
  Filled 2017-10-18: qty 1

## 2017-10-18 MED ORDER — METOCLOPRAMIDE HCL 5 MG/ML IJ SOLN: 5.0000 mg | Freq: Three times a day (TID) | INTRAMUSCULAR | Status: DC | PRN

## 2017-10-18 MED ORDER — DEXAMETHASONE SODIUM PHOSPHATE 10 MG/ML IJ SOLN
INTRAMUSCULAR | Status: AC
  Filled 2017-10-18: qty 1

## 2017-10-18 MED ORDER — PHENYLEPHRINE HCL 10 MG/ML IJ SOLN
INTRAVENOUS | Status: DC | PRN
  Administered 2017-10-18: 20 ug/min via INTRAVENOUS

## 2017-10-18 MED ORDER — ROPIVACAINE HCL 5 MG/ML IJ SOLN
INTRAMUSCULAR | Status: DC | PRN
  Administered 2017-10-18 (×2): 5 mL via PERINEURAL

## 2017-10-18 MED ORDER — FENTANYL CITRATE (PF) 250 MCG/5ML IJ SOLN
INTRAMUSCULAR | Status: AC
  Filled 2017-10-18: qty 5

## 2017-10-18 MED ORDER — FENTANYL CITRATE (PF) 100 MCG/2ML IJ SOLN
INTRAMUSCULAR | Status: DC | PRN
  Administered 2017-10-18 (×2): 25 ug via INTRAVENOUS
  Administered 2017-10-18: 50 ug via INTRAVENOUS

## 2017-10-18 MED ORDER — INSULIN GLARGINE 100 UNIT/ML ~~LOC~~ SOLN
25.0000 [IU] | Freq: Every day | SUBCUTANEOUS | Status: DC
  Administered 2017-10-18: 25 [IU] via SUBCUTANEOUS
  Filled 2017-10-18: qty 0.25

## 2017-10-18 MED ORDER — DEXAMETHASONE SODIUM PHOSPHATE 10 MG/ML IJ SOLN
10.0000 mg | Freq: Three times a day (TID) | INTRAMUSCULAR | Status: DC
  Administered 2017-10-18 – 2017-10-19 (×3): 10 mg via INTRAVENOUS
  Filled 2017-10-18 (×3): qty 1

## 2017-10-18 MED ORDER — POVIDONE-IODINE 7.5 % EX SOLN
Freq: Once | CUTANEOUS | Status: DC
  Filled 2017-10-18: qty 118

## 2017-10-18 MED ORDER — BUPIVACAINE-EPINEPHRINE 0.5% -1:200000 IJ SOLN
INTRAMUSCULAR | Status: DC | PRN
  Administered 2017-10-18: 50 mL

## 2017-10-18 MED ORDER — GABAPENTIN 300 MG PO CAPS
300.0000 mg | ORAL_CAPSULE | Freq: Every day | ORAL | Status: DC
  Administered 2017-10-18: 300 mg via ORAL
  Filled 2017-10-18: qty 1

## 2017-10-18 MED ORDER — POTASSIUM CHLORIDE IN NACL 20-0.9 MEQ/L-% IV SOLN
INTRAVENOUS | Status: DC
  Administered 2017-10-18: 16:00:00 via INTRAVENOUS
  Filled 2017-10-18: qty 1000

## 2017-10-18 MED ORDER — BUPIVACAINE IN DEXTROSE 0.75-8.25 % IT SOLN
INTRATHECAL | Status: DC | PRN
  Administered 2017-10-18: 1.6 mL via INTRATHECAL

## 2017-10-18 MED ORDER — DIPHENHYDRAMINE HCL 12.5 MG/5ML PO ELIX: 12.5000 mg | ORAL_SOLUTION | ORAL | Status: DC | PRN

## 2017-10-18 MED ORDER — CHLORHEXIDINE GLUCONATE 4 % EX LIQD: 60.0000 mL | Freq: Once | CUTANEOUS | Status: DC

## 2017-10-18 MED ORDER — HYDROMORPHONE HCL 2 MG/ML IJ SOLN
0.5000 mg | INTRAMUSCULAR | Status: DC | PRN
  Administered 2017-10-19: 0.5 mg via INTRAVENOUS
  Filled 2017-10-18: qty 1

## 2017-10-18 MED ORDER — BUPIVACAINE LIPOSOME 1.3 % IJ SUSP
INTRAMUSCULAR | Status: DC | PRN
  Administered 2017-10-18: 20 mL

## 2017-10-18 MED ORDER — INSULIN ASPART 100 UNIT/ML ~~LOC~~ SOLN
0.0000 [IU] | Freq: Three times a day (TID) | SUBCUTANEOUS | Status: DC
  Administered 2017-10-18 – 2017-10-19 (×2): 4 [IU] via SUBCUTANEOUS

## 2017-10-18 MED ORDER — SODIUM CHLORIDE 0.9 % IJ SOLN
INTRAMUSCULAR | Status: DC | PRN
  Administered 2017-10-18: 50 mL

## 2017-10-18 MED ORDER — CEFAZOLIN SODIUM-DEXTROSE 2-4 GM/100ML-% IV SOLN
INTRAVENOUS | Status: AC
  Filled 2017-10-18: qty 100

## 2017-10-18 MED ORDER — OXYCODONE HCL 5 MG PO TABS
5.0000 mg | ORAL_TABLET | ORAL | Status: DC | PRN
  Administered 2017-10-18 – 2017-10-19 (×5): 10 mg via ORAL
  Filled 2017-10-18 (×4): qty 2

## 2017-10-18 MED ORDER — PROMETHAZINE HCL 25 MG/ML IJ SOLN: 6.2500 mg | INTRAMUSCULAR | Status: DC | PRN

## 2017-10-18 MED ORDER — EPHEDRINE SULFATE 50 MG/ML IJ SOLN
INTRAMUSCULAR | Status: AC
  Filled 2017-10-18: qty 1

## 2017-10-18 MED ORDER — INSULIN ASPART 100 UNIT/ML ~~LOC~~ SOLN: 0.0000 [IU] | Freq: Every day | SUBCUTANEOUS | Status: DC

## 2017-10-18 MED ORDER — PHENYLEPHRINE 40 MCG/ML (10ML) SYRINGE FOR IV PUSH (FOR BLOOD PRESSURE SUPPORT)
PREFILLED_SYRINGE | INTRAVENOUS | Status: AC
  Filled 2017-10-18: qty 10

## 2017-10-18 MED ORDER — DOCUSATE SODIUM 100 MG PO CAPS
100.0000 mg | ORAL_CAPSULE | Freq: Two times a day (BID) | ORAL | Status: DC
  Administered 2017-10-18 – 2017-10-19 (×2): 100 mg via ORAL
  Filled 2017-10-18 (×2): qty 1

## 2017-10-18 MED ORDER — ONDANSETRON HCL 4 MG PO TABS
4.0000 mg | ORAL_TABLET | Freq: Four times a day (QID) | ORAL | Status: DC | PRN
  Administered 2017-10-18: 4 mg via ORAL
  Filled 2017-10-18: qty 1

## 2017-10-18 MED ORDER — MIDAZOLAM HCL 2 MG/2ML IJ SOLN
INTRAMUSCULAR | Status: AC
  Filled 2017-10-18: qty 2

## 2017-10-18 MED ORDER — PRAVASTATIN SODIUM 40 MG PO TABS
40.0000 mg | ORAL_TABLET | Freq: Every evening | ORAL | Status: DC
  Administered 2017-10-18: 40 mg via ORAL
  Filled 2017-10-18: qty 1

## 2017-10-18 MED ORDER — SODIUM CHLORIDE 0.9 % IR SOLN
Status: DC | PRN
  Administered 2017-10-18: 3000 mL

## 2017-10-18 MED ORDER — PHENOL 1.4 % MT LIQD: 1.0000 | OROMUCOSAL | Status: DC | PRN

## 2017-10-18 MED ORDER — ONDANSETRON HCL 4 MG/2ML IJ SOLN
INTRAMUSCULAR | Status: DC | PRN
  Administered 2017-10-18: 4 mg via INTRAVENOUS

## 2017-10-18 MED ORDER — ONDANSETRON HCL 4 MG/2ML IJ SOLN: 4.0000 mg | Freq: Four times a day (QID) | INTRAMUSCULAR | Status: DC | PRN

## 2017-10-18 MED ORDER — INSULIN ASPART 100 UNIT/ML ~~LOC~~ SOLN
6.0000 [IU] | Freq: Three times a day (TID) | SUBCUTANEOUS | Status: DC
  Administered 2017-10-19: 6 [IU] via SUBCUTANEOUS

## 2017-10-18 MED ORDER — PROPOFOL 10 MG/ML IV BOLUS
INTRAVENOUS | Status: AC
  Filled 2017-10-18: qty 20

## 2017-10-18 MED ORDER — MEPERIDINE HCL 50 MG/ML IJ SOLN: 6.2500 mg | INTRAMUSCULAR | Status: DC | PRN

## 2017-10-18 MED ORDER — ALUM & MAG HYDROXIDE-SIMETH 200-200-20 MG/5ML PO SUSP: 30.0000 mL | ORAL | Status: DC | PRN

## 2017-10-18 MED ORDER — HYDROMORPHONE HCL 2 MG/ML IJ SOLN: 0.3000 mg | INTRAMUSCULAR | Status: DC | PRN

## 2017-10-18 SURGICAL SUPPLY — 63 items
BANDAGE ESMARK 6X9 LF (GAUZE/BANDAGES/DRESSINGS) ×1 IMPLANT
BENZOIN TINCTURE PRP APPL 2/3 (GAUZE/BANDAGES/DRESSINGS) ×3 IMPLANT
BLADE SAGITTAL 25.0X1.19X90 (BLADE) ×2 IMPLANT
BLADE SAGITTAL 25.0X1.19X90MM (BLADE) ×1
BLADE SAW SGTL 13X75X1.27 (BLADE) ×3 IMPLANT
BLADE SURG 10 STRL SS (BLADE) ×6 IMPLANT
BNDG ELASTIC 6X15 VLCR STRL LF (GAUZE/BANDAGES/DRESSINGS) ×3 IMPLANT
BNDG ESMARK 6X9 LF (GAUZE/BANDAGES/DRESSINGS) ×3
BOWL SMART MIX CTS (DISPOSABLE) ×3 IMPLANT
CAPT KNEE TOTAL 3 ATTUNE ×3 IMPLANT
CEMENT HV SMART SET (Cement) ×6 IMPLANT
CLOSURE WOUND 1/2 X4 (GAUZE/BANDAGES/DRESSINGS) ×1
COVER SURGICAL LIGHT HANDLE (MISCELLANEOUS) ×3 IMPLANT
CUFF TOURNIQUET SINGLE 34IN LL (TOURNIQUET CUFF) ×3 IMPLANT
DRAPE EXTREMITY T 121X128X90 (DRAPE) ×3 IMPLANT
DRAPE HALF SHEET 40X57 (DRAPES) ×6 IMPLANT
DRAPE INCISE IOBAN 66X45 STRL (DRAPES) ×3 IMPLANT
DRAPE ORTHO SPLIT 77X108 STRL (DRAPES) ×2
DRAPE SURG ORHT 6 SPLT 77X108 (DRAPES) ×1 IMPLANT
DRAPE U-SHAPE 47X51 STRL (DRAPES) ×3 IMPLANT
DRSG AQUACEL AG ADV 3.5X10 (GAUZE/BANDAGES/DRESSINGS) ×3 IMPLANT
DURAPREP 26ML APPLICATOR (WOUND CARE) ×6 IMPLANT
ELECT CAUTERY BLADE 6.4 (BLADE) ×3 IMPLANT
ELECT REM PT RETURN 9FT ADLT (ELECTROSURGICAL) ×3
ELECTRODE REM PT RTRN 9FT ADLT (ELECTROSURGICAL) ×1 IMPLANT
FACESHIELD WRAPAROUND (MASK) ×3 IMPLANT
GLOVE BIO SURGEON STRL SZ7 (GLOVE) ×15 IMPLANT
GLOVE BIOGEL PI IND STRL 7.0 (GLOVE) ×1 IMPLANT
GLOVE BIOGEL PI IND STRL 7.5 (GLOVE) ×1 IMPLANT
GLOVE BIOGEL PI INDICATOR 7.0 (GLOVE) ×2
GLOVE BIOGEL PI INDICATOR 7.5 (GLOVE) ×2
GLOVE SS BIOGEL STRL SZ 7.5 (GLOVE) ×1 IMPLANT
GLOVE SUPERSENSE BIOGEL SZ 7.5 (GLOVE) ×2
GOWN STRL REUS W/ TWL LRG LVL3 (GOWN DISPOSABLE) ×1 IMPLANT
GOWN STRL REUS W/ TWL XL LVL3 (GOWN DISPOSABLE) ×1 IMPLANT
GOWN STRL REUS W/TWL LRG LVL3 (GOWN DISPOSABLE) ×2
GOWN STRL REUS W/TWL XL LVL3 (GOWN DISPOSABLE) ×2
HANDPIECE INTERPULSE COAX TIP (DISPOSABLE) ×2
HOOD PEEL AWAY FACE SHEILD DIS (HOOD) ×9 IMPLANT
IMMOBILIZER KNEE 22 UNIV (SOFTGOODS) ×3 IMPLANT
KIT BASIN OR (CUSTOM PROCEDURE TRAY) ×3 IMPLANT
KIT TURNOVER KIT B (KITS) ×3 IMPLANT
MANIFOLD NEPTUNE II (INSTRUMENTS) ×3 IMPLANT
MARKER SKIN DUAL TIP RULER LAB (MISCELLANEOUS) ×3 IMPLANT
NEEDLE HYPO 22GX1.5 SAFETY (NEEDLE) ×6 IMPLANT
NS IRRIG 1000ML POUR BTL (IV SOLUTION) ×3 IMPLANT
PACK TOTAL JOINT (CUSTOM PROCEDURE TRAY) ×3 IMPLANT
PAD ARMBOARD 7.5X6 YLW CONV (MISCELLANEOUS) ×6 IMPLANT
SET HNDPC FAN SPRY TIP SCT (DISPOSABLE) ×1 IMPLANT
STRIP CLOSURE SKIN 1/2X4 (GAUZE/BANDAGES/DRESSINGS) ×2 IMPLANT
SUCTION FRAZIER HANDLE 10FR (MISCELLANEOUS) ×2
SUCTION TUBE FRAZIER 10FR DISP (MISCELLANEOUS) ×1 IMPLANT
SUT MNCRL AB 3-0 PS2 18 (SUTURE) ×6 IMPLANT
SUT VIC AB 0 CT1 27 (SUTURE) ×4
SUT VIC AB 0 CT1 27XBRD ANBCTR (SUTURE) ×2 IMPLANT
SUT VIC AB 1 CT1 27 (SUTURE) ×2
SUT VIC AB 1 CT1 27XBRD ANBCTR (SUTURE) ×1 IMPLANT
SUT VIC AB 2-0 CT1 27 (SUTURE) ×4
SUT VIC AB 2-0 CT1 TAPERPNT 27 (SUTURE) ×2 IMPLANT
SYR CONTROL 10ML LL (SYRINGE) ×6 IMPLANT
TOWEL OR 17X26 10 PK STRL BLUE (TOWEL DISPOSABLE) ×3 IMPLANT
TRAY CATH 16FR W/PLASTIC CATH (SET/KITS/TRAYS/PACK) IMPLANT
TRAY FOLEY CATH SILVER 16FR (SET/KITS/TRAYS/PACK) ×3 IMPLANT

## 2017-10-18 NOTE — Anesthesia Procedure Notes (Signed)
Procedure Name: MAC Date/Time: 10/18/2017 7:20 AM Performed by: Harden Mo, CRNA Pre-anesthesia Checklist: Patient identified, Emergency Drugs available, Suction available and Patient being monitored Patient Re-evaluated:Patient Re-evaluated prior to induction Oxygen Delivery Method: Simple face mask Preoxygenation: Pre-oxygenation with 100% oxygen Induction Type: IV induction Placement Confirmation: positive ETCO2 and breath sounds checked- equal and bilateral Dental Injury: Teeth and Oropharynx as per pre-operative assessment

## 2017-10-18 NOTE — Transfer of Care (Signed)
Immediate Anesthesia Transfer of Care Note  Patient: Kelly Savage  Procedure(s) Performed: TOTAL KNEE ARTHROPLASTY (Left Knee)  Patient Location: PACU  Anesthesia Type:MAC, Regional and Spinal  Level of Consciousness: awake, alert  and oriented  Airway & Oxygen Therapy: Patient Spontanous Breathing  Post-op Assessment: Report given to RN and Post -op Vital signs reviewed and stable  Post vital signs: Reviewed and stable  Last Vitals:  Vitals Value Taken Time  BP 106/65 10/18/2017  9:28 AM  Temp    Pulse 86 10/18/2017  9:28 AM  Resp 16 10/18/2017  9:28 AM  SpO2 94 % 10/18/2017  9:28 AM    Last Pain:  Vitals:   10/18/17 0602  TempSrc:   PainSc: 3       Patients Stated Pain Goal: 3 (41/36/43 8377)  Complications: No apparent anesthesia complications

## 2017-10-18 NOTE — Anesthesia Procedure Notes (Signed)
Spinal  Patient location during procedure: OR Start time: 10/18/2017 7:21 AM End time: 10/18/2017 7:25 AM Staffing Anesthesiologist: Leilani AbleHatchett, Markeya Mincy, MD Performed: anesthesiologist  Preanesthetic Checklist Completed: patient identified, site marked, surgical consent, pre-op evaluation, timeout performed, IV checked, risks and benefits discussed and monitors and equipment checked Spinal Block Patient position: sitting Prep: ChloraPrep and site prepped and draped Patient monitoring: continuous pulse ox and blood pressure Approach: midline Location: L3-4 Injection technique: single-shot Needle Needle type: Pencan  Needle gauge: 24 G Needle insertion depth: 6 cm Assessment Sensory level: T8

## 2017-10-18 NOTE — Evaluation (Signed)
Physical Therapy Evaluation Patient Details Name: Kelly SpareCheryl D Freelove MRN: [id number] DOB: 05/03/1956 Today's Date: 10/18/2017   History of Present Illness  Pt is a 62 y/o female s/p elective L TKA. PMH includes obesity and DM.   Clinical Impression  Pt is s/p surgery above with deficits below. Gait distance limited to within the room as pt with increased calf pain. Required min to min guard A for mobility with RW. Educated about knee precautions and supine HEP. Will continue to follow acutely to maximize functional mobility independence and safety.     Follow Up Recommendations Follow surgeon's recommendation for DC plan and follow-up therapies;Supervision for mobility/OOB    Equipment Recommendations  Rolling walker with 5" wheels;3in1 (PT)    Recommendations for Other Services OT consult     Precautions / Restrictions Precautions Precautions: Knee Precaution Booklet Issued: Yes (comment) Precaution Comments: REviewed knee precautions and supine HEP with pt.  Required Braces or Orthoses: Knee Immobilizer - Left Knee Immobilizer - Left: Other (comment)(until discontinued. ) Restrictions Weight Bearing Restrictions: Yes LLE Weight Bearing: Weight bearing as tolerated      Mobility  Bed Mobility Overal bed mobility: Needs Assistance Bed Mobility: Supine to Sit     Supine to sit: Min assist     General bed mobility comments: Min A for LLE management  Transfers Overall transfer level: Needs assistance Equipment used: Rolling walker (2 wheeled) Transfers: Sit to/from Stand Sit to Stand: Min assist         General transfer comment: Min A for lift assist and steadying assist. Verbal cues for safe hand placement.   Ambulation/Gait Ambulation/Gait assistance: Min guard Gait Distance (Feet): 15 Feet Assistive device: Rolling walker (2 wheeled) Gait Pattern/deviations: Step-to pattern;Decreased step length - right;Decreased step length - left;Decreased weight shift to  left;Antalgic Gait velocity: Decreased    General Gait Details: Slow, antalgic gait. Distance limited to within the room as pt with increased calf pain. Verbal cues for sequencing using RW.   Stairs            Wheelchair Mobility    Modified Rankin (Stroke Patients Only)       Balance Overall balance assessment: Needs assistance Sitting-balance support: Feet supported;No upper extremity supported Sitting balance-Leahy Scale: Good     Standing balance support: Bilateral upper extremity supported;During functional activity Standing balance-Leahy Scale: Poor Standing balance comment: Reliant on BUE support.                              Pertinent Vitals/Pain Pain Assessment: 0-10 Pain Score: 2  Pain Location: L knee, L calf  Pain Descriptors / Indicators: Aching;Operative site guarding Pain Intervention(s): Limited activity within patient's tolerance;Monitored during session;Repositioned    Home Living Family/patient expects to be discharged to:: Private residence Living Arrangements: Spouse/significant other Available Help at Discharge: Family;Available 24 hours/day(children will be able to assist.) Type of Home: House Home Access: Stairs to enter Entrance Stairs-Rails: Right Entrance Stairs-Number of Steps: 2 Home Layout: Two level;Able to live on main level with bedroom/bathroom Home Equipment: None      Prior Function Level of Independence: Independent         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 Deficits consistent with post op pain and weakness. Able to perform ther ex below.     Cervical / Trunk Assessment Cervical / Trunk Assessment: Normal  Communication   Communication: No  difficulties  Cognition Arousal/Alertness: Awake/alert Behavior During Therapy: WFL for tasks assessed/performed Overall Cognitive Status: Within Functional Limits for tasks assessed                                        General Comments      Exercises Total Joint Exercises Ankle Circles/Pumps: AROM;Both;20 reps Quad Sets: AROM;Left;10 reps Heel Slides: AROM;Left;10 reps   Assessment/Plan    PT Assessment Patient needs continued PT services  PT Problem List Decreased strength;Decreased activity tolerance;Decreased balance;Decreased mobility;Decreased knowledge of precautions;Decreased knowledge of use of DME;Pain       PT Treatment Interventions DME instruction;Gait training;Stair training;Therapeutic activities;Functional mobility training;Therapeutic exercise;Balance training;Patient/family education    PT Goals (Current goals can be found in the Care Plan section)  Acute Rehab PT Goals Patient Stated Goal: to go home  PT Goal Formulation: With patient Time For Goal Achievement: 11/01/17 Potential to Achieve Goals: Good    Frequency 7X/week   Barriers to discharge        Co-evaluation               AM-PAC PT "6 Clicks" Daily Activity  Outcome Measure Difficulty turning over in bed (including adjusting bedclothes, sheets and blankets)?: A Little Difficulty moving from lying on back to sitting on the side of the bed? : Unable Difficulty sitting down on and standing up from a chair with arms (e.g., wheelchair, bedside commode, etc,.)?: Unable Help needed moving to and from a bed to chair (including a wheelchair)?: A Little Help needed walking in hospital room?: A Little Help needed climbing 3-5 steps with a railing? : A Lot 6 Click Score: 13    End of Session Equipment Utilized During Treatment: Gait belt;Left knee immobilizer Activity Tolerance: Patient limited by pain Patient left: in chair;with call bell/phone within reach Nurse  Communication: Mobility status PT Visit Diagnosis: Other abnormalities of gait and mobility (R26.89);Pain Pain - Right/Left: Left Pain - part of body: Knee(calf )    Time: 2130-8657 PT Time Calculation (min) (ACUTE ONLY): 29 min   Charges:   PT Evaluation $PT Eval Low Complexity: 1 Low PT Treatments $Therapeutic Activity: 8-22 mins   PT G Codes:        Gladys Damme, PT, DPT  Acute Rehabilitation Services  Pager: [phone]   Lehman Prom 10/18/2017, 6:45 PM

## 2017-10-18 NOTE — Addendum Note (Signed)
Addendum  created 10/18/17 1215 by Nils PyleBell, Kaniya Trueheart T, CRNA   Charge Capture section accepted

## 2017-10-18 NOTE — Progress Notes (Signed)
Orthopedic Tech Progress Note Patient Details:  Kelly Savage 06/30/1955 [id number]  CPM Left Knee CPM Left Knee: On Left Knee Flexion (Degrees): 90 Left Knee Extension (Degrees): 0 Additional Comments: trapeze bar patient helper  Post Interventions Patient Tolerated: Well Instructions Provided: Care of device Viewed order from doctor's order list Nikki DomCrawford, Kateena Degroote 10/18/2017, 10:17 AM

## 2017-10-18 NOTE — Op Note (Signed)
MRN:     [id number] DOB/AGE:    08/20/1955 / 62 y.o.       OPERATIVE REPORT   DATE OF PROCEDURE:  10/18/2017      PREOPERATIVE DIAGNOSIS:   Primary Localized Osteoarthritis left Knee       Estimated body mass index is 34.85 kg/m as calculated from the following:   Height as of 10/07/17: 5\' 9"  (1.753 m).   Weight as of this encounter: 107 kg (236 lb).                                                       POSTOPERATIVE DIAGNOSIS:   Same                                                                 PROCEDURE:  Procedure(s): TOTAL KNEE ARTHROPLASTY Using Depuy Attune RP implants #6 Femur, #6Tibia, 6mm  RP bearing, 32 Patella    SURGEON: Denaisha Swango A. Thurston HoleWainer, MD   ASSISTANT: Julien GirtKirstin Shepperson, PA-C, present and scrubbed throughout the case, critical for retraction, instrumentation, and closure.  ANESTHESIA: Spinal with Adductor Nerve Block  TOURNIQUET TIME: 58 minutes   COMPLICATIONS:  None       SPECIMENS: None   INDICATIONS FOR PROCEDURE: The patient has djd of the knee with varus deformities, XR shows bone on bone arthritis. Patient has failed all conservative measures including anti-inflammatory medicines, narcotics, attempts at exercise and weight loss, cortisone injections and viscosupplementation.  Risks and benefits of surgery have been discussed, questions answered.    DESCRIPTION OF PROCEDURE: The patient identified by armband, received right adductor canal block and IV antibiotics, [facility]. Patient taken to the operating room, appropriate anesthetic monitors were attached. Spinal anesthesia induced with the patient in supine position, Foley catheter was inserted. Tourniquet applied high to the operative thigh. Lateral post and foot positioner applied to the table, the lower extremity was then prepped and draped in usual sterile fashion from the ankle to the tourniquet. Time-out procedure was performed. The limb was wrapped with an Esmarch bandage  and the tourniquet inflated to 365 mmHg.   We began the operation by making a 6cm anterior midline incision. Small bleeders in the skin and the subcutaneous tissue identified and cauterized. Transverse retinaculum was incised and reflected medially and a medial parapatellar arthrotomy was accomplished. the patella was everted and theprepatellar fat pad resected. The superficial medial collateral ligament was then elevated from anterior to posterior along the proximal flare of the tibia and anterior half of the menisci resected. The knee was hyperflexed exposing bone on bone arthritis. Peripheral and notch osteophytes as well as the cruciate ligaments were then resected. We continued to work our way around posteriorly along the proximal tibia, and externally rotated the tibia subluxing it out from underneath the femur. A McHale retractor was placed through the notch and a lateral Hohmann retractor placed, and an external tibial guide was placed.  The tibial cutting guide was pinned into place allowing resection of 6 mm of bone medially and about 4 mm of bone laterally because of  her valgus deformity.   Satisfied with the tibial resection, we then entered the distal femur 2 mm anterior to the PCL origin with the intramedullary guide rod and applied the distal femoral cutting guide set at 11mm, with 5 degrees of valgus. This was pinned along the epicondylar axis. At this point, the distal femoral cut was accomplished without difficulty. We then sized for a 6 femoral component and pinned the guide in 3 degrees of external rotation.The chamfer cutting guide was pinned into place. The anterior, posterior, and chamfer cuts were accomplished without difficulty followed by the  RP box cutting guide and the box cut. We also removed posterior osteophytes from the posterior femoral condyles. At this time, the knee was brought into full extension. We checked our extension and flexion gaps and found them symmetric at  6.  The patella thickness measured at 9m m. We set the cutting guide at 15 and removed the posterior patella sized for 32 button and drilled the lollipop. The knee was then once again hyperflexed exposing the proximal tibia. We sized for a # 6 tibial base plate, applied the smokestack and the conical reamer followed by the the Delta fin keel punch. We then hammered into place the  RP trial femoral component, inserted a trial bearing, trial patellar button, and took the knee through range of motion from 0-130 degrees. No thumb pressure was required for patellar tracking.   At this point, all trial components were removed, a double batch of DePuy HV cement  was mixed and applied to all bony metallic mating surfaces. In order, we hammered into place the tibial tray and removed excess cement, the femoral component and removed excess cement, a 6 mm  RP bearing was inserted, and the knee brought to full extension with compression. The patellar button was clamped into place, and excess cement removed. While the cement cured the wound was irrigated out with normal saline solution pulse lavage, and exparel was injected throughout the knee. Ligament stability and patellar tracking were checked and found to be excellent..   The parapatellar arthrotomy was closed with  #1 Vicryl suture. The subcutaneous tissue with 0 and 2-0 undyed Vicryl suture, and 4-0 Monocryl.. A dressing of Aquaseal, 4 x 4, dressing sponges, Webril, and Ace wrap applied. Needle and sponge count were correct times 2.The patient awakened, extubated, and taken to recovery room without difficulty. Vascular status was normal, pulses 2+ and symmetric.    Nilda Simmer 07/26/2017, 8:56 AM

## 2017-10-18 NOTE — Interval H&P Note (Signed)
History and Physical Interval Note:  10/18/2017 6:24 AM  Kelly Savage  has presented today for surgery, with the diagnosis of OA LEFT KNEE  The various methods of treatment have been discussed with the patient and family. After consideration of risks, benefits and other options for treatment, the patient has consented to  Procedure(s): TOTAL KNEE ARTHROPLASTY (Left) as a surgical intervention .  The patient's history has been reviewed, patient examined, no change in status, stable for surgery.  I have reviewed the patient's chart and labs.  Questions were answered to the patient's satisfaction.     Nilda Simmerobert A Wilton Thrall

## 2017-10-18 NOTE — Anesthesia Procedure Notes (Signed)
Anesthesia Regional Block: Adductor canal block   Pre-Anesthetic Checklist: ,, timeout performed, Correct Patient, Correct Site, Correct Laterality, Correct Procedure, Correct Position, site marked, Risks and benefits discussed,  Surgical consent,  Pre-op evaluation,  At surgeon's request and post-op pain management  Laterality: Left and Lower  Prep: chloraprep       Needles:  Injection technique: Single-shot  Needle Type: Echogenic Stimulator Needle     Needle Length: 10cm  Needle Gauge: 21   Needle insertion depth: 2.5 cm   Additional Needles:   Procedures:,,,, ultrasound used (permanent image in chart),,,,  Narrative:  Start time: 10/18/2017 7:01 AM End time: 10/18/2017 7:11 AM Injection made incrementally with aspirations every 5 mL.  Performed by: Personally  Anesthesiologist: Leilani AbleHatchett, Brayden Brodhead, MD

## 2017-10-18 NOTE — Anesthesia Postprocedure Evaluation (Signed)
Anesthesia Post Note  Patient: Kelly Savage  Procedure(s) Performed: TOTAL KNEE ARTHROPLASTY (Left Knee)     Patient location during evaluation: PACU Anesthesia Type: Spinal Level of consciousness: awake Pain management: pain level controlled Vital Signs Assessment: post-procedure vital signs reviewed and stable Respiratory status: spontaneous breathing Cardiovascular status: stable Postop Assessment: no headache, no backache, spinal receding, patient able to bend at knees and no apparent nausea or vomiting Anesthetic complications: no    Last Vitals:  Vitals:   10/18/17 1030 10/18/17 1045  BP: 109/66 114/66  Pulse: (!) 56 (!) 58  Resp: 16 12  Temp:    SpO2: 97% 98%    Last Pain:  Vitals:   10/18/17 1030  TempSrc:   PainSc: 0-No pain   Pain Goal: Patients Stated Pain Goal: 3 (10/18/17 0602)               Nadelyn Enriques JR,JOHN Susann GivensFRANKLIN

## 2017-10-19 ENCOUNTER — Encounter (HOSPITAL_COMMUNITY): Payer: Self-pay | Admitting: General Practice

## 2017-10-19 ENCOUNTER — Other Ambulatory Visit: Payer: Self-pay

## 2017-10-19 DIAGNOSIS — M1712 Unilateral primary osteoarthritis, left knee: Secondary | ICD-10-CM | POA: Diagnosis not present

## 2017-10-19 DIAGNOSIS — Z96652 Presence of left artificial knee joint: Secondary | ICD-10-CM | POA: Diagnosis not present

## 2017-10-19 LAB — BASIC METABOLIC PANEL
Anion gap: 11 (ref 5–15)
BUN: 17 mg/dL (ref 6–20)
CO2: 25 mmol/L (ref 22–32)
Calcium: 9.5 mg/dL (ref 8.9–10.3)
Chloride: 102 mmol/L (ref 101–111)
Creatinine, Ser: 0.82 mg/dL (ref 0.44–1.00)
GFR calc Af Amer: >60 mL/min (ref >60)
GFR calc non Af Amer: >60 mL/min (ref >60)
Glucose, Bld: 156 mg/dL — ABNORMAL HIGH (ref 65–99)
Potassium: 4.2 mmol/L (ref 3.5–5.1)
Sodium: 138 mmol/L (ref 135–145)

## 2017-10-19 LAB — GLUCOSE, CAPILLARY: Glucose-Capillary: 162 mg/dL — ABNORMAL HIGH (ref 65–99)

## 2017-10-19 LAB — CBC
HCT: 39.2 % (ref 36.0–46.0)
Hemoglobin: 13.1 g/dL (ref 12.0–15.0)
MCH: 29.7 pg (ref 26.0–34.0)
MCHC: 33.4 g/dL (ref 30.0–36.0)
MCV: 88.9 fL (ref 78.0–100.0)
Platelets: 213 10*3/uL (ref 150–400)
RBC: 4.41 MIL/uL (ref 3.87–5.11)
RDW: 12.6 % (ref 11.5–15.5)
WBC: 12.3 10*3/uL — ABNORMAL HIGH (ref 4.0–10.5)

## 2017-10-19 MED ORDER — DOCUSATE SODIUM 100 MG PO CAPS: ORAL_CAPSULE | ORAL | 0 refills | Status: DC

## 2017-10-19 MED ORDER — ASPIRIN 325 MG PO TBEC: DELAYED_RELEASE_TABLET | ORAL | 0 refills | Status: DC

## 2017-10-19 MED ORDER — POLYETHYLENE GLYCOL 3350 17 G PO PACK: PACK | ORAL | 0 refills | Status: DC

## 2017-10-19 MED ORDER — OXYCODONE HCL 5 MG PO TABS: ORAL_TABLET | ORAL | 0 refills | Status: DC

## 2017-10-19 MED FILL — oxyCODONE HCL 5 MG TABS: 5 | 7 days supply | Qty: 42 | Fill #0

## 2017-10-19 MED FILL — ASPIRIN EC 325 MG TABLET: 325 | 30 days supply | Qty: 30 | Fill #0

## 2017-10-19 NOTE — Evaluation (Signed)
Occupational Therapy Evaluation Patient Details Name: Kelly SpareCheryl D Letendre MRN: [id number] DOB: 01/14/1956 Today's Date: 10/19/2017    History of Present Illness Pt is a 62 y/o female s/p elective L TKA. PMH includes obesity and DM.    Clinical Impression   This 62 y/o female presents with the above. At baseline pt is independent with ADLs and functional mobility. Pt completing room level functional mobility and shower transfer using RW with minguard assist this session; currently requires MinA for LB ADLs secondary to LLE functional limitations. Pt reports she will return home with family who are able to assist with ADLs PRN. Education provided and questions answered throughout with pt verbalizing and return demonstrating good understanding. No further acute OT needs identified at this time. Will sign off.     Follow Up Recommendations  Follow surgeon's recommendation for DC plan and follow-up therapies;Supervision/Assistance - 24 hour(24hr initially )    Equipment Recommendations  3 in 1 bedside commode           Precautions / Restrictions Precautions Precautions: Knee Precaution Comments: REviewed knee precautions Restrictions Weight Bearing Restrictions: Yes LLE Weight Bearing: Weight bearing as tolerated      Mobility Bed Mobility               General bed mobility comments: OOB in recliner upon arrival   Transfers Overall transfer level: Needs assistance Equipment used: Rolling walker (2 wheeled) Transfers: Sit to/from Stand Sit to Stand: Min guard         General transfer comment: for safety, no physical assist needed     Balance Overall balance assessment: Needs assistance Sitting-balance support: Feet supported;No upper extremity supported Sitting balance-Leahy Scale: Good     Standing balance support: Bilateral upper extremity supported;During functional activity Standing balance-Leahy Scale: Fair Standing balance comment: able to static stand without UE  support                            ADL either performed or assessed with clinical judgement   ADL Overall ADL's : Needs assistance/impaired Eating/Feeding: Independent;Sitting   Grooming: Min guard;Standing   Upper Body Bathing: Supervision/ safety;Sitting   Lower Body Bathing: Min guard;Sit to/from stand   Upper Body Dressing : Sitting;Modified independent   Lower Body Dressing: Minimal assistance;Sit to/from stand Lower Body Dressing Details (indicate cue type and reason): educated on donning operated LE first  Toilet Transfer: Min guard;Ambulation;BSC;RW Toilet Transfer Details (indicate cue type and reason): BSC over toilet; simulated in transfer to/from recliner  Toileting- ArchitectClothing Manipulation and Hygiene: Min guard;Sit to/from stand   Tub/ Shower Transfer: Walk-in shower;Min guard;Ambulation;Shower Dealerseat;Rolling walker Tub/Shower Transfer Details (indicate cue type and reason): simulated in room; pt with good recall of education provided, handout also issued to reinforce transfer technique  Functional mobility during ADLs: Min guard;Rolling walker General ADL Comments: edcuated on safety and compensatory strategies for completing ADLs      Vision         Perception     Praxis      Pertinent Vitals/Pain Pain Assessment: 0-10 Pain Score: 1  Pain Location: L knee Pain Descriptors / Indicators: Aching;Operative site guarding Pain Intervention(s): Monitored during session;Premedicated before session;Ice applied     Hand Dominance     Extremity/Trunk Assessment Upper Extremity Assessment Upper Extremity Assessment: Overall WFL for tasks assessed   Lower Extremity Assessment Lower Extremity Assessment: Defer to PT evaluation       Communication Communication Communication: No  difficulties   Cognition Arousal/Alertness: Awake/alert Behavior During Therapy: WFL for tasks assessed/performed Overall Cognitive Status: Within Functional Limits for  tasks assessed                                                      Home Living Family/patient expects to be discharged to:: Private residence Living Arrangements: Spouse/significant other Available Help at Discharge: Family;Available 24 hours/day Type of Home: House Home Access: Stairs to enter [employer] of Steps: 2 Entrance Stairs-Rails: Right Home Layout: Two level;Able to live on main level with bedroom/bathroom     Bathroom Shower/Tub: Producer, television/film/video: Standard     Home Equipment: Shower seat          Prior Functioning/Environment Level of Independence: Independent        Comments: Was working as an Aeronautical engineer Problem List: Decreased activity tolerance;Decreased range of motion;Impaired balance (sitting and/or standing);Decreased knowledge of use of DME or AE      OT Treatment/Interventions:      OT Goals(Current goals can be found in the care plan section) Acute Rehab OT Goals Patient Stated Goal: to go home  OT Goal Formulation: All assessment and education complete, DC therapy  OT Frequency:     Barriers to D/C:            Co-evaluation              AM-PAC PT "6 Clicks" Daily Activity     Outcome Measure Help from another person eating meals?: None Help from another person taking care of personal grooming?: None Help from another person toileting, which includes using toliet, bedpan, or urinal?: A Little Help from another person bathing (including washing, rinsing, drying)?: A Little Help from another person to put on and taking off regular upper body clothing?: None Help from another person to put on and taking off regular lower body clothing?: A Little 6 Click Score: 21   End of Session Equipment Utilized During Treatment: Gait belt;Rolling walker Nurse Communication: Mobility status  Activity Tolerance: Patient tolerated treatment well Patient left: in chair;with call bell/phone  within reach  OT Visit Diagnosis: Other abnormalities of gait and mobility (R26.89)                Time: 1610-9604 OT Time Calculation (min): 31 min Charges:  OT General Charges $OT Visit: 1 Visit OT Evaluation $OT Eval Low Complexity: 1 Low OT Treatments $Self Care/Home Management : 8-22 mins G-Codes:     Marcy Siren, OT Pager [phone] 10/19/2017   Orlando Penner 10/19/2017, 10:27 AM

## 2017-10-19 NOTE — Discharge Summary (Signed)
Patient ID: Kelly Savage MRN: [id number] DOB/AGE: 62-May-1957 62 y.o.  Admit date: 10/18/2017 Discharge date: 10/19/2017  Admission Diagnoses:  Principal Problem:   Primary localized osteoarthritis of left knee Active Problems:   Diabetes mellitus type 2 in obese (HCC)   Morbid obesity (HCC)   Bilateral primary osteoarthritis of knee   Hyperlipidemia with target LDL less than 100   Back spasm   Vitamin D deficiency   Discharge Diagnoses:  Same  Past Medical History:  Diagnosis Date  . Ankle pain x3 months   bilateral  . Depression    Due to loss of mother had a fib massive stroke, died after , she was her best friend   . Diabetes mellitus type II 2006  . Obesity all her life    lost 28 lbs in 14 weeks   . Primary localized osteoarthritis of knee    Left  . Primary localized osteoarthritis of left knee 09/23/2017  . Right thigh pain   . Thigh pain x 1 year    bilateral, esp when laying down, worse in the morning , and goes away with activity    Surgeries: Procedure(s): TOTAL KNEE ARTHROPLASTY on 10/18/2017   Consultants:   Discharged Condition: Improved  Hospital Course: Kelly Savage is an 62 y.o. female who was admitted 10/18/2017 for operative treatment ofPrimary localized osteoarthritis of left knee. Patient has severe unremitting pain that affects sleep, daily activities, and work/hobbies. After pre-op clearance the patient was taken to the operating room on 10/18/2017 and underwent  Procedure(s): TOTAL KNEE ARTHROPLASTY.    Patient was given perioperative antibiotics:  Anti-infectives (From admission, onward)   Start     Dose/Rate Route Frequency Ordered Stop   10/18/17 1600  ceFAZolin (ANCEF) IVPB 2g/100 mL premix     2 g 200 mL/hr over 30 Minutes Intravenous Every 6 hours 10/18/17 1535 10/18/17 2247   10/18/17 0630  ceFAZolin (ANCEF) 3 g in dextrose 5 % 50 mL IVPB     3 g 100 mL/hr over 30 Minutes Intravenous To ShortStay [facility] 10/15/17 0922 10/18/17 0731    10/18/17 0559  ceFAZolin (ANCEF) 2-4 GM/100ML-% IVPB    Note to Pharmacy:  Evern Bio   : cabinet override      10/18/17 0559 10/18/17 1814       Patient was given sequential compression devices, early ambulation, and chemoprophylaxis to prevent DVT.  Patient benefited maximally from hospital stay and there were no complications.    Recent vital signs:  Patient Vitals for the past 24 hrs:  BP Temp Temp src Pulse Resp SpO2  10/19/17 0408 122/70 98 F (36.7 C) Oral 60 13 94 %  10/18/17 2348 113/66 98.1 F (36.7 C) Oral 60 15 95 %  10/18/17 2037 135/79 98.2 F (36.8 C) Oral 72 17 95 %  10/18/17 1540 130/71 97.7 F (36.5 C) Oral 69 16 94 %  10/18/17 1500 121/73 97.7 F (36.5 C) - 71 20 94 %  10/18/17 1430 120/69 - - 68 20 95 %  10/18/17 1400 123/73 - - 66 17 94 %  10/18/17 1330 132/66 - - 71 18 94 %  10/18/17 1300 118/66 - - 67 (!) 21 94 %  10/18/17 1230 116/72 - - 67 16 95 %  10/18/17 1200 116/70 - - 64 17 94 %  10/18/17 1130 116/68 - - 64 19 95 %  10/18/17 1100 114/65 97.6 F (36.4 C) - 63 17 96 %  10/18/17 1045 114/66 - - Marland Kitchen)  58 12 98 %  10/18/17 1030 109/66 - - (!) 56 16 97 %  10/18/17 1015 114/67 - - (!) 56 14 97 %  10/18/17 0959 114/65 - - 64 19 98 %  10/18/17 0945 111/63 - - 67 11 95 %  10/18/17 0928 106/65 (!) 97 F (36.1 C) - 86 16 94 %     Recent laboratory studies:  Recent Labs    10/19/17 0509  WBC 12.3*  HGB 13.1  HCT 39.2  PLT 213  NA 138  K 4.2  CL 102  CO2 25  BUN 17  CREATININE 0.82  GLUCOSE 156*  CALCIUM 9.5     Discharge Medications:   Allergies as of 10/19/2017   No Known Allergies     Medication List    STOP taking these medications   benazepril 10 MG tablet Commonly known as:  LOTENSIN   diclofenac 75 MG EC tablet Commonly known as:  VOLTAREN   HYDROcodone-acetaminophen 5-325 mg Tabs tablet Commonly known as:  VICODIN     TAKE these medications   aspirin 325 MG EC tablet 1 tab a day for the next 30 days to  prevent blood clots What changed:    medication strength  how much to take  how to take this  when to take this  additional instructions   CALCIUM 600 1500 (600 Ca) MG Tabs tablet Generic drug:  calcium carbonate Take 1,200 mg by mouth every evening.   docusate sodium 100 MG capsule Commonly known as:  COLACE 1 tab 2 times a day while on narcotics.  STOOL SOFTENER   ergocalciferol 50000 units capsule Commonly known as:  VITAMIN D2 Take 1 capsule (50,000 Units total) by mouth once a week. One capsule once weekly What changed:    when to take this  additional instructions   gabapentin 300 MG capsule Commonly known as:  NEURONTIN Take 1 capsule (300 mg total) by mouth at bedtime.   glipiZIDE 10 MG 24 hr tablet Commonly known as:  GLUCOTROL XL Take 1 tablet (10 mg total) by mouth daily with breakfast.   glipiZIDE 5 MG 24 hr tablet Commonly known as:  GLUCOTROL XL Take 1 tablet (5 mg total) by mouth daily with breakfast.   glucose blood test strip Freestyle Lite meter. Use as instructed once daily testing dx e11.9   JANUMET XR 50-1000 MG Tb24 Generic drug:  SitaGLIPtin-MetFORMIN HCl TAKE 1 TABLET BY MOUTH 2 TIMES DAILY. What changed:    how much to take  how to take this  when to take this   [facility] LANCETS Misc Once daily testing   oxyCODONE 5 MG immediate release tablet Commonly known as:  Oxy IR/ROXICODONE 1 tablets every 4 hrs as needed for pain. Patient had total knee replacement on 10/18/2017   polyethylene glycol packet Commonly known as:  MIRALAX / GLYCOLAX 17grams in 6 oz of drink twice a day until bowel movement.  LAXITIVE.  Restart if two days since last bowel movement   pravastatin 40 MG tablet Commonly known as:  PRAVACHOL TAKE 1 TABLET BY MOUTH EVERY EVENING.            Discharge Care Instructions  (From admission, onward)        Start     Ordered   10/19/17 0000  Change dressing    Comments:  DO NOT REMOVE BANDAGE  OVER SURGICAL INCISION.  WASH WHOLE LEG INCLUDING OVER THE WATERPROOF BANDAGE WITH SOAP AND WATER EVERY DAY.  10/19/17 0841      Diagnostic Studies: Dg Chest 2 View  Result Date: 09/21/2017 CLINICAL DATA:  Hypertension associated with diabetes. Preop chest exam. EXAM: CHEST - 2 VIEW COMPARISON:  Radiographs 03/20/2010 FINDINGS: Heart is normal in size. Mild aortic tortuosity, unchanged from prior exam. Pulmonary vasculature is normal. No consolidation, pleural effusion, or pneumothorax. No acute osseous abnormalities are seen. IMPRESSION: Mild chronic aortic tortuosity, stable over the past 8 years. Otherwise unremarkable radiographs of the chest. Electronically Signed   By: Rubye OaksMelanie  Ehinger M.D.   On: 09/21/2017 04:22    Disposition: Discharge disposition: 01-Home or Self Care       Discharge Instructions    CPM   Complete by:  As directed    Continuous passive motion machine (CPM):      Use the CPM from 0 to 90 for 6 hours per day.       You may break it up into 2 or 3 sessions per day.      Use CPM for 2 weeks or until you are told to stop.   Call MD / Call 911   Complete by:  As directed    If you experience chest pain or shortness of breath, CALL 911 and be transported to the hospital emergency room.  If you develope a fever above 101 F, pus (white drainage) or increased drainage or redness at the wound, or calf pain, call your surgeon's office.   Change dressing   Complete by:  As directed    DO NOT REMOVE BANDAGE OVER SURGICAL INCISION.  WASH WHOLE LEG INCLUDING OVER THE WATERPROOF BANDAGE WITH SOAP AND WATER EVERY DAY.   Constipation Prevention   Complete by:  As directed    Drink plenty of fluids.  Prune juice may be helpful.  You may use a stool softener, such as Colace (over the counter) 100 mg twice a day.  Use MiraLax (over the counter) for constipation as needed.   Diet - low sodium heart healthy   Complete by:  As directed    Discharge instructions   Complete by:   As directed    INSTRUCTIONS AFTER JOINT REPLACEMENT   Remove items at home which could result in a fall. This includes throw rugs or furniture in walking pathways ICE to the affected joint every three hours while awake for 30 minutes at a time, for at least the first 3-5 days, and then as needed for pain and swelling.  Continue to use ice for pain and swelling. You may notice swelling that will progress down to the foot and ankle.  This is normal after surgery.  Elevate your leg when you are not up walking on it.   Continue to use the breathing machine you got in the hospital (incentive spirometer) which will help keep your temperature down.  It is common for your temperature to cycle up and down following surgery, especially at night when you are not up moving around and exerting yourself.  The breathing machine keeps your lungs expanded and your temperature down.   DIET:  As you were doing prior to hospitalization, we recommend a well-balanced diet.  DRESSING / WOUND CARE / SHOWERING  Keep the surgical dressing until follow up.  The dressing is water proof, so you can shower without any extra covering.  IF THE DRESSING FALLS OFF or the wound gets wet inside, change the dressing with sterile gauze.  Please use good hand washing techniques before changing the dressing.  Do  not use any lotions or creams on the incision until instructed by your surgeon.    ACTIVITY  Increase activity slowly as tolerated, but follow the weight bearing instructions below.   No driving for 6 weeks or until further direction given by your physician.  You cannot drive while taking narcotics.  No lifting or carrying greater than 10 lbs. until further directed by your surgeon. Avoid periods of inactivity such as sitting longer than an hour when not asleep. This helps prevent blood clots.  You may return to work once you are authorized by your doctor.     WEIGHT BEARING   Weight bearing as tolerated with assist  device (walker, cane, etc) as directed, use it as long as suggested by your surgeon or therapist, typically at least 2-3 weeks.   EXERCISES  Results after joint replacement surgery are often greatly improved when you follow the exercise, range of motion and muscle strengthening exercises prescribed by your doctor. Safety measures are also important to protect the joint from further injury. Any time any of these exercises cause you to have increased pain or swelling, decrease what you are doing until you are comfortable again and then slowly increase them. If you have problems or questions, call your caregiver or physical therapist for advice.   Rehabilitation is important following a joint replacement. After just a few days of immobilization, the muscles of the leg can become weakened and shrink (atrophy).  These exercises are designed to build up the tone and strength of the thigh and leg muscles and to improve motion. Often times heat used for twenty to thirty minutes before working out will loosen up your tissues and help with improving the range of motion but do not use heat for the first two weeks following surgery (sometimes heat can increase post-operative swelling).   These exercises can be done on a training (exercise) mat, on the floor, on a table or on a bed. Use whatever works the best and is most comfortable for you.    Use music or television while you are exercising so that the exercises are a pleasant break in your day. This will make your life better with the exercises acting as a break in your routine that you can look forward to.   Perform all exercises about fifteen times, three times per day or as directed.  You should exercise both the operative leg and the other leg as well.   Exercises include:  Quad Sets - Tighten up the muscle on the front of the thigh (Quad) and hold for 5-10 seconds.   Straight Leg Raises - With your knee straight (if you were given a brace, keep it on),  lift the leg to 60 degrees, hold for 3 seconds, and slowly lower the leg.  Perform this exercise against resistance later as your leg gets stronger.  Leg Slides: Lying on your back, slowly slide your foot toward your buttocks, bending your knee up off the floor (only go as far as is comfortable). Then slowly slide your foot back down until your leg is flat on the floor again.  Angel Wings: Lying on your back spread your legs to the side as far apart as you can without causing discomfort.  Hamstring Strength:  Lying on your back, push your heel against the floor with your leg straight by tightening up the muscles of your buttocks.  Repeat, but this time bend your knee to a comfortable angle, and push your heel against the  floor.  You may put a pillow under the heel to make it more comfortable if necessary.   A rehabilitation program following joint replacement surgery can speed recovery and prevent re-injury in the future due to weakened muscles. Contact your doctor or a physical therapist for more information on knee rehabilitation.    CONSTIPATION  Constipation is defined medically as fewer than three stools per week and severe constipation as less than one stool per week.  Even if you have a regular bowel pattern at home, your normal regimen is likely to be disrupted due to multiple reasons following surgery.  Combination of anesthesia, postoperative narcotics, change in appetite and fluid intake all can affect your bowels.   YOU MUST use at least one of the following options; they are listed in order of increasing strength to get the job done.  They are all available over the counter, and you may need to use some, POSSIBLY even all of these options:    Drink plenty of fluids (prune juice may be helpful) and high fiber foods Colace 100 mg by mouth twice a day  Senokot for constipation as directed and as needed Dulcolax (bisacodyl), take with full glass of water  Miralax (polyethylene glycol) once  or twice a day as needed.  If you have tried all these things and are unable to have a bowel movement in the first 3-4 days after surgery call either your surgeon or your primary doctor.    If you experience loose stools or diarrhea, hold the medications until you stool forms back up.  If your symptoms do not get better within 1 week or if they get worse, check with your doctor.  If you experience "the worst abdominal pain ever" or develop nausea or vomiting, please contact the office immediately for further recommendations for treatment.   ITCHING:  If you experience itching with your medications, try taking only a single pain pill, or even half a pain pill at a time.  You can also use Benadryl over the counter for itching or also to help with sleep.   TED HOSE STOCKINGS:  Use stockings on both legs until for at least 2 weeks or as directed by physician office. They may be removed at night for sleeping.  MEDICATIONS:  See your medication summary on the "After Visit Summary" that nursing will review with you.  You may have some home medications which will be placed on hold until you complete the course of blood thinner medication.  It is important for you to complete the blood thinner medication as prescribed.  PRECAUTIONS:  If you experience chest pain or shortness of breath - call 911 immediately for transfer to the hospital emergency department.   If you develop a fever greater that 101 F, purulent drainage from wound, increased redness or drainage from wound, foul odor from the wound/dressing, or calf pain - CONTACT YOUR SURGEON.                                                   FOLLOW-UP APPOINTMENTS:  If you do not already have a post-op appointment, please call the office for an appointment to be seen by your surgeon.  Guidelines for how soon to be seen are listed in your "After Visit Summary", but are typically between 1-4 weeks after surgery.  OTHER INSTRUCTIONS:  Knee Replacement:  Do  not place pillow under knee, focus on keeping the knee straight while resting. CPM instructions: 0-90 degrees, 2 hours in the morning, 2 hours in the afternoon, and 2 hours in the evening. Place foam block, curve side up under heel at all times except when in CPM or when walking.  DO NOT modify, tear, cut, or change the foam block in any way.  MAKE SURE YOU:  Understand these instructions.  Get help right away if you are not doing well or get worse.    Thank you for letting us be a part of your medical care team.  It is a privilege we respect greatly.  We hope these instructions will help you stay on track for a fast and full recovery!   Do not put a pillow under the knee. Place it under the heel.   Complete by:  As directed    Place gray foam block, curve side up under heel at all times except when in CPM or when walking.  DO NOT modify, tear, cut, or change in any way the gray foam block.   Increase activity slowly as tolerated   Complete by:  As directed    Patient may shower   Complete by:  As directed    Aquacel dressing is water proof    Wash over it and the whole leg with soap and water at the end of your shower   TED hose   Complete by:  As directed    Use stockings (TED hose) for 2 weeks on both leg(s).  You may remove them at night for sleeping.      Follow-up Information    Salvatore Marvel, MD Follow up on 11/01/2017.   Specialty:  Orthopedic Surgery Why:  appointment time 4:30 pm Contact information: [address] [place] [address] [phone]        Specialists, [facility] Follow up on 11/01/2017.   Specialty:  Orthopedic Surgery Why:  Physical Therapy appointment with Zella Ball arrive at 2:45 pm for 3 pm appointment Contact information: [address] [place] [address] [phone]            Signed: Pascal Lux 10/19/2017, 8:51 AM

## 2017-10-19 NOTE — Progress Notes (Signed)
Physical Therapy Treatment Patient Details Name: Kelly SpareCheryl D Cornforth MRN: [id number] DOB: 12/10/1955 Today's Date: 10/19/2017    History of Present Illness Pt is a 62 y/o female s/p elective L TKA. PMH includes obesity and DM.     PT Comments    Pt performed gait training, stair training and review of HEP in it's entirety.  Pt is ready to d/c home from a mobility standpoint.  Informed nursing of mobility status and need for pain meds post PT session.     Follow Up Recommendations  Follow surgeon's recommendation for DC plan and follow-up therapies;Supervision for mobility/OOB     Equipment Recommendations  Rolling walker with 5" wheels;3in1 (PT)    Recommendations for Other Services       Precautions / Restrictions Precautions Precautions: Knee Precaution Booklet Issued: Yes (comment) Precaution Comments: REviewed knee precautions Restrictions Weight Bearing Restrictions: Yes LLE Weight Bearing: Weight bearing as tolerated    Mobility  Bed Mobility               General bed mobility comments: OOB in recliner upon arrival   Transfers Overall transfer level: Needs assistance Equipment used: Rolling walker (2 wheeled) Transfers: Sit to/from Stand Sit to Stand: Min guard         General transfer comment: for safety, no physical assist needed   Ambulation/Gait Ambulation/Gait assistance: Min guard Gait Distance (Feet): 325 Feet Assistive device: Rolling walker (2 wheeled) Gait Pattern/deviations: Decreased step length - right;Decreased step length - left;Decreased weight shift to left;Antalgic;Step-through pattern Gait velocity: Decreased    General Gait Details: Decreased R toe off and knee flexion in swing phase.  Pt progressed to step through pattern.  Pt tolerated session well.     Stairs Stairs: Yes Stairs assistance: Supervision Stair Management: No rails;Step to pattern;With walker Number of Stairs: 2 General stair comments: Cues for sequencing and RW  placement.  Pt able to recall sequencing.     Wheelchair Mobility    Modified Rankin (Stroke Patients Only)       Balance Overall balance assessment: Needs assistance Sitting-balance support: Feet supported;No upper extremity supported Sitting balance-Leahy Scale: Good     Standing balance support: Bilateral upper extremity supported;During functional activity Standing balance-Leahy Scale: Fair Standing balance comment: able to static stand without UE support                             Cognition Arousal/Alertness: Awake/alert Behavior During Therapy: WFL for tasks assessed/performed Overall Cognitive Status: Within Functional Limits for tasks assessed                                        Exercises Total Joint Exercises Ankle Circles/Pumps: AROM;Both;20 reps;Supine Quad Sets: AROM;10 reps;Supine;Left Towel Squeeze: AROM;Both;10 reps;Supine Short Arc Quad: AROM;Left;10 reps;Supine Heel Slides: AROM;Left;10 reps;Supine Hip ABduction/ADduction: AROM;Left;10 reps;Supine Long Arc Quad: AROM;Left;10 reps;Seated Knee Flexion: AROM;Left;20 reps;AAROM;Seated Goniometric ROM: 90 degrees flexion on L knee.      General Comments        Pertinent Vitals/Pain Pain Assessment: 0-10 Pain Score: 5  Pain Location: L knee Pain Descriptors / Indicators: Aching;Operative site guarding Pain Intervention(s): Monitored during session;Repositioned;Ice applied    Home Living Family/patient expects to be discharged to:: Private residence Living Arrangements: Spouse/significant other Available Help at Discharge: Family;Available 24 hours/day Type of Home: House Home Access: Stairs to enter Entrance  Stairs-Rails: Right Home Layout: Two level;Able to live on main level with bedroom/bathroom Home Equipment: Shower seat      Prior Function Level of Independence: Independent      Comments: Was working as an Psychologist, sport and exercise (current goals can now be found  in the care plan section) Acute Rehab PT Goals Patient Stated Goal: to go home  Potential to Achieve Goals: Good Progress towards PT goals: Progressing toward goals    Frequency    7X/week      PT Plan Current plan remains appropriate    Co-evaluation              AM-PAC PT "6 Clicks" Daily Activity  Outcome Measure  Difficulty turning over in bed (including adjusting bedclothes, sheets and blankets)?: A Little Difficulty moving from lying on back to sitting on the side of the bed? : Unable Difficulty sitting down on and standing up from a chair with arms (e.g., wheelchair, bedside commode, etc,.)?: Unable Help needed moving to and from a bed to chair (including a wheelchair)?: A Little Help needed walking in hospital room?: A Little Help needed climbing 3-5 steps with a railing? : A Little 6 Click Score: 14    End of Session Equipment Utilized During Treatment: Gait belt Activity Tolerance: Patient limited by pain Patient left: in chair;with call bell/phone within reach Nurse Communication: Mobility status PT Visit Diagnosis: Other abnormalities of gait and mobility (R26.89);Pain Pain - Right/Left: Left Pain - part of body: Knee(calf)     Time: 1033-1100 PT Time Calculation (min) (ACUTE ONLY): 27 min  Charges:  $Gait Training: 8-22 mins $Therapeutic Exercise: 8-22 mins                    G Codes:       Joycelyn Rua, PTA pager [phone]    Florestine Avers 10/19/2017, 11:17 AM

## 2017-10-19 NOTE — Progress Notes (Signed)
Berna Spareheryl D Bonifield to be D/C'd Home per MD order.  Discussed prescriptions and follow up appointments with the patient. Prescriptions given to patient, medication list explained in detail. Pt verbalized understanding.  Allergies as of 10/19/2017   No Known Allergies     Medication List    STOP taking these medications   benazepril 10 MG tablet Commonly known as:  LOTENSIN   diclofenac 75 MG EC tablet Commonly known as:  VOLTAREN   HYDROcodone-acetaminophen 5-325 mg Tabs tablet Commonly known as:  VICODIN     TAKE these medications   aspirin 325 MG EC tablet 1 tab a day for the next 30 days to prevent blood clots What changed:    medication strength  how much to take  how to take this  when to take this  additional instructions   CALCIUM 600 1500 (600 Ca) MG Tabs tablet Generic drug:  calcium carbonate Take 1,200 mg by mouth every evening.   docusate sodium 100 MG capsule Commonly known as:  COLACE 1 tab 2 times a day while on narcotics.  STOOL SOFTENER   ergocalciferol 50000 units capsule Commonly known as:  VITAMIN D2 Take 1 capsule (50,000 Units total) by mouth once a week. One capsule once weekly What changed:    when to take this  additional instructions   gabapentin 300 MG capsule Commonly known as:  NEURONTIN Take 1 capsule (300 mg total) by mouth at bedtime.   glipiZIDE 10 MG 24 hr tablet Commonly known as:  GLUCOTROL XL Take 1 tablet (10 mg total) by mouth daily with breakfast.   glipiZIDE 5 MG 24 hr tablet Commonly known as:  GLUCOTROL XL Take 1 tablet (5 mg total) by mouth daily with breakfast.   glucose blood test strip Freestyle Lite meter. Use as instructed once daily testing dx e11.9   JANUMET XR 50-1000 MG Tb24 Generic drug:  SitaGLIPtin-MetFORMIN HCl TAKE 1 TABLET BY MOUTH 2 TIMES DAILY. What changed:    how much to take  how to take this  when to take this   [facility] Misc Once daily testing   oxyCODONE 5 MG  immediate release tablet Commonly known as:  Oxy IR/ROXICODONE 1 tablets every 4 hrs as needed for pain. Patient had total knee replacement on 10/18/2017   polyethylene glycol packet Commonly known as:  MIRALAX / GLYCOLAX 17grams in 6 oz of drink twice a day until bowel movement.  LAXITIVE.  Restart if two days since last bowel movement   pravastatin 40 MG tablet Commonly known as:  PRAVACHOL TAKE 1 TABLET BY MOUTH EVERY EVENING.            Discharge Care Instructions  (From admission, onward)        Start     Ordered   10/19/17 0000  Change dressing    Comments:  DO NOT REMOVE BANDAGE OVER SURGICAL INCISION.  WASH WHOLE LEG INCLUDING OVER THE WATERPROOF BANDAGE WITH SOAP AND WATER EVERY DAY.   10/19/17 0841      Vitals:   10/18/17 2348 10/19/17 0408  BP: 113/66 122/70  Pulse: 60 60  Resp: 15 13  Temp: 98.1 F (36.7 C) 98 F (36.7 C)  SpO2: 95% 94%    Skin clean, dry and intact without evidence of skin break down, no evidence of skin tears noted. Aquacel dressing on left leg, clean, dry, and intact. TED hose in place upon discharge. Patient education completed. Patient verbalized understanding of education. IV catheter discontinued  intact. Site without signs and symptoms of complications. Dressing and pressure applied. Pt denies pain at this time. No complaints noted.  An After Visit Summary was printed and given to the patient, medications were called into pharmacy. Patient escorted via WC, and D/C home via private auto.  [place] Abrham Maslowski RN

## 2017-10-19 NOTE — Care Management Note (Signed)
Case Management Note  Patient Details  Name: Kelly Savage MRN: [id number] Date of Birth: 06/01/1955  Subjective/Objective:    62 yr old female s/p left total knee arthroplasty.                 Action/Plan: Patient was preoperatively setup with [facility], no changes. She will have family support at discharge. DME has been delivered to her home.    Expected Discharge Date:  10/19/17               Expected Discharge Plan:  Home w Home Health Services  In-House Referral:  NA  Discharge planning Services  CM Consult  Post Acute Care Choice:  Durable Medical Equipment, Home Health Choice offered to:  Patient  DME Arranged:  3-N-1, Walker rolling, CPM DME Agency:  [facility] ([facility]  HH Arranged:  PT HH [facility]  Status of Service:  Completed, signed off  If discussed at [employer] Meetings, dates discussed:    Additional Comments:  Durenda GuthrieBrady, Naydeen Speirs Naomi, RN 10/19/2017, 1:22 PM

## 2017-10-20 ENCOUNTER — Other Ambulatory Visit: Payer: Self-pay | Admitting: *Deleted

## 2017-10-20 NOTE — Patient Outreach (Signed)
[facility]) Care Management  10/20/2017  Berna SpareCheryl D Raju 05/21/1955 [id number]   Subjective: Telephone call to patient's home number, no answer, left HIPAA compliant voicemail message, and requested call back.    Objective:Per KPN (Knowledge Performance Now, point of care tool) and chart review,patient to be admitted on 10/18/17 for LeftTOTAL KNEE ARTHROPLASTY. Patient also has a history of diabetes and hyperlipidemia.     Assessment: Received [facility] Preoperative / Transition of care referral on 10/07/17.Preoperative call completed, and Transition of care follow up pending patient contact.     Plan:RNCM will send unsuccessful outreach  letter, [facility] pamph[facility], will call patient for 2nd telephone outreach attempt, transition of care follow up, and proceed with case closure, within 10 business days if no return call.      Alaija Ruble H. Gardiner Barefootooper RN, BSN, CCM [facility] Care Management [facility] Telephonic CM Phone: [phone] Fax: [phone]

## 2017-10-21 ENCOUNTER — Other Ambulatory Visit: Payer: Self-pay | Admitting: *Deleted

## 2017-10-21 DIAGNOSIS — Z6835 Body mass index (BMI) 35.0-35.9, adult: Secondary | ICD-10-CM | POA: Diagnosis not present

## 2017-10-21 DIAGNOSIS — E559 Vitamin D deficiency, unspecified: Secondary | ICD-10-CM | POA: Diagnosis not present

## 2017-10-21 DIAGNOSIS — Z96652 Presence of left artificial knee joint: Secondary | ICD-10-CM | POA: Diagnosis not present

## 2017-10-21 DIAGNOSIS — M1711 Unilateral primary osteoarthritis, right knee: Secondary | ICD-10-CM | POA: Diagnosis not present

## 2017-10-21 DIAGNOSIS — Z7982 Long term (current) use of aspirin: Secondary | ICD-10-CM | POA: Diagnosis not present

## 2017-10-21 DIAGNOSIS — F329 Major depressive disorder, single episode, unspecified: Secondary | ICD-10-CM | POA: Diagnosis not present

## 2017-10-21 DIAGNOSIS — Z471 Aftercare following joint replacement surgery: Secondary | ICD-10-CM | POA: Diagnosis not present

## 2017-10-21 DIAGNOSIS — E119 Type 2 diabetes mellitus without complications: Secondary | ICD-10-CM | POA: Diagnosis not present

## 2017-10-21 NOTE — Patient Outreach (Signed)
[facility]) Care Management  10/21/2017  Kelly Savage 12/14/1955 [id number]   Subjective: Received voicemail message from Anabel Halonheryl Gilvin, states she is returning call, and requested call back.   Telephone call from patient's home / mobile number, spoke with patient, and HIPAA verified.  Discussed [facility] Care [facility] Transition of care follow up, patient voiced understanding, and is in agreement to follow up.   Patient states she is doing much better, surgery went well, pain being managed appropriately with medications,  home health physical therapy to start today, using Continuous passive motion machine (CPM) as prescribed, home exercise program going well, and has a follow up appointment with surgeon on 11/01/17.  Patient states she is able to manage self care and has assistance as needed with activities of daily living / home management.   Patient voices understanding of medical diagnosis, surgery,  and treatment plan. [facility] benefits discussed on 10/14/17 preoperative call and patient states no additional questions at this time.  Patient states she does not have any education material, transition of care, care coordination, disease management, disease monitoring, transportation, community resource, or pharmacy needs at this time.  States she is very appreciative of the follow up and is in agreement to receive [facility] information.      Objective:Per KPN (Knowledge Performance Now, point of care tool) and chart review,patient hospitalized  10/18/17 - 10/19/17 for Primary localized osteoarthritis of left knee, and status post LeftTOTAL KNEE ARTHROPLASTY on 10/18/17.   Patient also has a history of diabetes and hyperlipidemia.      Assessment: Received [facility]MR Preoperative / Transition of care referral on 10/07/17.Preoperative call completed, and Transition of care follow up completed, no care management needs, and will proceed with case closure.        Plan:RNCM will send patient successful outreach letter, [facility] pamphlet, and magnet. RNCM will complete case closure due to follow up completed / no care management needs.      Lorriane Dehart H. Gardiner Barefootooper RN, BSN, CCM [facility] Care Management [facility] Telephonic CM Phone: [phone] Fax: [phone]

## 2017-10-26 DIAGNOSIS — Z96652 Presence of left artificial knee joint: Secondary | ICD-10-CM | POA: Diagnosis not present

## 2017-10-26 DIAGNOSIS — Z6835 Body mass index (BMI) 35.0-35.9, adult: Secondary | ICD-10-CM | POA: Diagnosis not present

## 2017-10-26 DIAGNOSIS — E119 Type 2 diabetes mellitus without complications: Secondary | ICD-10-CM | POA: Diagnosis not present

## 2017-10-26 DIAGNOSIS — Z471 Aftercare following joint replacement surgery: Secondary | ICD-10-CM | POA: Diagnosis not present

## 2017-10-26 DIAGNOSIS — E559 Vitamin D deficiency, unspecified: Secondary | ICD-10-CM | POA: Diagnosis not present

## 2017-10-26 DIAGNOSIS — F329 Major depressive disorder, single episode, unspecified: Secondary | ICD-10-CM | POA: Diagnosis not present

## 2017-10-26 DIAGNOSIS — Z7982 Long term (current) use of aspirin: Secondary | ICD-10-CM | POA: Diagnosis not present

## 2017-10-26 DIAGNOSIS — M1711 Unilateral primary osteoarthritis, right knee: Secondary | ICD-10-CM | POA: Diagnosis not present

## 2017-10-27 DIAGNOSIS — Z6835 Body mass index (BMI) 35.0-35.9, adult: Secondary | ICD-10-CM | POA: Diagnosis not present

## 2017-10-27 DIAGNOSIS — E559 Vitamin D deficiency, unspecified: Secondary | ICD-10-CM | POA: Diagnosis not present

## 2017-10-27 DIAGNOSIS — Z471 Aftercare following joint replacement surgery: Secondary | ICD-10-CM | POA: Diagnosis not present

## 2017-10-27 DIAGNOSIS — M1711 Unilateral primary osteoarthritis, right knee: Secondary | ICD-10-CM | POA: Diagnosis not present

## 2017-10-27 DIAGNOSIS — E119 Type 2 diabetes mellitus without complications: Secondary | ICD-10-CM | POA: Diagnosis not present

## 2017-10-27 DIAGNOSIS — Z96652 Presence of left artificial knee joint: Secondary | ICD-10-CM | POA: Diagnosis not present

## 2017-10-27 DIAGNOSIS — Z7982 Long term (current) use of aspirin: Secondary | ICD-10-CM | POA: Diagnosis not present

## 2017-10-27 DIAGNOSIS — F329 Major depressive disorder, single episode, unspecified: Secondary | ICD-10-CM | POA: Diagnosis not present

## 2017-10-27 MED FILL — oxyCODONE HCL 5 MG TABS: 5 | 7 days supply | Qty: 42 | Fill #0

## 2017-10-29 DIAGNOSIS — E559 Vitamin D deficiency, unspecified: Secondary | ICD-10-CM | POA: Diagnosis not present

## 2017-10-29 DIAGNOSIS — E119 Type 2 diabetes mellitus without complications: Secondary | ICD-10-CM | POA: Diagnosis not present

## 2017-10-29 DIAGNOSIS — Z7982 Long term (current) use of aspirin: Secondary | ICD-10-CM | POA: Diagnosis not present

## 2017-10-29 DIAGNOSIS — Z471 Aftercare following joint replacement surgery: Secondary | ICD-10-CM | POA: Diagnosis not present

## 2017-10-29 DIAGNOSIS — Z96652 Presence of left artificial knee joint: Secondary | ICD-10-CM | POA: Diagnosis not present

## 2017-10-29 DIAGNOSIS — M1711 Unilateral primary osteoarthritis, right knee: Secondary | ICD-10-CM | POA: Diagnosis not present

## 2017-10-29 DIAGNOSIS — Z6835 Body mass index (BMI) 35.0-35.9, adult: Secondary | ICD-10-CM | POA: Diagnosis not present

## 2017-10-29 DIAGNOSIS — F329 Major depressive disorder, single episode, unspecified: Secondary | ICD-10-CM | POA: Diagnosis not present

## 2017-11-01 DIAGNOSIS — Z471 Aftercare following joint replacement surgery: Secondary | ICD-10-CM | POA: Diagnosis not present

## 2017-11-01 DIAGNOSIS — M25562 Pain in left knee: Secondary | ICD-10-CM | POA: Diagnosis not present

## 2017-11-01 DIAGNOSIS — M6281 Muscle weakness (generalized): Secondary | ICD-10-CM | POA: Diagnosis not present

## 2017-11-01 DIAGNOSIS — M1712 Unilateral primary osteoarthritis, left knee: Secondary | ICD-10-CM | POA: Diagnosis not present

## 2017-11-01 MED FILL — CYCLOBENZAPRINE 5 MG TABLET: 5 | 30 days supply | Qty: 30 | Fill #0

## 2017-11-01 MED FILL — GABAPENTIN 300 MG CAPSULE: 300 | 90 days supply | Qty: 90 | Fill #2

## 2017-11-03 DIAGNOSIS — M1712 Unilateral primary osteoarthritis, left knee: Secondary | ICD-10-CM | POA: Diagnosis not present

## 2017-11-03 DIAGNOSIS — M25562 Pain in left knee: Secondary | ICD-10-CM | POA: Diagnosis not present

## 2017-11-03 DIAGNOSIS — Z471 Aftercare following joint replacement surgery: Secondary | ICD-10-CM | POA: Diagnosis not present

## 2017-11-03 DIAGNOSIS — M6281 Muscle weakness (generalized): Secondary | ICD-10-CM | POA: Diagnosis not present

## 2017-11-08 DIAGNOSIS — M25562 Pain in left knee: Secondary | ICD-10-CM | POA: Diagnosis not present

## 2017-11-08 DIAGNOSIS — Z471 Aftercare following joint replacement surgery: Secondary | ICD-10-CM | POA: Diagnosis not present

## 2017-11-08 DIAGNOSIS — M1712 Unilateral primary osteoarthritis, left knee: Secondary | ICD-10-CM | POA: Diagnosis not present

## 2017-11-08 DIAGNOSIS — M6281 Muscle weakness (generalized): Secondary | ICD-10-CM | POA: Diagnosis not present

## 2017-11-10 DIAGNOSIS — M1712 Unilateral primary osteoarthritis, left knee: Secondary | ICD-10-CM | POA: Diagnosis not present

## 2017-11-10 DIAGNOSIS — Z471 Aftercare following joint replacement surgery: Secondary | ICD-10-CM | POA: Diagnosis not present

## 2017-11-10 DIAGNOSIS — M6281 Muscle weakness (generalized): Secondary | ICD-10-CM | POA: Diagnosis not present

## 2017-11-10 DIAGNOSIS — M25562 Pain in left knee: Secondary | ICD-10-CM | POA: Diagnosis not present

## 2017-11-17 DIAGNOSIS — M6281 Muscle weakness (generalized): Secondary | ICD-10-CM | POA: Diagnosis not present

## 2017-11-17 DIAGNOSIS — M1712 Unilateral primary osteoarthritis, left knee: Secondary | ICD-10-CM | POA: Diagnosis not present

## 2017-11-17 DIAGNOSIS — M25562 Pain in left knee: Secondary | ICD-10-CM | POA: Diagnosis not present

## 2017-11-17 DIAGNOSIS — Z471 Aftercare following joint replacement surgery: Secondary | ICD-10-CM | POA: Diagnosis not present

## 2017-11-19 DIAGNOSIS — M25562 Pain in left knee: Secondary | ICD-10-CM | POA: Diagnosis not present

## 2017-11-19 DIAGNOSIS — Z471 Aftercare following joint replacement surgery: Secondary | ICD-10-CM | POA: Diagnosis not present

## 2017-11-19 DIAGNOSIS — M1712 Unilateral primary osteoarthritis, left knee: Secondary | ICD-10-CM | POA: Diagnosis not present

## 2017-11-19 DIAGNOSIS — M6281 Muscle weakness (generalized): Secondary | ICD-10-CM | POA: Diagnosis not present

## 2017-11-22 ENCOUNTER — Ambulatory Visit: Payer: 59

## 2017-11-22 ENCOUNTER — Other Ambulatory Visit: Payer: Self-pay | Admitting: Family Medicine

## 2017-11-22 DIAGNOSIS — M1712 Unilateral primary osteoarthritis, left knee: Secondary | ICD-10-CM | POA: Diagnosis not present

## 2017-11-22 DIAGNOSIS — M25562 Pain in left knee: Secondary | ICD-10-CM | POA: Diagnosis not present

## 2017-11-22 DIAGNOSIS — Z471 Aftercare following joint replacement surgery: Secondary | ICD-10-CM | POA: Diagnosis not present

## 2017-11-22 DIAGNOSIS — M6281 Muscle weakness (generalized): Secondary | ICD-10-CM | POA: Diagnosis not present

## 2017-11-22 DIAGNOSIS — E559 Vitamin D deficiency, unspecified: Secondary | ICD-10-CM

## 2017-11-22 MED FILL — JANUMET XR 50-1,000 MG TAB: 50-1000 | 90 days supply | Qty: 180 | Fill #4

## 2017-11-22 MED FILL — VIT D2 1.25 MG (50,000 UNIT: 1.25 MG | 84 days supply | Qty: 12 | Fill #0

## 2017-11-22 MED FILL — glipiZIDE ER 10 MG TB24: 10 | 90 days supply | Qty: 90 | Fill #0

## 2017-11-22 MED FILL — glipiZIDE XL 5 MG TB24: 5 | 90 days supply | Qty: 90 | Fill #2

## 2017-11-23 ENCOUNTER — Encounter: Payer: Self-pay | Admitting: Dietician

## 2017-11-23 ENCOUNTER — Encounter: Payer: 59 | Attending: Family Medicine | Admitting: Dietician

## 2017-11-23 DIAGNOSIS — E669 Obesity, unspecified: Secondary | ICD-10-CM | POA: Insufficient documentation

## 2017-11-23 DIAGNOSIS — Z713 Dietary counseling and surveillance: Secondary | ICD-10-CM | POA: Insufficient documentation

## 2017-11-23 DIAGNOSIS — E1169 Type 2 diabetes mellitus with other specified complication: Secondary | ICD-10-CM | POA: Insufficient documentation

## 2017-11-23 NOTE — Progress Notes (Signed)
Patient was seen on 11/23/17 for the first of a series of three diabetes self-management courses [facility]  Patient Education Plan per assessed needs and concerns is to attend three course education program for Diabetes Self Management Education.  The following learning objectives were met by the patient during this class:  Describe diabetes  State some common risk factors for diabetes  Defines the role of glucose and insulin  Identifies type of diabetes and pathophysiology  Describe the relationship between diabetes and cardiovascular risk  State the members of the Healthcare Team  States the rationale for glucose monitoring  State when to test glucose  State their individual Target Range  State the importance of logging glucose readings  Describe how to interpret glucose readings  Identifies A1C target  Explain the correlation between A1c and eAG values  State symptoms and treatment of high blood glucose  State symptoms and treatment of low blood glucose  Explain proper technique for glucose testing  Identifies proper sharps disposal  Handouts given during class include:  ADA Diabetes You Take Control   Carb Counting and Meal Planning book  Meal Plan Card  Meal planning worksheet  Low Sodium Flavoring Tips  Types of Fats  The diabetes portion plate  V6P to eAG Conversion Chart  Diabetes Recommended Care Schedule  Support Group  Diabetes Success Plan  Core Class Satisfaction Survey   Follow-Up Plan:  Attend core 2

## 2017-11-24 DIAGNOSIS — Z471 Aftercare following joint replacement surgery: Secondary | ICD-10-CM | POA: Diagnosis not present

## 2017-11-24 DIAGNOSIS — M1712 Unilateral primary osteoarthritis, left knee: Secondary | ICD-10-CM | POA: Diagnosis not present

## 2017-11-24 DIAGNOSIS — M25562 Pain in left knee: Secondary | ICD-10-CM | POA: Diagnosis not present

## 2017-11-24 DIAGNOSIS — M6281 Muscle weakness (generalized): Secondary | ICD-10-CM | POA: Diagnosis not present

## 2017-11-25 ENCOUNTER — Telehealth: Payer: Self-pay | Admitting: Family Medicine

## 2017-11-25 ENCOUNTER — Encounter: Payer: Self-pay | Admitting: Family Medicine

## 2017-11-25 DIAGNOSIS — E1169 Type 2 diabetes mellitus with other specified complication: Secondary | ICD-10-CM

## 2017-11-25 DIAGNOSIS — E669 Obesity, unspecified: Principal | ICD-10-CM

## 2017-11-25 DIAGNOSIS — E785 Hyperlipidemia, unspecified: Secondary | ICD-10-CM

## 2017-11-25 DIAGNOSIS — E559 Vitamin D deficiency, unspecified: Secondary | ICD-10-CM

## 2017-11-25 NOTE — Telephone Encounter (Signed)
Please call pt , let her know thinking about her and hoping she is recovering very well from knee surgery Happy to see she is attending diabetic education classes hope blood sugars , try and get her fasting blood sugar number avg ( will also send a brief my chart msg. Needs HBA1C,  Microalb, fasting liid, cmp and eGFr to be scheduled to be drawn 9/17 or shortly after, also needs appt for end Sept please arrange this

## 2017-11-26 DIAGNOSIS — Z471 Aftercare following joint replacement surgery: Secondary | ICD-10-CM | POA: Diagnosis not present

## 2017-11-26 DIAGNOSIS — M25562 Pain in left knee: Secondary | ICD-10-CM | POA: Diagnosis not present

## 2017-11-26 DIAGNOSIS — M1712 Unilateral primary osteoarthritis, left knee: Secondary | ICD-10-CM | POA: Diagnosis not present

## 2017-11-26 DIAGNOSIS — M6281 Muscle weakness (generalized): Secondary | ICD-10-CM | POA: Diagnosis not present

## 2017-11-26 NOTE — Addendum Note (Signed)
Addended by: Abner GreenspanHUDY, Dariusz Brase H on: 11/26/2017 08:20 AM   Modules accepted: Orders

## 2017-11-29 DIAGNOSIS — M6281 Muscle weakness (generalized): Secondary | ICD-10-CM | POA: Diagnosis not present

## 2017-11-29 DIAGNOSIS — Z471 Aftercare following joint replacement surgery: Secondary | ICD-10-CM | POA: Diagnosis not present

## 2017-11-29 DIAGNOSIS — M25562 Pain in left knee: Secondary | ICD-10-CM | POA: Diagnosis not present

## 2017-11-29 DIAGNOSIS — M1712 Unilateral primary osteoarthritis, left knee: Secondary | ICD-10-CM | POA: Diagnosis not present

## 2017-11-30 ENCOUNTER — Encounter: Payer: Self-pay | Admitting: Dietician

## 2017-11-30 ENCOUNTER — Encounter: Payer: 59 | Admitting: Dietician

## 2017-11-30 DIAGNOSIS — Z713 Dietary counseling and surveillance: Secondary | ICD-10-CM | POA: Diagnosis not present

## 2017-11-30 DIAGNOSIS — E1169 Type 2 diabetes mellitus with other specified complication: Secondary | ICD-10-CM | POA: Diagnosis not present

## 2017-11-30 DIAGNOSIS — E669 Obesity, unspecified: Principal | ICD-10-CM

## 2017-11-30 NOTE — Telephone Encounter (Signed)
Left message that hope she was doing well since surgery and to call back to schedule appt and I will mail her labs to her to do before visit

## 2017-11-30 NOTE — Progress Notes (Signed)
Patient was seen on 11/30/17 for the second of a series of three diabetes self-management courses [facility]. The following learning objectives were met by the patient during this class:   Describe the role of different macronutrients on glucose  Explain how carbohydrates affect blood glucose  State what foods contain the most carbohydrates  Demonstrate carbohydrate counting  Demonstrate how to read Nutrition Facts food label  Describe effects of various fats on heart health  Describe the importance of good nutrition for health and healthy eating strategies  Describe techniques for managing your shopping, cooking and meal planning  List strategies to follow meal plan when dining out  Describe the effects of alcohol on glucose and how to use it safely  Goals:  Follow Diabetes Meal Plan as instructed  Aim to spread carbs evenly throughout the day  Aim for 3 meals per day and snacks as needed Include lean protein foods to meals/snacks  Monitor glucose levels as instructed by your doctor   Follow-Up Plan:  Attend Core 3  Work towards following your personal food plan.

## 2017-12-01 ENCOUNTER — Telehealth: Payer: Self-pay | Admitting: Family Medicine

## 2017-12-01 NOTE — Telephone Encounter (Signed)
Patient just wanted to let you know she is doing well after her surgery and will see you in September.  She does not need anything just wanted you to know.

## 2017-12-02 DIAGNOSIS — M1712 Unilateral primary osteoarthritis, left knee: Secondary | ICD-10-CM | POA: Diagnosis not present

## 2017-12-02 DIAGNOSIS — M25562 Pain in left knee: Secondary | ICD-10-CM | POA: Diagnosis not present

## 2017-12-02 DIAGNOSIS — Z471 Aftercare following joint replacement surgery: Secondary | ICD-10-CM | POA: Diagnosis not present

## 2017-12-02 DIAGNOSIS — M6281 Muscle weakness (generalized): Secondary | ICD-10-CM | POA: Diagnosis not present

## 2017-12-02 NOTE — Telephone Encounter (Signed)
Pt called back and wanted us to know she was doing fine and scheduled for sept

## 2017-12-06 DIAGNOSIS — M6281 Muscle weakness (generalized): Secondary | ICD-10-CM | POA: Diagnosis not present

## 2017-12-06 DIAGNOSIS — Z471 Aftercare following joint replacement surgery: Secondary | ICD-10-CM | POA: Diagnosis not present

## 2017-12-06 DIAGNOSIS — M1712 Unilateral primary osteoarthritis, left knee: Secondary | ICD-10-CM | POA: Diagnosis not present

## 2017-12-06 DIAGNOSIS — M25562 Pain in left knee: Secondary | ICD-10-CM | POA: Diagnosis not present

## 2017-12-07 ENCOUNTER — Encounter: Payer: Self-pay | Admitting: Dietician

## 2017-12-07 ENCOUNTER — Encounter: Payer: 59 | Attending: Family Medicine | Admitting: Dietician

## 2017-12-07 DIAGNOSIS — E669 Obesity, unspecified: Secondary | ICD-10-CM | POA: Insufficient documentation

## 2017-12-07 DIAGNOSIS — Z713 Dietary counseling and surveillance: Secondary | ICD-10-CM | POA: Insufficient documentation

## 2017-12-07 DIAGNOSIS — E1169 Type 2 diabetes mellitus with other specified complication: Secondary | ICD-10-CM | POA: Insufficient documentation

## 2017-12-07 NOTE — Progress Notes (Signed)
Patient was seen on 12/07/17 for the third of a series of three diabetes self-management courses [facility].   Janene Madeira. State the amount of activity recommended for healthy living . Describe activities suitable for individual needs . Identify ways to regularly incorporate activity into daily life . Identify barriers to activity and ways to over come these barriers  Identify diabetes medications being personally used and their primary action for lowering glucose and possible side effects . Describe role of stress on blood glucose and develop strategies to address psychosocial issues . Identify diabetes complications and ways to prevent them  Explain how to manage diabetes during illness . Evaluate success in meeting personal goal . Establish 2-3 goals that they will plan to diligently work on  Goals:   I will count my carb choices at most meals and snacks  I will be active 30 minutes or more 5 times a week  I will take my diabetes medications as scheduled  I will eat less unhealthy fats by eating less   I will test my glucose at least 1 times a day, 7 days a week  I will look at patterns in my record book at least 7 days a month  To help manage stress I will  walk at least 5 times a week  Your patient has identified these potential barriers to change:  Motivation  Your patient has identified their diabetes self-care support plan as  Family [employer] fast streamer[employer] Subscriptions On-Line resources    Plan:  Attend Support Group as desired

## 2017-12-09 DIAGNOSIS — M1712 Unilateral primary osteoarthritis, left knee: Secondary | ICD-10-CM | POA: Diagnosis not present

## 2017-12-09 DIAGNOSIS — M25562 Pain in left knee: Secondary | ICD-10-CM | POA: Diagnosis not present

## 2017-12-09 DIAGNOSIS — Z471 Aftercare following joint replacement surgery: Secondary | ICD-10-CM | POA: Diagnosis not present

## 2017-12-09 DIAGNOSIS — M6281 Muscle weakness (generalized): Secondary | ICD-10-CM | POA: Diagnosis not present

## 2017-12-13 DIAGNOSIS — Z471 Aftercare following joint replacement surgery: Secondary | ICD-10-CM | POA: Diagnosis not present

## 2017-12-13 DIAGNOSIS — M1712 Unilateral primary osteoarthritis, left knee: Secondary | ICD-10-CM | POA: Diagnosis not present

## 2017-12-13 DIAGNOSIS — M6281 Muscle weakness (generalized): Secondary | ICD-10-CM | POA: Diagnosis not present

## 2017-12-13 DIAGNOSIS — M25562 Pain in left knee: Secondary | ICD-10-CM | POA: Diagnosis not present

## 2017-12-14 ENCOUNTER — Ambulatory Visit
Admission: RE | Admit: 2017-12-14 | Discharge: 2017-12-14 | Disposition: A | Discharge status: Home / Self Care | Payer: 59 | Source: Ambulatory Visit | Attending: Family Medicine | Admitting: Family Medicine

## 2017-12-14 DIAGNOSIS — Z1231 Encounter for screening mammogram for malignant neoplasm of breast: Secondary | ICD-10-CM

## 2017-12-16 ENCOUNTER — Other Ambulatory Visit: Payer: Self-pay | Admitting: Family Medicine

## 2017-12-16 DIAGNOSIS — M1712 Unilateral primary osteoarthritis, left knee: Secondary | ICD-10-CM | POA: Diagnosis not present

## 2017-12-16 DIAGNOSIS — Z471 Aftercare following joint replacement surgery: Secondary | ICD-10-CM | POA: Diagnosis not present

## 2017-12-16 DIAGNOSIS — M25562 Pain in left knee: Secondary | ICD-10-CM | POA: Diagnosis not present

## 2017-12-16 DIAGNOSIS — M6281 Muscle weakness (generalized): Secondary | ICD-10-CM | POA: Diagnosis not present

## 2017-12-17 MED FILL — PRAVASTATIN NA 40 MG TAB: 40 | 90 days supply | Qty: 90 | Fill #0

## 2018-01-04 ENCOUNTER — Ambulatory Visit: Payer: 59 | Admitting: Family Medicine

## 2018-01-04 DIAGNOSIS — M1712 Unilateral primary osteoarthritis, left knee: Secondary | ICD-10-CM | POA: Diagnosis not present

## 2018-01-28 DIAGNOSIS — E1169 Type 2 diabetes mellitus with other specified complication: Secondary | ICD-10-CM | POA: Diagnosis not present

## 2018-01-28 DIAGNOSIS — E669 Obesity, unspecified: Secondary | ICD-10-CM | POA: Diagnosis not present

## 2018-01-28 DIAGNOSIS — E785 Hyperlipidemia, unspecified: Secondary | ICD-10-CM | POA: Diagnosis not present

## 2018-01-29 LAB — HEMOGLOBIN A1C
HEMOGLOBIN A1C: 8.8 %{Hb} — AB (ref <5.7)
MEAN PLASMA GLUCOSE: 206 (calc)
eAG (mmol/L): 11.4 (calc)

## 2018-01-29 LAB — COMPLETE METABOLIC PANEL WITH GFR
AG Ratio: 2 (calc) (ref 1.0–2.5)
ALT: 14 U/L (ref 6–29)
AST: 15 U/L (ref 10–35)
Albumin: 4.5 g/dL (ref 3.6–5.1)
Alkaline phosphatase (APISO): 65 U/L (ref 33–130)
BILIRUBIN TOTAL: 1 mg/dL (ref 0.2–1.2)
BUN: 16 mg/dL (ref 7–25)
CHLORIDE: 103 mmol/L (ref 98–110)
CO2: 28 mmol/L (ref 20–32)
Calcium: 9.4 mg/dL (ref 8.6–10.4)
Creat: 0.76 mg/dL (ref 0.50–0.99)
GFR, EST AFRICAN AMERICAN: 98 mL/min/{1.73_m2} (ref >=60)
GFR, Est Non African American: 85 mL/min/{1.73_m2} (ref >=60)
GLOBULIN: 2.2 g/dL (ref 1.9–3.7)
Glucose, Bld: 126 mg/dL — ABNORMAL HIGH (ref 65–99)
Potassium: 4.1 mmol/L (ref 3.5–5.3)
SODIUM: 140 mmol/L (ref 135–146)
Total Protein: 6.7 g/dL (ref 6.1–8.1)

## 2018-01-29 LAB — MICROALBUMIN / CREATININE URINE RATIO
CREATININE, URINE: 106 mg/dL (ref 20–275)
Microalb Creat Ratio: 10 mcg/mg creat (ref <30)
Microalb, Ur: 1.1 mg/dL

## 2018-01-29 LAB — LIPID PANEL
CHOL/HDL RATIO: 2.4 (calc) (ref <5.0)
CHOLESTEROL: 138 mg/dL (ref <200)
HDL: 58 mg/dL (ref >50)
LDL Cholesterol (Calc): 63 mg/dL (calc)
NON-HDL CHOLESTEROL (CALC): 80 mg/dL (ref <130)
TRIGLYCERIDES: 91 mg/dL (ref <150)

## 2018-01-30 ENCOUNTER — Encounter: Payer: Self-pay | Admitting: Family Medicine

## 2018-01-31 ENCOUNTER — Other Ambulatory Visit: Payer: Self-pay | Admitting: Family Medicine

## 2018-01-31 MED FILL — VIT D2 1.25 MG (50,000 UNIT: 1.25 MG | 84 days supply | Qty: 12 | Fill #1

## 2018-01-31 MED FILL — DICLOFENAC SOD EC 50 MG TAB: 50 | 30 days supply | Qty: 60 | Fill #1

## 2018-02-01 ENCOUNTER — Encounter: Payer: Self-pay | Admitting: Family Medicine

## 2018-02-01 ENCOUNTER — Other Ambulatory Visit: Payer: Self-pay

## 2018-02-01 ENCOUNTER — Ambulatory Visit (INDEPENDENT_AMBULATORY_CARE_PROVIDER_SITE_OTHER): Payer: 59 | Admitting: Family Medicine

## 2018-02-01 VITALS — BP 120/82 | HR 74 | Resp 12 | Ht 68.0 in | Wt 240.0 lb

## 2018-02-01 DIAGNOSIS — E785 Hyperlipidemia, unspecified: Secondary | ICD-10-CM

## 2018-02-01 DIAGNOSIS — E559 Vitamin D deficiency, unspecified: Secondary | ICD-10-CM | POA: Diagnosis not present

## 2018-02-01 DIAGNOSIS — I10 Essential (primary) hypertension: Secondary | ICD-10-CM

## 2018-02-01 DIAGNOSIS — E1159 Type 2 diabetes mellitus with other circulatory complications: Secondary | ICD-10-CM | POA: Diagnosis not present

## 2018-02-01 DIAGNOSIS — E1169 Type 2 diabetes mellitus with other specified complication: Secondary | ICD-10-CM | POA: Diagnosis not present

## 2018-02-01 DIAGNOSIS — E669 Obesity, unspecified: Secondary | ICD-10-CM | POA: Diagnosis not present

## 2018-02-01 MED ORDER — EMPAGLIFLOZIN 10 MG PO TABS: 10.0000 mg | ORAL_TABLET | Freq: Every day | ORAL | 3 refills | Status: DC

## 2018-02-01 MED FILL — JARDIANCE 10 MG TABLET: 10 | 90 days supply | Qty: 90 | Fill #0

## 2018-02-01 NOTE — Patient Instructions (Addendum)
F/U in 3 months,  Dec 31 or first week week in january call if you need me before  Happy  that pain uis improved and you are functional  Stop gabapentin  Test and record twice daily   Goal for fasting blood sugar ranges from 80 to 130 ;and 2 hours after any meal or at bedtime should be between 130 to 170.'  Add jardiance ( generic) one daily to blood sugar meds you have been on, and send  messages to me re blood sugar ranges for next 3 to 6 weeks, please   HBA1C, Vit D, chem 7 and EGFR 3 to 5 days before visit , not before 12/27 or after  Dietary mx and exercise commitment will make a big  difference   Thanks for choosing [facility], we consider it a privelige to serve you.

## 2018-02-06 ENCOUNTER — Encounter: Payer: Self-pay | Admitting: Family Medicine

## 2018-02-06 NOTE — Assessment & Plan Note (Signed)
Deteriorated. Patient re-educated about  the importance of commitment to a  minimum of 150 minutes of exercise per week.  The importance of healthy food choices with portion control discussed. Encouraged to start a food diary, count calories and to consider  joining a support group. Sample diet sheets offered. Goals set by the patient for the next several months.   Weight /BMI 02/01/2018 11/23/2017 10/18/2017  WEIGHT 240 lb 230 lb 236 lb  HEIGHT 5\' 8"  5\' 8"  -  BMI 36.49 kg/m2 34.97 kg/m2 34.85 kg/m2

## 2018-02-06 NOTE — Assessment & Plan Note (Signed)
Continue weekly supplement 

## 2018-02-06 NOTE — Assessment & Plan Note (Signed)
Kelly Savage is reminded of the importance of commitment to daily physical activity for 30 minutes or more, as able and the need to limit carbohydrate intake to 30 to 60 grams per meal to help with blood sugar control.   The need to take medication as prescribed, test blood sugar as directed, and to call between visits if there is a concern that blood sugar is uncontrolled is also discussed.   Kelly Savage is reminded of the importance of daily foot exam, annual eye examination, and good blood sugar, blood pressure and cholesterol control. Deteriorated and uncontrolled, add jardiance, increased diligence with diet and accountability  Diabetic Labs Latest Ref Rng & Units 01/28/2018 10/19/2017 10/18/2017 10/07/2017 09/13/2017  HbA1c <5.7 % of total Hgb 8.8(H) - 8.0(H) - 7.6(H)  Microalbumin mg/dL 1.1 - - - 0.4  Micro/Creat Ratio <30 mcg/mg creat 10 - - - 6  Chol <200 mg/dL 161 - - - 096  HDL >04 mg/dL 58 - - - 55  Calc LDL mg/dL (calc) 63 - - - 70  Triglycerides <150 mg/dL 91 - - - 79  Creatinine 0.50 - 0.99 mg/dL 5.40 9.81 - 1.91 4.78   BP/Weight 02/01/2018 11/23/2017 10/19/2017 10/18/2017 10/07/2017 09/20/2017 09/17/2017  Systolic BP 120 - 122 - 124 122 130  Diastolic BP 82 - 70 - 61 82 86  Wt. (Lbs) 240 230 - 236 236.4 235 236.4  BMI 36.49 34.97 - 34.85 34.91 35.73 35.94   Foot/eye exam completion dates Latest Ref Rng & Units 05/25/2017 04/15/2017  Eye Exam No Retinopathy No Retinopathy -  Foot exam Order - - -  Foot Form Completion - - Done

## 2018-02-06 NOTE — Assessment & Plan Note (Signed)
Hyperlipidemia:Low fat diet discussed and encouraged.   Lipid Panel  Lab Results  Component Value Date   CHOL 138 01/28/2018   HDL 58 01/28/2018   LDLCALC 63 01/28/2018   TRIG 91 01/28/2018   CHOLHDL 2.4 01/28/2018   Controlled, no change in medication

## 2018-02-06 NOTE — Progress Notes (Signed)
Kelly Savage     MRN: [id number]      DOB: 23-May-1955   HPI Kelly Savage is here for follow up and re-evaluation of chronic medical conditions, medication management and review of any available recent lab and radiology data.  Preventive health is updated, specifically  Cancer screening and Immunization.   Recently had left knee replacement and feels 200 % improved , much more able to safely ambulate and start the exercise she needs and wants to do. The PT denies any adverse reactions to current medications since the last visit.  Necessary lifestyle changes are inconsistent as is her blood sugar control, also requests starting additional blood sugar medication jardiance to improve blood sugar , sh e had good success with invokana, she is made aware of the low but still existent rare possibility of ischemia , and will be on the alert. Also plans to change lifestyle.She is toi send in blood sugar readings ROS Denies recent fever or chills. Denies sinus pressure, nasal congestion, ear pain or sore throat. Denies chest congestion, productive cough or wheezing. Denies chest pains, palpitations and leg swelling Denies abdominal pain, nausea, vomiting,diarrhea or constipation.   Denies dysuria, frequency, hesitancy or incontinence. Denies joint pain, swelling and limitation in mobility. Denies headaches, seizures, numbness, or tingling. Denies depression, anxiety or insomnia. Denies skin break down or rash.   PE  BP 120/82 (BP Location: Left Arm, Patient Position: Sitting, Cuff Size: Large)   Pulse 74   Resp 12   Ht 5\' 8"  (1.727 m)   Wt 240 lb (108.9 kg)   SpO2 99% Comment: room air  BMI 36.49 kg/m   Patient alert and oriented and in no cardiopulmonary distress.  HEENT: No facial asymmetry, EOMI,   oropharynx pink and moist.  Neck supple no JVD, no mass.  Chest: Clear to auscultation bilaterally.  CVS: S1, S2 no murmurs, no S3.Regular rate.  ABD: Soft non tender.   Ext: No  edema  MS: Adequate ROM spine, shoulders, hips and knees.  Skin: Intact, no ulcerations or rash noted.  Psych: Good eye contact, normal affect. Memory intact not anxious or depressed appearing.  CNS: CN 2-12 intact, power,  normal throughout.no focal deficits noted.   Assessment & Plan  Diabetes mellitus type 2 in obese [facility]) Kelly Savage is reminded of the importance of commitment to daily physical activity for 30 minutes or more, as able and the need to limit carbohydrate intake to 30 to 60 grams per meal to help with blood sugar control.   The need to take medication as prescribed, test blood sugar as directed, and to call between visits if there is a concern that blood sugar is uncontrolled is also discussed.   Kelly Savage is reminded of the importance of daily foot exam, annual eye examination, and good blood sugar, blood pressure and cholesterol control. Deteriorated and uncontrolled, add jardiance, increased diligence with diet and accountability  Diabetic Labs Latest Ref Rng & Units 01/28/2018 10/19/2017 10/18/2017 10/07/2017 09/13/2017  HbA1c <5.7 % of total Hgb 8.8(H) - 8.0(H) - 7.6(H)  Microalbumin mg/dL 1.1 - - - 0.4  Micro/Creat Ratio <30 mcg/mg creat 10 - - - 6  Chol <200 mg/dL 657 - - - 846  HDL >96 mg/dL 58 - - - 55  Calc LDL mg/dL (calc) 63 - - - 70  Triglycerides <150 mg/dL 91 - - - 79  Creatinine 0.50 - 0.99 mg/dL 2.95 2.84 - 1.32 4.40   BP/Weight 02/01/2018 11/23/2017 10/19/2017  10/18/2017 10/07/2017 09/20/2017 09/17/2017  Systolic BP 120 - 122 - 124 122 130  Diastolic BP 82 - 70 - 61 82 86  Wt. (Lbs) 240 230 - 236 236.4 235 236.4  BMI 36.49 34.97 - 34.85 34.91 35.73 35.94   Foot/eye exam completion dates Latest Ref Rng & Units 05/25/2017 04/15/2017  Eye Exam No Retinopathy No Retinopathy -  Foot exam Order - - -  Foot Form Completion - - Done        Hyperlipidemia with target LDL less than 100 Hyperlipidemia:Low fat diet discussed and encouraged.   Lipid Panel  Lab  Results  Component Value Date   CHOL 138 01/28/2018   HDL 58 01/28/2018   LDLCALC 63 01/28/2018   TRIG 91 01/28/2018   CHOLHDL 2.4 01/28/2018   Controlled, no change in medication     Morbid obesity (HCC) Deteriorated. Patient re-educated about  the importance of commitment to a  minimum of 150 minutes of exercise per week.  The importance of healthy food choices with portion control discussed. Encouraged to start a food diary, count calories and to consider  joining a support group. Sample diet sheets offered. Goals set by the patient for the next several months.   Weight /BMI 02/01/2018 11/23/2017 10/18/2017  WEIGHT 240 lb 230 lb 236 lb  HEIGHT 5\' 8"  5\' 8"  -  BMI 36.49 kg/m2 34.97 kg/m2 34.85 kg/m2      Vitamin D deficiency Continue weekly supplement

## 2018-02-23 MED FILL — glipiZIDE XL 5 MG TB24: 5 | 90 days supply | Qty: 90 | Fill #3

## 2018-02-23 MED FILL — glipiZIDE ER 10 MG TB24: 10 | 90 days supply | Qty: 90 | Fill #1

## 2018-03-10 ENCOUNTER — Other Ambulatory Visit: Payer: Self-pay | Admitting: Family Medicine

## 2018-03-11 MED FILL — JANUMET XR 50-1,000 MG TAB: 50-1000 | 90 days supply | Qty: 180 | Fill #0

## 2018-03-23 MED FILL — PRAVASTATIN NA 40 MG TAB: 40 | 90 days supply | Qty: 90 | Fill #1

## 2018-04-12 DIAGNOSIS — M1712 Unilateral primary osteoarthritis, left knee: Secondary | ICD-10-CM | POA: Diagnosis not present

## 2018-04-12 MED FILL — AMOXICILLIN 500 MG CAPSULE: 500 | 2 days supply | Qty: 8 | Fill #0

## 2018-04-29 MED FILL — DICLOFENAC SOD EC 50 MG TAB: 50 | 30 days supply | Qty: 60 | Fill #2

## 2018-04-29 MED FILL — VIT D2 1.25 MG (50,000 UNIT: 1.25 MG | 84 days supply | Qty: 12 | Fill #2

## 2018-05-11 ENCOUNTER — Ambulatory Visit: Payer: 59 | Admitting: Family Medicine

## 2018-05-12 MED FILL — JARDIANCE 10 MG TABLET: 10 | 90 days supply | Qty: 90 | Fill #1

## 2018-05-20 MED FILL — glipiZIDE ER 10 MG TB24: 10 | 90 days supply | Qty: 90 | Fill #2

## 2018-05-30 ENCOUNTER — Ambulatory Visit: Payer: 59 | Admitting: Family Medicine

## 2018-06-09 MED FILL — PRAVASTATIN NA 40 MG TAB: 40 | 90 days supply | Qty: 90 | Fill #2 | Status: TO

## 2018-06-13 MED FILL — JANUMET XR 50-1,000 MG TAB: 50-1000 | 90 days supply | Qty: 180 | Fill #1 | Status: TO

## 2018-06-15 ENCOUNTER — Ambulatory Visit: Payer: Self-pay | Admitting: Family Medicine

## 2018-06-16 MED FILL — AMOXICILLIN 500 MG CAPSULE: 500 | 5 days supply | Qty: 20 | Fill #0

## 2018-06-28 ENCOUNTER — Other Ambulatory Visit: Payer: Self-pay | Admitting: Family Medicine

## 2018-06-29 ENCOUNTER — Ambulatory Visit (INDEPENDENT_AMBULATORY_CARE_PROVIDER_SITE_OTHER): Payer: No Typology Code available for payment source | Admitting: Family Medicine

## 2018-06-29 ENCOUNTER — Encounter: Payer: Self-pay | Admitting: Family Medicine

## 2018-06-29 VITALS — BP 120/88 | HR 72 | Resp 12 | Ht 68.0 in | Wt 239.0 lb

## 2018-06-29 DIAGNOSIS — E1159 Type 2 diabetes mellitus with other circulatory complications: Secondary | ICD-10-CM

## 2018-06-29 DIAGNOSIS — E785 Hyperlipidemia, unspecified: Secondary | ICD-10-CM

## 2018-06-29 DIAGNOSIS — E1169 Type 2 diabetes mellitus with other specified complication: Secondary | ICD-10-CM

## 2018-06-29 DIAGNOSIS — M170 Bilateral primary osteoarthritis of knee: Secondary | ICD-10-CM

## 2018-06-29 DIAGNOSIS — E669 Obesity, unspecified: Secondary | ICD-10-CM

## 2018-06-29 LAB — BASIC METABOLIC PANEL
BUN/Creatinine Ratio: 14 (ref 12–28)
BUN: 11 mg/dL (ref 8–27)
CALCIUM: 9.5 mg/dL (ref 8.7–10.3)
CO2: 23 mmol/L (ref 20–29)
CREATININE: 0.78 mg/dL (ref 0.57–1.00)
Chloride: 102 mmol/L (ref 96–106)
GFR calc Af Amer: 94 mL/min/{1.73_m2} (ref >59)
GFR, EST NON AFRICAN AMERICAN: 82 mL/min/{1.73_m2} (ref >59)
Glucose: 116 mg/dL — ABNORMAL HIGH (ref 65–99)
Potassium: 4.1 mmol/L (ref 3.5–5.2)
SODIUM: 141 mmol/L (ref 134–144)

## 2018-06-29 LAB — VITAMIN D 25 HYDROXY (VIT D DEFICIENCY, FRACTURES): VIT D 25 HYDROXY: 51.5 ng/mL (ref 30.0–100.0)

## 2018-06-29 LAB — HGB A1C W/O EAG: Hgb A1c MFr Bld: 9.8 % — ABNORMAL HIGH (ref 4.8–5.6)

## 2018-06-29 MED ORDER — GLUCOSE BLOOD VI STRP: ORAL_STRIP | 11 refills | Status: DC

## 2018-06-29 MED ORDER — PRAVASTATIN SODIUM 40 MG PO TABS: 40.0000 mg | ORAL_TABLET | Freq: Every day | ORAL | 3 refills | Status: DC

## 2018-06-29 MED ORDER — SITAGLIP PHOS-METFORMIN HCL ER 50-1000 MG PO TB24: 1.0000 | ORAL_TABLET | Freq: Two times a day (BID) | ORAL | 99 refills | Status: DC

## 2018-06-29 MED ORDER — DICLOFENAC SODIUM 75 MG PO TBEC
DELAYED_RELEASE_TABLET | ORAL | 1 refills | Status: DC
Start: 2018-06-29 — End: 2018-09-27

## 2018-06-29 MED ORDER — SITAGLIP PHOS-METFORMIN HCL ER 50-500 MG PO TB24: ORAL_TABLET | ORAL | 3 refills | Status: DC

## 2018-06-29 MED ORDER — BENAZEPRIL HCL 5 MG PO TABS: 5.0000 mg | ORAL_TABLET | Freq: Every day | ORAL | 3 refills | Status: DC

## 2018-06-29 MED ORDER — GLUCOSE BLOOD VI STRP
ORAL_STRIP | 3 refills | Status: DC
Start: 2018-06-29 — End: 2020-10-10

## 2018-06-29 MED ORDER — GLIPIZIDE ER 10 MG PO TB24: ORAL_TABLET | ORAL | 3 refills | Status: DC

## 2018-06-29 MED FILL — BENAZEPRIL HCL 5 MG TABLET: 5 | 90 days supply | Qty: 90 | Fill #0

## 2018-06-29 MED FILL — DICLOFENAC SODIUM 75 MG TAB: 75 | 60 days supply | Qty: 60 | Fill #0

## 2018-06-29 MED FILL — FREESTYLE LITE TEST STRIP: 50 days supply | Qty: 100 | Fill #0 | Status: TO

## 2018-06-29 NOTE — Patient Instructions (Addendum)
Physical with pap last week in May, call if you need me sooner  Start testing two times daily     Goal for fasting blood sugar ranges from 80 to 130 and 2 hours after any meal or at bedtime should be between 130 to 180.  record blood sugar twice daily, fasting and at bedtime  Fax results every Friday  To my attention   Nurse will leave voice message on your cell  The following Monday re results ,and pls call back if questions, also hoping you can get back to my chart   Fasting lipid, cmp and EGFr, hBa1C, cBc and TSH 1 week before next visit

## 2018-06-30 ENCOUNTER — Encounter: Payer: Self-pay | Admitting: Family Medicine

## 2018-06-30 ENCOUNTER — Telehealth: Payer: Self-pay

## 2018-06-30 MED ORDER — FREESTYLE LANCETS MISC: 2 refills | Status: DC

## 2018-06-30 MED FILL — FREESTYLE LANCETS: 90 days supply | Qty: 100 | Fill #0 | Status: TO

## 2018-06-30 NOTE — Assessment & Plan Note (Signed)
Unchanged, obesity linked with diabetes, osteoarthritis, hyperlipidemia Patient re-educated about  the importance of commitment to a  minimum of 150 minutes of exercise per week as able.  The importance of healthy food choices with portion control discussed, as well as eating regularly and within a 12 hour window most days. The need to choose "clean , green" food 50 to 75% of the time is discussed, as well as to make water the primary drink and set a goal of 64 ounces water daily.  Encouraged to start a food diary,  and to consider  joining a support group. Sample diet sheets offered. Goals set by the patient for the next several months.   Weight /BMI 06/29/2018 02/01/2018 11/23/2017  WEIGHT 239 lb 240 lb 230 lb  HEIGHT 5\' 8"  5\' 8"  5\' 8"   BMI 36.34 kg/m2 36.49 kg/m2 34.97 kg/m2

## 2018-06-30 NOTE — Assessment & Plan Note (Signed)
Deteriorated Increase glipizide to 20 mg daily Needs to ebngage in daily testing and weekly log in.ex1 Ms. Harn is reminded of the importance of commitment to daily physical activity for 30 minutes or more, as able and the need to limit carbohydrate intake to 30 to 60 grams per meal to help with blood sugar control.   The need to take medication as prescribed, test blood sugar as directed, and to call between visits if there is a concern that blood sugar is uncontrolled is also discussed.   Ms. Lamberty is reminded of the importance of daily foot exam, annual eye examination, and good blood sugar, blood pressure and cholesterol control.  Diabetic Labs Latest Ref Rng & Units 06/28/2018 01/28/2018 10/19/2017 10/18/2017 10/07/2017  HbA1c 4.8 - 5.6 % 9.8(H) 8.8(H) - 8.0(H) -  Microalbumin mg/dL - 1.1 - - -  Micro/Creat Ratio <30 mcg/mg creat - 10 - - -  Chol <200 mg/dL - 820 - - -  HDL >60 mg/dL - 58 - - -  Calc LDL mg/dL (calc) - 63 - - -  Triglycerides <150 mg/dL - 91 - - -  Creatinine 0.57 - 1.00 mg/dL 1.56 1.53 7.94 - 3.27   BP/Weight 06/29/2018 02/01/2018 11/23/2017 10/19/2017 10/18/2017 10/07/2017 09/20/2017  Systolic BP 120 120 - 122 - 124 122  Diastolic BP 88 82 - 70 - 61 82  Wt. (Lbs) 239 240 230 - 236 236.4 235  BMI 36.34 36.49 34.97 - 34.85 34.91 35.73   Foot/eye exam completion dates Latest Ref Rng & Units 05/25/2017 04/15/2017  Eye Exam No Retinopathy No Retinopathy -  Foot exam Order - - -  Foot Form Completion - - Done      Updated lab needed in 3 months

## 2018-06-30 NOTE — Progress Notes (Signed)
Kelly Savage     MRN: [id number]      DOB: 24-Dec-1955   HPI [name] is here for follow up and re-evaluation of chronic medical conditions, medication management and review of any available recent lab and radiology data.  Preventive health is updated, specifically  Cancer screening and Immunization.   Requests referral for eye exam and orthopedics, doing very well as far as her left  knee replacement surgery result is concerned, was told the other needs replacement but she is in no hurry. Extremely stressed by her job which she loves, but her work hrs and responsibilities are worsening and she has not been expending the energy and time needed to adequately care fofr herself and normalize her blood sugar, unfortunately it has worsened. She realizes that she does have to make some lifestyle changes for her health and her personal life The PT denies any adverse reactions to current medications since the last visit.  ROS Denies recent fever or chills. Denies sinus pressure, nasal congestion, ear pain or sore throat. Denies chest congestion, productive cough or wheezing. Denies chest pains, palpitations and leg swelling Denies abdominal pain, nausea, vomiting,diarrhea or constipation.   Denies dysuria, frequency, hesitancy or incontinence.  Denies headaches, seizures, numbness, or tingling. Denies depression,c/o excessive work stress, insufficient hrs in the day with extremely long days, denies  anxiety or insomnia. Denies skin break down or rash.   PE  BP 120/88   Pulse 72   Resp 12   Ht 5\' 8"  (1.727 m)   Wt 239 lb (108.4 kg)   SpO2 96% Comment: room air  BMI 36.34 kg/m   Patient alert and oriented and in no cardiopulmonary distress.  HEENT: No facial asymmetry, EOMI,   oropharynx pink and moist.  Neck supple no JVD, no mass.  Chest: Clear to auscultation bilaterally.  CVS: S1, S2 no murmurs, no S3.Regular rate.  ABD: Soft non tender.   Ext: No edema  MS: Adequate ROM  spine, shoulders, hips and reduced in  knees.  Skin: Intact, no ulcerations or rash noted.  Psych: Good eye contact, normal affect. Memory intact not anxious or depressed appearing.  CNS: CN 2-12 intact, power,  normal throughout.no focal deficits noted.   Assessment & Plan  Type 2 diabetes mellitus with vascular disease (HCC) Deteriorated Increase glipizide to 20 mg daily Needs to ebngage in daily testing and weekly log in.ex1 [name] is reminded of the importance of commitment to daily physical activity for 30 minutes or more, as able and the need to limit carbohydrate intake to 30 to 60 grams per meal to help with blood sugar control.   The need to take medication as prescribed, test blood sugar as directed, and to call between visits if there is a concern that blood sugar is uncontrolled is also discussed.   Ms. Foley is reminded of the importance of daily foot exam, annual eye examination, and good blood sugar, blood pressure and cholesterol control.  Diabetic Labs Latest Ref Rng & Units 06/28/2018 01/28/2018 10/19/2017 10/18/2017 10/07/2017  HbA1c 4.8 - 5.6 % 9.8(H) 8.8(H) - 8.0(H) -  Microalbumin mg/dL - 1.1 - - -  Micro/Creat Ratio <30 mcg/mg creat - 10 - - -  Chol <200 mg/dL - 299 - - -  HDL >37 mg/dL - 58 - - -  Calc LDL mg/dL (calc) - 63 - - -  Triglycerides <150 mg/dL - 91 - - -  Creatinine 0.57 - 1.00 mg/dL 1.69 6.78 9.38 -  0.80   BP/Weight 06/29/2018 02/01/2018 11/23/2017 10/19/2017 10/18/2017 10/07/2017 09/20/2017  Systolic BP 120 120 - 122 - 124 122  Diastolic BP 88 82 - 70 - 61 82  Wt. (Lbs) 239 240 230 - 236 236.4 235  BMI 36.34 36.49 34.97 - 34.85 34.91 35.73   Foot/eye exam completion dates Latest Ref Rng & Units 05/25/2017 04/15/2017  Eye Exam No Retinopathy No Retinopathy -  Foot exam Order - - -  Foot Form Completion - - Done      Updated lab needed in 3 months  Morbid obesity (HCC) Unchanged, obesity linked with diabetes, osteoarthritis,  hyperlipidemia Patient re-educated about  the importance of commitment to a  minimum of 150 minutes of exercise per week as able.  The importance of healthy food choices with portion control discussed, as well as eating regularly and within a 12 hour window most days. The need to choose "clean , green" food 50 to 75% of the time is discussed, as well as to make water the primary drink and set a goal of 64 ounces water daily.  Encouraged to start a food diary,  and to consider  joining a support group. Sample diet sheets offered. Goals set by the patient for the next several months.   Weight /BMI 06/29/2018 02/01/2018 11/23/2017  WEIGHT 239 lb 240 lb 230 lb  HEIGHT 5\' 8"  5\' 8"  5\' 8"   BMI 36.34 kg/m2 36.49 kg/m2 34.97 kg/m2       Bilateral primary osteoarthritis of knee Increased right knee pain , however , no interest in surgery now, needs f/u of left knee replacement surgery, will refer

## 2018-06-30 NOTE — Assessment & Plan Note (Signed)
Increased right knee pain , however , no interest in surgery now, needs f/u of left knee replacement surgery, will refer

## 2018-06-30 NOTE — Telephone Encounter (Signed)
Freestyle lancets sent in for patient

## 2018-07-11 ENCOUNTER — Other Ambulatory Visit: Payer: Self-pay | Admitting: Family Medicine

## 2018-07-11 ENCOUNTER — Telehealth: Payer: Self-pay | Admitting: Family Medicine

## 2018-07-11 NOTE — Telephone Encounter (Signed)
Spoke with patient.

## 2018-07-11 NOTE — Telephone Encounter (Signed)
Sent via mychart to see if she is continuing to use it since our teaching session on it

## 2018-07-11 NOTE — Telephone Encounter (Signed)
pls let her know I  Have reviewed her log. Pls try to encourage her to get bedtime blood sugar between 130- to 170, and as she sees her morning sugar can be in range too  [place] job, continue two times daily testing and recording  More vegetables, water and movement will let the numbers continue to come down! Sheet is in your box then for scanning.

## 2018-07-22 MED FILL — glipiZIDE ER 10 MG TB24: 10 | 90 days supply | Qty: 90 | Fill #3

## 2018-07-25 ENCOUNTER — Telehealth: Payer: Self-pay | Admitting: Family Medicine

## 2018-07-25 MED FILL — VIT D2 1.25 MG (50,000 UNIT: 1.25 MG | 84 days supply | Qty: 12 | Fill #0

## 2018-07-25 NOTE — Telephone Encounter (Signed)
pls call pt , let her know I see her results. Yes , times a re stressful!!!  Keep up the NECESSARY great job of testing and recording twice daily and sending over  Surround yourself with healthy food options, fruit and vegetable over candy, cookie , cakes and pies, and only water or no calorie drinks  Start daily exercise , help with de stress, 2 to 3 tem minute session per day  Looking forward to next week's results, be safe and be careful

## 2018-07-25 NOTE — Telephone Encounter (Signed)
Spoke with patient and advised of Dr.Simpsons plan with verbal understanding.

## 2018-07-26 ENCOUNTER — Other Ambulatory Visit: Payer: Self-pay | Admitting: Family Medicine

## 2018-07-26 DIAGNOSIS — E559 Vitamin D deficiency, unspecified: Secondary | ICD-10-CM

## 2018-08-08 MED FILL — JARDIANCE 10 MG TABLET: 10 | 90 days supply | Qty: 90 | Fill #0

## 2018-08-16 ENCOUNTER — Encounter: Payer: Self-pay | Admitting: Family Medicine

## 2018-08-16 ENCOUNTER — Telehealth: Payer: Self-pay | Admitting: Family Medicine

## 2018-08-16 NOTE — Telephone Encounter (Signed)
I spoke directly with pt and she wants to continue oral  Agents will increase exercise and and vegetables., recjheck in 1 week

## 2018-08-25 MED FILL — FREESTYLE LITE TEST STRIP: 50 days supply | Qty: 100 | Fill #0

## 2018-08-25 MED FILL — FREESTYLE LANCETS: 90 days supply | Qty: 100 | Fill #0

## 2018-09-01 MED FILL — PRAVASTATIN NA 40 MG TAB: 40 | 90 days supply | Qty: 90 | Fill #0

## 2018-09-01 MED FILL — JANUMET XR 50-1,000 MG TAB: 50-1000 | 90 days supply | Qty: 180 | Fill #0

## 2018-09-01 MED FILL — glipiZIDE ER 10 MG TB24: 10 | 90 days supply | Qty: 180 | Fill #0

## 2018-09-21 ENCOUNTER — Encounter: Payer: No Typology Code available for payment source | Admitting: Family Medicine

## 2018-09-21 ENCOUNTER — Telehealth: Payer: Self-pay

## 2018-09-21 DIAGNOSIS — E1159 Type 2 diabetes mellitus with other circulatory complications: Secondary | ICD-10-CM

## 2018-09-21 DIAGNOSIS — E559 Vitamin D deficiency, unspecified: Secondary | ICD-10-CM

## 2018-09-21 DIAGNOSIS — I152 Hypertension secondary to endocrine disorders: Secondary | ICD-10-CM

## 2018-09-21 DIAGNOSIS — E785 Hyperlipidemia, unspecified: Secondary | ICD-10-CM

## 2018-09-21 NOTE — Telephone Encounter (Signed)
Labs re-entered for [facility] per patient request

## 2018-09-22 LAB — CBC
Hematocrit: 41.1 % (ref 34.0–46.6)
Hemoglobin: 13.7 g/dL (ref 11.1–15.9)
MCH: 30 pg (ref 26.6–33.0)
MCHC: 33.3 g/dL (ref 31.5–35.7)
MCV: 90 fL (ref 79–97)
Platelets: 192 10*3/uL (ref 150–450)
RBC: 4.56 x10E6/uL (ref 3.77–5.28)
RDW: 14.2 % (ref 11.7–15.4)
WBC: 5.1 10*3/uL (ref 3.4–10.8)

## 2018-09-22 LAB — CMP14+EGFR
ALT: 18 IU/L (ref 0–32)
AST: 17 IU/L (ref 0–40)
Albumin/Globulin Ratio: 2 (ref 1.2–2.2)
Albumin: 4.5 g/dL (ref 3.8–4.8)
Alkaline Phosphatase: 64 IU/L (ref 39–117)
BUN/Creatinine Ratio: 22 (ref 12–28)
BUN: 18 mg/dL (ref 8–27)
Bilirubin Total: 0.8 mg/dL (ref 0.0–1.2)
CO2: 22 mmol/L (ref 20–29)
Calcium: 9.2 mg/dL (ref 8.7–10.3)
Chloride: 103 mmol/L (ref 96–106)
Creatinine, Ser: 0.83 mg/dL (ref 0.57–1.00)
GFR calc Af Amer: 87 mL/min/{1.73_m2} (ref >59)
GFR calc non Af Amer: 76 mL/min/{1.73_m2} (ref >59)
Globulin, Total: 2.3 g/dL (ref 1.5–4.5)
Glucose: 137 mg/dL — ABNORMAL HIGH (ref 65–99)
Potassium: 4.7 mmol/L (ref 3.5–5.2)
Sodium: 140 mmol/L (ref 134–144)
Total Protein: 6.8 g/dL (ref 6.0–8.5)

## 2018-09-22 LAB — LIPID PANEL
Chol/HDL Ratio: 2.7 ratio (ref 0.0–4.4)
Cholesterol, Total: 130 mg/dL (ref 100–199)
HDL: 48 mg/dL (ref >39)
LDL Calculated: 65 mg/dL (ref 0–99)
Triglycerides: 84 mg/dL (ref 0–149)
VLDL Cholesterol Cal: 17 mg/dL (ref 5–40)

## 2018-09-22 LAB — HEMOGLOBIN A1C
Est. average glucose Bld gHb Est-mCnc: 197 mg/dL
Hgb A1c MFr Bld: 8.5 % — ABNORMAL HIGH (ref 4.8–5.6)

## 2018-09-22 LAB — TSH: TSH: 1.51 u[IU]/mL (ref 0.450–4.500)

## 2018-09-23 NOTE — Telephone Encounter (Signed)
Betsy aware to schedule pt for phone visit next week

## 2018-09-27 ENCOUNTER — Ambulatory Visit (INDEPENDENT_AMBULATORY_CARE_PROVIDER_SITE_OTHER): Payer: No Typology Code available for payment source | Admitting: Family Medicine

## 2018-09-27 ENCOUNTER — Encounter: Payer: Self-pay | Admitting: Family Medicine

## 2018-09-27 ENCOUNTER — Other Ambulatory Visit: Payer: Self-pay

## 2018-09-27 VITALS — BP 120/68 | HR 82 | Ht 68.0 in | Wt 235.0 lb

## 2018-09-27 DIAGNOSIS — Z7189 Other specified counseling: Secondary | ICD-10-CM | POA: Diagnosis not present

## 2018-09-27 DIAGNOSIS — E1165 Type 2 diabetes mellitus with hyperglycemia: Secondary | ICD-10-CM | POA: Diagnosis not present

## 2018-09-27 DIAGNOSIS — E785 Hyperlipidemia, unspecified: Secondary | ICD-10-CM | POA: Diagnosis not present

## 2018-09-27 DIAGNOSIS — Z1231 Encounter for screening mammogram for malignant neoplasm of breast: Secondary | ICD-10-CM

## 2018-09-27 MED ORDER — DICLOFENAC SODIUM 75 MG PO TBEC: DELAYED_RELEASE_TABLET | ORAL | 3 refills | Status: DC

## 2018-09-27 MED ORDER — PHENTERMINE HCL 37.5 MG PO TABS: ORAL_TABLET | ORAL | 1 refills | Status: DC

## 2018-09-27 MED FILL — PHENTERMINE 37.5 MG TABLET: 37.5 | 60 days supply | Qty: 30 | Fill #0

## 2018-09-27 MED FILL — DICLOFENAC SODIUM 75 MG TAB: 75 | 60 days supply | Qty: 60 | Fill #0

## 2018-09-27 NOTE — Assessment & Plan Note (Signed)
Kelly Savage is reminded of the importance of commitment to daily physical activity for 30 minutes or more, as able and the need to limit carbohydrate intake to 30 to 60 grams per meal to help with blood sugar control.   The need to take medication as prescribed, test blood sugar as directed, and to call between visits if there is a concern that blood sugar is uncontrolled is also discussed.   Kelly Savage is reminded of the importance of daily foot exam, annual eye examination, and good blood sugar, blood pressure and cholesterol control. Improved , needs to control eating more, request appetite suppressant to assist and will also increase exercise commitment   Diabetic Labs Latest Ref Rng & Units 09/21/2018 06/28/2018 01/28/2018 10/19/2017 10/18/2017  HbA1c 4.8 - 5.6 % 8.5(H) 9.8(H) 8.8(H) - 8.0(H)  Microalbumin mg/dL - - 1.1 - -  Micro/Creat Ratio <30 mcg/mg creat - - 10 - -  Chol 100 - 199 mg/dL 833 - 383 - -  HDL >29 mg/dL 48 - 58 - -  Calc LDL 0 - 99 mg/dL 65 - 63 - -  Triglycerides 0 - 149 mg/dL 84 - 91 - -  Creatinine 0.57 - 1.00 mg/dL 1.91 6.60 6.00 4.59 -   BP/Weight 09/27/2018 06/29/2018 02/01/2018 11/23/2017 10/19/2017 10/18/2017 10/07/2017  Systolic BP 120 120 120 - 122 - 124  Diastolic BP 68 88 82 - 70 - 61  Wt. (Lbs) 235 239 240 230 - 236 236.4  BMI 35.73 36.34 36.49 34.97 - 34.85 34.91   Foot/eye exam completion dates Latest Ref Rng & Units 05/25/2017 04/15/2017  Eye Exam No Retinopathy No Retinopathy -  Foot exam Order - - -  Foot Form Completion - - Done

## 2018-09-27 NOTE — Assessment & Plan Note (Signed)
Covid-19 Education  The signs and symptoms of of COVID -19 were discussed with the patient and how to seek care for testing. ( follow up with PCP or arrange  E-visit) The importance of social  distancing is discussed today.  

## 2018-09-27 NOTE — Progress Notes (Signed)
Virtual Visit via Telephone Note  I connected with Kelly Savage on 09/27/18 at  8:20 AM EDT by telephone and verified that I am speaking with the correct person using two identifiers.  Location: Patient:home Provider: office   I discussed the limitations, risks, security and privacy concerns of performing an evaluation and management service by telephone and the availability of in person appointments. I also discussed with the patient that there may be a patient responsible charge related to this service. The patient expressed understanding and agreed to proceed.   History of Present Illness:   f/U chronic problems and uncontrolled diaBetes I am stressed with the virus and a work, not exercising as regularly as I would like, also eating gets off whack sometimes DP  Denies recent fever or chills. Denies sinus pressure, nasal congestion, ear pain or sore throat. Denies chest congestion, productive cough or wheezing. Denies chest pains, palpitations and leg swelling Denies abdominal pain, nausea, vomiting,diarrhea or constipation.   Denies dysuria, frequency, hesitancy or incontinence. Denies UNCONTROLLED  joint pain, swelling and limitation in mobility. Denies headaches, seizures, numbness, or tingling. Denies depression, c/o  anxiety denies  insomnia. Denies skin break down or rash.     Observations/Objective: BP 120/68   Pulse 82   Ht 5\' 8"  (1.727 m)   Wt 235 lb (106.6 kg)   BMI 35.73 kg/m  Good communication with no confusion and intact memory. Alert and oriented x 3 No signs of respiratory distress during sppech    Assessment and Plan: Hyperlipidemia with target LDL less than 100 Hyperlipidemia:Low fat diet discussed and encouraged.   Lipid Panel  Lab Results  Component Value Date   CHOL 130 09/21/2018   HDL 48 09/21/2018   LDLCALC 65 09/21/2018   TRIG 84 09/21/2018   CHOLHDL 2.7 09/21/2018   Controlled, no change in medication     Type 2 diabetes  mellitus with vascular disease (HCC) [name] is reminded of the importance of commitment to daily physical activity for 30 minutes or more, as able and the need to limit carbohydrate intake to 30 to 60 grams per meal to help with blood sugar control.   The need to take medication as prescribed, test blood sugar as directed, and to call between visits if there is a concern that blood sugar is uncontrolled is also discussed.   [name] is reminded of the importance of daily foot exam, annual eye examination, and good blood sugar, blood pressure and cholesterol control. Improved , needs to control eating more, request appetite suppressant to assist and will also increase exercise commitment   Diabetic Labs Latest Ref Rng & Units 09/21/2018 06/28/2018 01/28/2018 10/19/2017 10/18/2017  HbA1c 4.8 - 5.6 % 8.5(H) 9.8(H) 8.8(H) - 8.0(H)  Microalbumin mg/dL - - 1.1 - -  Micro/Creat Ratio <30 mcg/mg creat - - 10 - -  Chol 100 - 199 mg/dL 213 - 086 - -  HDL >57 mg/dL 48 - 58 - -  Calc LDL 0 - 99 mg/dL 65 - 63 - -  Triglycerides 0 - 149 mg/dL 84 - 91 - -  Creatinine 0.57 - 1.00 mg/dL 8.46 9.62 9.52 8.41 -   BP/Weight 09/27/2018 06/29/2018 02/01/2018 11/23/2017 10/19/2017 10/18/2017 10/07/2017  Systolic BP 120 120 120 - 122 - 124  Diastolic BP 68 88 82 - 70 - 61  Wt. (Lbs) 235 239 240 230 - 236 236.4  BMI 35.73 36.34 36.49 34.97 - 34.85 34.91   Foot/eye exam completion dates Latest  Ref Rng & Units 05/25/2017 04/15/2017  Eye Exam No Retinopathy No Retinopathy -  Foot exam Order - - -  Foot Form Completion - - Done        Morbid obesity (HCC) Obesity linked with diabetes and arthritis Unchanged Patient re-educated about  the importance of commitment to a  minimum of 150 minutes of exercise per week as able.  The importance of healthy food choices with portion control discussed, as well as eating regularly and within a 12 hour window most days. The need to choose "clean , green" food 50 to 75% of the time  is discussed, as well as to make water the primary drink and set a goal of 64 ounces water daily.  Encouraged to start a food diary,  and to consider  joining a support group. Sample diet sheets offered. Goals set by the patient for the next several months.   Weight /BMI 09/27/2018 06/29/2018 02/01/2018  WEIGHT 235 lb 239 lb 240 lb  HEIGHT 5\' 8"  5\' 8"  5\' 8"   BMI 35.73 kg/m2 36.34 kg/m2 36.49 kg/m2      Educated About Covid-19 Virus Infection Covid-19 Education  The signs and symptoms of of COVID -19 were discussed with the patient and how to seek care for testing. ( follow up with PCP or arrange  E-visit) The importance of social  distancing is discussed today.        Follow Up Instructions:    I discussed the assessment and treatment plan with the patient. The patient was provided an opportunity to ask questions and all were answered. The patient agreed with the plan and demonstrated an understanding of the instructions.   The patient was advised to call back or seek an in-person evaluation if the symptoms worsen or if the condition fails to improve as anticipated.  I provided 25 minutes of non-face-to-face time during this encounter.   Kelly Savage , MD

## 2018-09-27 NOTE — Patient Instructions (Addendum)
Physical exam with pap in 3.5 months, call if you need me sooner  Mammogram due end August and is [facility]  Congrats on improved blood sugar, next 3 months will get you  To goal , please continue to work on food choice and exercise  New is phentermine HALF tablet every morning to help with appetite  After you finish once weekly vit D that you have start OTC vit D3 800 to 1000 IU once daily  We will send in increased test supplies to twice daily since your blood sugar is uncontrolled still  Goal  For fasting blood sugar is 90 to 130, and bedtime is 130 to 170  Microalb, hBA1C, chem 7 and eGFR 1 week before next visit , non fasting  It is important that you exercise regularly at least 30 minutes 5 times a week. If you develop chest pain, have severe difficulty breathing, or feel very tired, stop exercising immediately and seek medical attention . Get support of your best cheerleaders, your family  Social distancing.Maintain a 6 ft distance Frequent hand washing with soap and water Keeping your hands off of your face.Wear a face mask These 3 practices will help to keep both you and your community healthy during this time. Please practice them faithfully!    Reach out for help with stress and anxiety , when needed

## 2018-09-27 NOTE — Assessment & Plan Note (Signed)
Hyperlipidemia:Low fat diet discussed and encouraged.   Lipid Panel  Lab Results  Component Value Date   CHOL 130 09/21/2018   HDL 48 09/21/2018   LDLCALC 65 09/21/2018   TRIG 84 09/21/2018   CHOLHDL 2.7 09/21/2018   Controlled, no change in medication

## 2018-09-27 NOTE — Assessment & Plan Note (Signed)
Obesity linked with diabetes and arthritis Unchanged Patient re-educated about  the importance of commitment to a  minimum of 150 minutes of exercise per week as able.  The importance of healthy food choices with portion control discussed, as well as eating regularly and within a 12 hour window most days. The need to choose "clean , green" food 50 to 75% of the time is discussed, as well as to make water the primary drink and set a goal of 64 ounces water daily.  Encouraged to start a food diary,  and to consider  joining a support group. Sample diet sheets offered. Goals set by the patient for the next several months.   Weight /BMI 09/27/2018 06/29/2018 02/01/2018  WEIGHT 235 lb 239 lb 240 lb  HEIGHT 5\' 8"  5\' 8"  5\' 8"   BMI 35.73 kg/m2 36.34 kg/m2 36.49 kg/m2

## 2018-09-29 ENCOUNTER — Encounter: Payer: Self-pay | Admitting: Family Medicine

## 2018-09-29 ENCOUNTER — Other Ambulatory Visit: Payer: Self-pay | Admitting: Family Medicine

## 2018-09-29 MED ORDER — PHENTERMINE HCL 37.5 MG PO TABS: ORAL_TABLET | ORAL | 1 refills | Status: DC

## 2018-10-03 ENCOUNTER — Encounter: Payer: Self-pay | Admitting: Family Medicine

## 2018-10-12 ENCOUNTER — Encounter: Payer: No Typology Code available for payment source | Admitting: Family Medicine

## 2018-11-01 MED FILL — JARDIANCE 10 MG TABLET: 10 | 90 days supply | Qty: 90 | Fill #0

## 2018-11-01 MED FILL — FREESTYLE LANCETS: 50 days supply | Qty: 100 | Fill #0

## 2018-11-01 MED FILL — FREESTYLE LITE TEST STRIP: 50 days supply | Qty: 100 | Fill #0

## 2018-11-25 MED FILL — glipiZIDE ER 10 MG TB24: 10 | 90 days supply | Qty: 180 | Fill #0

## 2018-11-25 MED FILL — PRAVASTATIN NA 40 MG TAB: 40 | 90 days supply | Qty: 90 | Fill #0

## 2018-11-25 MED FILL — DICLOFENAC SODIUM 75 MG TAB: 75 | 60 days supply | Qty: 60 | Fill #1

## 2018-12-12 MED FILL — JANUMET XR 50-1,000 MG TAB: 50-1000 | 60 days supply | Qty: 120 | Fill #0

## 2018-12-15 ENCOUNTER — Other Ambulatory Visit: Payer: Self-pay

## 2018-12-15 ENCOUNTER — Ambulatory Visit
Admission: RE | Admit: 2018-12-15 | Discharge: 2018-12-15 | Disposition: A | Discharge status: Home / Self Care | Payer: No Typology Code available for payment source | Source: Ambulatory Visit | Attending: Family Medicine | Admitting: Family Medicine

## 2018-12-15 DIAGNOSIS — Z1231 Encounter for screening mammogram for malignant neoplasm of breast: Secondary | ICD-10-CM

## 2018-12-16 ENCOUNTER — Encounter: Payer: Self-pay | Admitting: Family Medicine

## 2018-12-19 ENCOUNTER — Ambulatory Visit: Payer: No Typology Code available for payment source

## 2018-12-19 ENCOUNTER — Ambulatory Visit (HOSPITAL_COMMUNITY): Payer: No Typology Code available for payment source

## 2018-12-20 ENCOUNTER — Telehealth: Payer: Self-pay

## 2018-12-20 DIAGNOSIS — E1159 Type 2 diabetes mellitus with other circulatory complications: Secondary | ICD-10-CM

## 2018-12-20 NOTE — Telephone Encounter (Signed)
pts labs ordered

## 2019-01-25 ENCOUNTER — Encounter: Payer: Self-pay | Admitting: Family Medicine

## 2019-01-31 ENCOUNTER — Telehealth: Payer: Self-pay | Admitting: *Deleted

## 2019-01-31 ENCOUNTER — Other Ambulatory Visit: Payer: Self-pay

## 2019-01-31 ENCOUNTER — Ambulatory Visit (INDEPENDENT_AMBULATORY_CARE_PROVIDER_SITE_OTHER): Payer: No Typology Code available for payment source | Admitting: Family Medicine

## 2019-01-31 ENCOUNTER — Encounter: Payer: Self-pay | Admitting: Family Medicine

## 2019-01-31 ENCOUNTER — Other Ambulatory Visit (HOSPITAL_COMMUNITY)
Admission: RE | Admit: 2019-01-31 | Discharge: 2019-01-31 | Disposition: A | Discharge status: Home / Self Care | Payer: No Typology Code available for payment source | Source: Ambulatory Visit | Attending: Family Medicine | Admitting: Family Medicine

## 2019-01-31 VITALS — BP 114/84 | HR 69 | Temp 97.1°F | Resp 15 | Ht 68.0 in | Wt 235.0 lb

## 2019-01-31 DIAGNOSIS — E1159 Type 2 diabetes mellitus with other circulatory complications: Secondary | ICD-10-CM

## 2019-01-31 DIAGNOSIS — Z124 Encounter for screening for malignant neoplasm of cervix: Secondary | ICD-10-CM

## 2019-01-31 DIAGNOSIS — E1165 Type 2 diabetes mellitus with hyperglycemia: Secondary | ICD-10-CM | POA: Insufficient documentation

## 2019-01-31 DIAGNOSIS — Z0000 Encounter for general adult medical examination without abnormal findings: Secondary | ICD-10-CM

## 2019-01-31 DIAGNOSIS — Z794 Long term (current) use of insulin: Secondary | ICD-10-CM

## 2019-01-31 DIAGNOSIS — IMO0001 Reserved for inherently not codable concepts without codable children: Secondary | ICD-10-CM

## 2019-01-31 LAB — GLUCOSE, POCT (MANUAL RESULT ENTRY): POC Glucose: 132 mg/dl — AB (ref 70–99)

## 2019-01-31 MED ORDER — BD PEN NEEDLE SHORT U/F 31G X 8 MM MISC: 5 refills | Status: DC

## 2019-01-31 MED ORDER — INSULIN GLARGINE 100 UNIT/ML SOLOSTAR PEN: 15.0000 [IU] | PEN_INJECTOR | Freq: Every day | SUBCUTANEOUS | 3 refills | Status: DC

## 2019-01-31 MED ORDER — INSULIN PEN NEEDLE 31G X 5 MM MISC: 0 refills | Status: DC

## 2019-01-31 MED ORDER — ROSUVASTATIN CALCIUM 20 MG PO TABS: 20.0000 mg | ORAL_TABLET | Freq: Every day | ORAL | 3 refills | Status: DC

## 2019-01-31 MED ORDER — BLOOD GLUCOSE METER KIT: PACK | 0 refills | Status: DC

## 2019-01-31 NOTE — Telephone Encounter (Signed)
Needles sent in 

## 2019-01-31 NOTE — Telephone Encounter (Signed)
Kelly Savage outpatient pharmacy called said they received fax for lantis for pt but wanted to know if someone could send in for the pen needles also.

## 2019-01-31 NOTE — Patient Instructions (Addendum)
Follow-up in 13 weeks call if you need me sooner.MD appt in office , shingles # 1 at visiti  Medication change for cholesterol start Crestor 20 mg 1 at bedtime.  Blood sugar is still not at goal new is Lantus start with 7 units once daily every morning and please contact me in 5 days next week Monday in 6 days next week Monday and with blood sugar reporting.  Please test and record blood sugar fasting fasting in the morning either before lunch or 2 hours after lunch and fasting.  .  If unable to check at lunch it is fine to check before dinner in the evening and then again at bedtime however certainly I would like a fasting and a bedtime blood sugar.  Prescription is sent in for 15 units of Lantus daily however you will start taking only 7 units once daily please note this thank you  Good foot exam  You are referred for eye exam  Fasting lipid, cmp and eGFr, and hBa1C in 12 weeks  Carb counting, eating regularly, and even 15 mins exercise daily will go a LONG way to help the insulin

## 2019-01-31 NOTE — Progress Notes (Signed)
Kelly Savage     MRN: [id number]      DOB: 1956/02/11  HPI: Patient is in for annual physical exam. Uncontrolled diabetes is addressed at the visit. Recent labs, are reviewed. Immunization is reviewed , and  updated if needed.   PE: BP 114/84   Pulse 69   Temp (!) 97.1 F (36.2 C) (Temporal)   Resp 15   Ht 5\' 8"  (1.727 m)   Wt 235 lb (106.6 kg)   SpO2 97%   BMI 35.73 kg/m  Pleasant  female, alert and oriented x 3, in no cardio-pulmonary distress. Afebrile. HEENT No facial trauma or asymetry. Sinuses non tender.  Extra occullar muscles intact, . External ears normal, Neck: supple, no adenopathy,JVD or thyromegaly.No bruits.  Chest: Clear to ascultation bilaterally.No crackles or wheezes. Non tender to palpation  Breast: No asymetry,no masses or lumps. No tenderness. No nipple discharge or inversion. No axillary or supraclavicular adenopathy  Cardiovascular system; Heart sounds normal,  S1 and  S2 ,no S3.  No murmur, or thrill. Apical beat not displaced Peripheral pulses normal.  Abdomen: Soft, non tender, no organomegaly or masses. No bruits. Bowel sounds normal. No guarding, tenderness or rebound.  GU: External genitalia normal female genitalia , normal female distribution of hair. No lesions. Urethral meatus normal in size, no  Prolapse, no lesions visibly  Present. Bladder non tender. Vagina pink and moist , with no visible lesions , discharge present . Adequate pelvic support no  cystocele or rectocele noted Cervix pink and appears healthy, no lesions or ulcerations noted, no discharge noted from os Uterus normal size, no adnexal masses, no cervical motion or adnexal tenderness.   Musculoskeletal exam: Full ROM of spine, hips , shoulders and decreased in  knees. No deformity ,swelling or crepitus noted. No muscle wasting or atrophy.   Neurologic: Cranial nerves 2 to 12 intact. Power, tone ,sensation and reflexes normal throughout. No disturbance  in gait. No tremor.  Skin: Intact, no ulceration, erythema , scaling or rash noted. Pigmentation normal throughout  Psych; Normal mood and affect. Judgement and concentration normal   Assessment & Plan:  Annual physical exam Annual exam as documented. Counseling done  re healthy lifestyle involving commitment to 150 minutes exercise per week, heart healthy diet, and attaining healthy weight.The importance of adequate sleep also discussed. Regular seat belt use and home safety, is also discussed. Changes in health habits are decided on by the patient with goals and time frames  set for achieving them. Immunization and cancer screening needs are specifically addressed at this visit.   Type 2 diabetes mellitus with vascular disease (HCC) Uncontrolled, pt to startinsulin, no change in oral mds Kelly Savage is reminded of the importance of commitment to daily physical activity for 30 minutes or more, as able and the need to limit carbohydrate intake to 30 to 60 grams per meal to help with blood sugar control.   The need to take medication as prescribed, test blood sugar as directed, and to call between visits if there is a concern that blood sugar is uncontrolled is also discussed.   Kelly Savage is reminded of the importance of daily foot exam, annual eye examination, and good blood sugar, blood pressure and cholesterol control.  Diabetic Labs Latest Ref Rng & Units 01/30/2019 09/21/2018 06/28/2018 01/28/2018 10/19/2017  HbA1c 4.8 - 5.6 % 8.6(H) 8.5(H) 9.8(H) 8.8(H) -  Microalbumin mg/dL - - - 1.1 -  Micro/Creat Ratio <30 mcg/mg creat - - - 10 -  Chol 100 - 199 mg/dL - 595 - 638 -  HDL >75 mg/dL - 48 - 58 -  Calc LDL 0 - 99 mg/dL - 65 - 63 -  Triglycerides 0 - 149 mg/dL - 84 - 91 -  Creatinine 0.57 - 1.00 mg/dL 6.43 3.29 5.18 8.41 6.60   BP/Weight 01/31/2019 09/27/2018 06/29/2018 02/01/2018 11/23/2017 10/19/2017 10/18/2017  Systolic BP 114 120 120 120 - 630 -  Diastolic BP 84 68 88 82 - 70 -  Wt.  (Lbs) 235 235 239 240 230 - 236  BMI 35.73 35.73 36.34 36.49 34.97 - 34.85   Foot/eye exam completion dates Latest Ref Rng & Units 01/31/2019 05/25/2017  Eye Exam No Retinopathy - No Retinopathy  Foot exam Order - - -  Foot Form Completion - Done -        Morbid obesity (HCC) Obesity linked with hypertension and diabtes  Patient re-educated about  the importance of commitment to a  minimum of 150 minutes of exercise per week as able.  The importance of healthy food choices with portion control discussed, as well as eating regularly and within a 12 hour window most days. The need to choose "clean , green" food 50 to 75% of the time is discussed, as well as to make water the primary drink and set a goal of 64 ounces water daily.    Weight /BMI 01/31/2019 09/27/2018 06/29/2018  WEIGHT 235 lb 235 lb 239 lb  HEIGHT 5\' 8"  5\' 8"  5\' 8"   BMI 35.73 kg/m2 35.73 kg/m2 36.34 kg/m2

## 2019-01-31 NOTE — Assessment & Plan Note (Signed)

## 2019-02-01 ENCOUNTER — Encounter: Payer: Self-pay | Admitting: Family Medicine

## 2019-02-01 LAB — BMP8+EGFR
BUN/Creatinine Ratio: 19 (ref 12–28)
BUN: 16 mg/dL (ref 8–27)
CO2: 21 mmol/L (ref 20–29)
Calcium: 9.6 mg/dL (ref 8.7–10.3)
Chloride: 101 mmol/L (ref 96–106)
Creatinine, Ser: 0.85 mg/dL (ref 0.57–1.00)
GFR calc Af Amer: 85 mL/min/{1.73_m2} (ref >59)
GFR calc non Af Amer: 74 mL/min/{1.73_m2} (ref >59)
Glucose: 142 mg/dL — ABNORMAL HIGH (ref 65–99)
Potassium: 4.4 mmol/L (ref 3.5–5.2)
Sodium: 138 mmol/L (ref 134–144)

## 2019-02-01 LAB — HEMOGLOBIN A1C
Est. average glucose Bld gHb Est-mCnc: 200 mg/dL
Hgb A1c MFr Bld: 8.6 % — ABNORMAL HIGH (ref 4.8–5.6)

## 2019-02-01 LAB — MICROALBUMIN / CREATININE URINE RATIO
Creatinine, Urine: 71.1 mg/dL
Microalb/Creat Ratio: 5 mg/g creat (ref 0–29)
Microalbumin, Urine: 3.5 ug/mL

## 2019-02-01 NOTE — Assessment & Plan Note (Signed)
Obesity linked with hypertension and diabtes  Patient re-educated about  the importance of commitment to a  minimum of 150 minutes of exercise per week as able.  The importance of healthy food choices with portion control discussed, as well as eating regularly and within a 12 hour window most days. The need to choose "clean , green" food 50 to 75% of the time is discussed, as well as to make water the primary drink and set a goal of 64 ounces water daily.    Weight /BMI 01/31/2019 09/27/2018 06/29/2018  WEIGHT 235 lb 235 lb 239 lb  HEIGHT 5\' 8"  5\' 8"  5\' 8"   BMI 35.73 kg/m2 35.73 kg/m2 36.34 kg/m2

## 2019-02-01 NOTE — Assessment & Plan Note (Signed)
Uncontrolled, pt to startinsulin, no change in oral mds Kelly Savage is reminded of the importance of commitment to daily physical activity for 30 minutes or more, as able and the need to limit carbohydrate intake to 30 to 60 grams per meal to help with blood sugar control.   The need to take medication as prescribed, test blood sugar as directed, and to call between visits if there is a concern that blood sugar is uncontrolled is also discussed.   Kelly Savage is reminded of the importance of daily foot exam, annual eye examination, and good blood sugar, blood pressure and cholesterol control.  Diabetic Labs Latest Ref Rng & Units 01/30/2019 09/21/2018 06/28/2018 01/28/2018 10/19/2017  HbA1c 4.8 - 5.6 % 8.6(H) 8.5(H) 9.8(H) 8.8(H) -  Microalbumin mg/dL - - - 1.1 -  Micro/Creat Ratio <30 mcg/mg creat - - - 10 -  Chol 100 - 199 mg/dL - 130 - 138 -  HDL >39 mg/dL - 48 - 58 -  Calc LDL 0 - 99 mg/dL - 65 - 63 -  Triglycerides 0 - 149 mg/dL - 84 - 91 -  Creatinine 0.57 - 1.00 mg/dL 0.85 0.83 0.78 0.76 0.82   BP/Weight 01/31/2019 09/27/2018 06/29/2018 02/01/2018 11/23/2017 10/19/2017 5/40/9811  Systolic BP 914 782 956 213 - 086 -  Diastolic BP 84 68 88 82 - 70 -  Wt. (Lbs) 235 235 239 240 230 - 236  BMI 35.73 35.73 36.34 36.49 34.97 - 34.85   Foot/eye exam completion dates Latest Ref Rng & Units 01/31/2019 05/25/2017  Eye Exam No Retinopathy - No Retinopathy  Foot exam Order - - -  Foot Form Completion - Done -

## 2019-02-03 ENCOUNTER — Other Ambulatory Visit: Payer: Self-pay | Admitting: Family Medicine

## 2019-02-03 MED FILL — JARDIANCE 10 MG TABLET: 10 | 90 days supply | Qty: 90 | Fill #0

## 2019-02-03 MED FILL — JANUMET XR 50-1,000 MG TAB: 50-1000 | 60 days supply | Qty: 120 | Fill #1

## 2019-02-06 ENCOUNTER — Encounter: Payer: Self-pay | Admitting: Family Medicine

## 2019-02-06 ENCOUNTER — Other Ambulatory Visit: Payer: Self-pay | Admitting: Family Medicine

## 2019-02-06 MED ORDER — GLIPIZIDE ER 10 MG PO TB24: ORAL_TABLET | ORAL | 3 refills | Status: DC

## 2019-02-07 ENCOUNTER — Encounter: Payer: Self-pay | Admitting: Family Medicine

## 2019-02-07 LAB — CYTOLOGY - PAP
Diagnosis: NEGATIVE
High risk HPV: NEGATIVE

## 2019-02-08 MED FILL — DICLOFENAC SODIUM 75 MG TAB: 75 | 60 days supply | Qty: 60 | Fill #2

## 2019-02-13 ENCOUNTER — Encounter: Payer: Self-pay | Admitting: Family Medicine

## 2019-02-20 ENCOUNTER — Encounter: Payer: Self-pay | Admitting: Family Medicine

## 2019-02-23 MED FILL — glipiZIDE ER 10 MG TB24: 10 | 90 days supply | Qty: 180 | Fill #1

## 2019-02-27 ENCOUNTER — Encounter: Payer: Self-pay | Admitting: Family Medicine

## 2019-03-06 ENCOUNTER — Encounter: Payer: Self-pay | Admitting: Family Medicine

## 2019-03-13 ENCOUNTER — Encounter: Payer: Self-pay | Admitting: Family Medicine

## 2019-03-20 ENCOUNTER — Encounter: Payer: Self-pay | Admitting: Family Medicine

## 2019-03-27 ENCOUNTER — Encounter: Payer: Self-pay | Admitting: Family Medicine

## 2019-04-03 ENCOUNTER — Encounter: Payer: Self-pay | Admitting: Family Medicine

## 2019-04-06 MED FILL — JANUMET XR 50-1,000 MG TAB: 50-1000 | 90 days supply | Qty: 180 | Fill #0

## 2019-04-10 ENCOUNTER — Encounter: Payer: Self-pay | Admitting: Family Medicine

## 2019-04-10 MED FILL — DICLOFENAC SODIUM 75 MG TAB: 75 | 60 days supply | Qty: 60 | Fill #3

## 2019-04-17 ENCOUNTER — Encounter: Payer: Self-pay | Admitting: Family Medicine

## 2019-04-17 MED FILL — ROSUVASTATIN CALCIUM 20 MG: 20 | 90 days supply | Qty: 90 | Fill #1

## 2019-04-25 ENCOUNTER — Encounter: Payer: Self-pay | Admitting: Family Medicine

## 2019-05-02 ENCOUNTER — Ambulatory Visit: Payer: No Typology Code available for payment source | Admitting: Family Medicine

## 2019-05-04 ENCOUNTER — Other Ambulatory Visit: Payer: Self-pay | Admitting: Family Medicine

## 2019-05-04 ENCOUNTER — Ambulatory Visit: Payer: No Typology Code available for payment source | Admitting: Family Medicine

## 2019-05-05 ENCOUNTER — Encounter: Payer: Self-pay | Admitting: Family Medicine

## 2019-05-05 LAB — LIPID PANEL
Chol/HDL Ratio: 2.1 ratio (ref 0.0–4.4)
Cholesterol, Total: 108 mg/dL (ref 100–199)
HDL: 51 mg/dL (ref >39)
LDL Chol Calc (NIH): 40 mg/dL (ref 0–99)
Triglycerides: 86 mg/dL (ref 0–149)
VLDL Cholesterol Cal: 17 mg/dL (ref 5–40)

## 2019-05-05 LAB — COMPREHENSIVE METABOLIC PANEL
ALT: 24 IU/L (ref 0–32)
AST: 22 IU/L (ref 0–40)
Albumin/Globulin Ratio: 2 (ref 1.2–2.2)
Albumin: 4.4 g/dL (ref 3.8–4.8)
Alkaline Phosphatase: 67 IU/L (ref 39–117)
BUN/Creatinine Ratio: 23 (ref 12–28)
BUN: 17 mg/dL (ref 8–27)
Bilirubin Total: 1.3 mg/dL — ABNORMAL HIGH (ref 0.0–1.2)
CO2: 22 mmol/L (ref 20–29)
Calcium: 9.4 mg/dL (ref 8.7–10.3)
Chloride: 103 mmol/L (ref 96–106)
Creatinine, Ser: 0.73 mg/dL (ref 0.57–1.00)
GFR calc Af Amer: 101 mL/min/{1.73_m2} (ref >59)
GFR calc non Af Amer: 88 mL/min/{1.73_m2} (ref >59)
Globulin, Total: 2.2 g/dL (ref 1.5–4.5)
Glucose: 163 mg/dL — ABNORMAL HIGH (ref 65–99)
Potassium: 4.2 mmol/L (ref 3.5–5.2)
Sodium: 139 mmol/L (ref 134–144)
Total Protein: 6.6 g/dL (ref 6.0–8.5)

## 2019-05-05 LAB — HGB A1C W/O EAG: Hgb A1c MFr Bld: 7.6 % — ABNORMAL HIGH (ref 4.8–5.6)

## 2019-05-09 ENCOUNTER — Other Ambulatory Visit: Payer: Self-pay | Admitting: Family Medicine

## 2019-05-09 MED FILL — JARDIANCE 10 MG TABLET: 10 | 90 days supply | Qty: 90 | Fill #0

## 2019-05-15 ENCOUNTER — Other Ambulatory Visit: Payer: Self-pay

## 2019-05-15 ENCOUNTER — Ambulatory Visit (INDEPENDENT_AMBULATORY_CARE_PROVIDER_SITE_OTHER): Payer: No Typology Code available for payment source | Admitting: Family Medicine

## 2019-05-15 ENCOUNTER — Encounter: Payer: Self-pay | Admitting: Family Medicine

## 2019-05-15 VITALS — BP 130/84 | HR 75 | Temp 97.0°F | Resp 15 | Ht 69.0 in | Wt 235.0 lb

## 2019-05-15 DIAGNOSIS — E1159 Type 2 diabetes mellitus with other circulatory complications: Secondary | ICD-10-CM

## 2019-05-15 DIAGNOSIS — E785 Hyperlipidemia, unspecified: Secondary | ICD-10-CM | POA: Diagnosis not present

## 2019-05-15 DIAGNOSIS — H269 Unspecified cataract: Secondary | ICD-10-CM | POA: Diagnosis not present

## 2019-05-15 DIAGNOSIS — L259 Unspecified contact dermatitis, unspecified cause: Secondary | ICD-10-CM | POA: Diagnosis not present

## 2019-05-15 MED ORDER — ROSUVASTATIN CALCIUM 20 MG PO TABS: 20.0000 mg | ORAL_TABLET | Freq: Every day | ORAL | 3 refills | Status: DC

## 2019-05-15 NOTE — Patient Instructions (Addendum)
F/U in 2nd week in April , call if you need me before Office visit please  Use hydrocortisone cream OTC, sparingly once daily when you wear mask for face rash. You  may use antibiotic cream if needed, occasionally also   Please get non fasting HBA1C, chem  7 and EGFR March 31 or after ([facility])  Think about what you will eat, plan ahead. Choose " clean, green, fresh or frozen" over canned, processed or packaged foods which are more sugary, salty and fatty. 70 to 75% of food eaten should be vegetables and fruit. Three meals at set times with snacks allowed between meals, but they must be fruit or vegetables. Aim to eat over a 12 hour period , example 7 am to 7 pm, and STOP after  your last meal of the day. Drink water,generally about 64 ounces per day, no other drink is as healthy. Fruit juice is best enjoyed in a healthy way, by EATING the fruit.   It is important that you exercise regularly at least 30 minutes 5 times a week. If you develop chest pain, have severe difficulty breathing, or feel very tired, stop exercising immediately and seek medical attention   Thanks for choosing [facility], we consider it a privelige to serve you.

## 2019-05-15 NOTE — Assessment & Plan Note (Signed)
Marked improvement , pt applauded for this [name] is reminded of the importance of commitment to daily physical activity for 30 minutes or more, as able and the need to limit carbohydrate intake to 30 to 60 grams per meal to help with blood sugar control.   The need to take medication as prescribed, test blood sugar as directed, and to call between visits if there is a concern that blood sugar is uncontrolled is also discussed.   Kelly Savage is reminded of the importance of daily foot exam, annual eye examination, and good blood sugar, blood pressure and cholesterol control.  Diabetic Labs Latest Ref Rng & Units 05/04/2019 01/30/2019 09/21/2018 06/28/2018 01/28/2018  HbA1c 4.8 - 5.6 % 7.6(H) 8.6(H) 8.5(H) 9.8(H) 8.8(H)  Microalbumin mg/dL - - - - 1.1  Micro/Creat Ratio 0 - 29 mg/g creat - 5 - - 10  Chol 100 - 199 mg/dL 382 - 505 - 397  HDL >67 mg/dL 51 - 48 - 58  Calc LDL 0 - 99 mg/dL 40 - 65 - 63  Triglycerides 0 - 149 mg/dL 86 - 84 - 91  Creatinine 0.57 - 1.00 mg/dL 3.41 9.37 9.02 4.09 7.35   BP/Weight 05/15/2019 01/31/2019 09/27/2018 06/29/2018 02/01/2018 11/23/2017 10/19/2017  Systolic BP 130 114 120 120 120 - 122  Diastolic BP 84 84 68 88 82 - 70  Wt. (Lbs) 235 235 235 239 240 230 -  BMI 34.7 35.73 35.73 36.34 36.49 34.97 -   Foot/eye exam completion dates Latest Ref Rng & Units 01/31/2019 05/25/2017  Eye Exam No Retinopathy - No Retinopathy  Foot exam Order - - -  Foot Form Completion - Done -

## 2019-05-15 NOTE — Assessment & Plan Note (Signed)
  Patient re-educated about  the importance of commitment to a  minimum of 150 minutes of exercise per week as able.  The importance of healthy food choices with portion control discussed, as well as eating regularly and within a 12 hour window most days. The need to choose "clean , green" food 50 to 75% of the time is discussed, as well as to make water the primary drink and set a goal of 64 ounces water daily.    Weight /BMI 05/15/2019 01/31/2019 09/27/2018  WEIGHT 235 lb 235 lb 235 lb  HEIGHT 5\' 9"  5\' 8"  5\' 8"   BMI 34.7 kg/m2 35.73 kg/m2 35.73 kg/m2

## 2019-05-15 NOTE — Progress Notes (Signed)
Kelly Savage     MRN: [id number]      DOB: April 30, 1956   HPI Kelly Savage is here for follow up and re-evaluation of chronic medical conditions, medication management and review of any available recent lab and radiology data.  Preventive health is updated, specifically  Cancer screening and Immunization.   Questions or concerns regarding consultations or procedures which the PT has had in the interim are  addressed. The PT denies any adverse reactions to current medications since the last visit.  There are no new concerns.  There are no specific complaints  Improved blood sugars ,  Denies polyuria, polydipsia, blurred vision , or hypoglycemic episodes. C/o intermittent sciatic flares, uses voltaren and tylenol  ROS Denies recent fever or chills. Denies sinus pressure, nasal congestion, ear pain or sore throat. Denies chest congestion, productive cough or wheezing. Denies chest pains, palpitations and leg swelling Denies abdominal pain, nausea, vomiting,diarrhea or constipation.   Denies dysuria, frequency, hesitancy or incontinence.  Denies headaches, seizures, numbness, or tingling. Denies depression, anxiety or insomnia. C/o redness on face around mouth, no new toiletries , has to use face mask daily due to covid and this has been ahte trigger. Notes drooling and angles of mouth get very red and irritated   PE  BP 130/84   Pulse 75   Temp (!) 97 F (36.1 C) (Temporal)   Resp 15   Ht 5\' 9"  (1.753 m)   Wt 235 lb (106.6 kg)   SpO2 96%   BMI 34.70 kg/m   Patient alert and oriented and in no cardiopulmonary distress.  HEENT: No facial asymmetry, EOMI,     Neck supple .  Chest: Clear to auscultation bilaterally.  CVS: S1, S2 no murmurs, no S3.Regular rate.  ABD: Soft non tender.   Ext: No edema  MS: Adequate though reduced  ROM spine, shoulders, hips and knees.  Skin: Intact, erythematous macular rash on cheeks and at angles of the mouth, no purulen rainage, no open  sores  Psych: Good eye contact, normal affect. Memory intact not anxious or depressed appearing.  CNS: CN 2-12 intact, power,  normal throughout.no focal deficits noted.   Assessment & Plan  Type 2 diabetes mellitus with vascular disease (HCC) Marked improvement , pt applauded for this Kelly Savage is reminded of the importance of commitment to daily physical activity for 30 minutes or more, as able and the need to limit carbohydrate intake to 30 to 60 grams per meal to help with blood sugar control.   The need to take medication as prescribed, test blood sugar as directed, and to call between visits if there is a concern that blood sugar is uncontrolled is also discussed.   Kelly Savage is reminded of the importance of daily foot exam, annual eye examination, and good blood sugar, blood pressure and cholesterol control.  Diabetic Labs Latest Ref Rng & Units 05/04/2019 01/30/2019 09/21/2018 06/28/2018 01/28/2018  HbA1c 4.8 - 5.6 % 7.6(H) 8.6(H) 8.5(H) 9.8(H) 8.8(H)  Microalbumin mg/dL - - - - 1.1  Micro/Creat Ratio 0 - 29 mg/g creat - 5 - - 10  Chol 100 - 199 mg/dL 01/30/2018 - 269 - 485  HDL 462 mg/dL 51 - 48 - 58  Calc LDL 0 - 99 mg/dL 40 - 65 - 63  Triglycerides 0 - 149 mg/dL 86 - 84 - 91  Creatinine 0.57 - 1.00 mg/dL >70 3.50 0.93 8.18 2.99   BP/Weight 05/15/2019 01/31/2019 09/27/2018 06/29/2018 02/01/2018 11/23/2017 10/19/2017  Systolic BP 062 694 854 627 035 - 009  Diastolic BP 84 84 68 88 82 - 70  Wt. (Lbs) 235 235 235 239 240 230 -  BMI 34.7 35.73 35.73 36.34 36.49 34.97 -   Foot/eye exam completion dates Latest Ref Rng & Units 01/31/2019 05/25/2017  Eye Exam No Retinopathy - No Retinopathy  Foot exam Order - - -  Foot Form Completion - Done -        Hyperlipidemia with target LDL less than 100 Hyperlipidemia:Low fat diet discussed and encouraged.   Lipid Panel  Lab Results  Component Value Date   CHOL 108 05/04/2019   HDL 51 05/04/2019   LDLCALC 40 05/04/2019   TRIG 86 05/04/2019     CHOLHDL 2.1 05/04/2019   Controlled, no change in medication     Morbid obesity ([place])  Patient re-educated about  the importance of commitment to a  minimum of 150 minutes of exercise per week as able.  The importance of healthy food choices with portion control discussed, as well as eating regularly and within a 12 hour window most days. The need to choose "clean , green" food 50 to 75% of the time is discussed, as well as to make water the primary drink and set a goal of 64 ounces water daily.    Weight /BMI 05/15/2019 01/31/2019 09/27/2018  WEIGHT 235 lb 235 lb 235 lb  HEIGHT 5\' 9"  5\' 8"  5\' 8"   BMI 34.7 kg/m2 35.73 kg/m2 35.73 kg/m2      Contact dermatitis Keep mask off at home. Sparing use of hydrocortisone cream daily

## 2019-05-15 NOTE — Assessment & Plan Note (Signed)
Hyperlipidemia:Low fat diet discussed and encouraged.   Lipid Panel  Lab Results  Component Value Date   CHOL 108 05/04/2019   HDL 51 05/04/2019   LDLCALC 40 05/04/2019   TRIG 86 05/04/2019   CHOLHDL 2.1 05/04/2019   Controlled, no change in medication

## 2019-05-15 NOTE — Assessment & Plan Note (Signed)
Keep mask off at home. Sparing use of hydrocortisone cream daily

## 2019-05-26 MED FILL — glipiZIDE ER 10 MG TB24: 10 | 90 days supply | Qty: 180 | Fill #2

## 2019-06-28 LAB — HM DIABETES EYE EXAM

## 2019-07-06 ENCOUNTER — Other Ambulatory Visit: Payer: Self-pay | Admitting: Family Medicine

## 2019-07-06 MED FILL — DICLOFENAC SOD EC 75 MG TAB: 75 | 60 days supply | Qty: 60 | Fill #0

## 2019-07-07 MED FILL — ROSUVASTATIN CALCIUM 20 MG: 20 | 90 days supply | Qty: 90 | Fill #2

## 2019-07-10 ENCOUNTER — Other Ambulatory Visit: Payer: Self-pay | Admitting: Family Medicine

## 2019-07-10 MED FILL — JANUMET XR 50-1,000 MG TAB: 50-1000 | 60 days supply | Qty: 120 | Fill #0

## 2019-08-15 ENCOUNTER — Ambulatory Visit: Payer: No Typology Code available for payment source | Admitting: Family Medicine

## 2019-08-16 ENCOUNTER — Telehealth: Payer: Self-pay | Admitting: Family Medicine

## 2019-08-16 NOTE — Telephone Encounter (Signed)
Pt will have done

## 2019-08-16 NOTE — Telephone Encounter (Signed)
Labs already ordered are to be done asap, non fasting, blood sugar control is a challenge, pls contact her, thanks

## 2019-08-28 ENCOUNTER — Other Ambulatory Visit: Payer: Self-pay | Admitting: Family Medicine

## 2019-08-28 MED FILL — DICLOFENAC SOD EC 75 MG TAB: 75 | 60 days supply | Qty: 60 | Fill #1

## 2019-08-28 MED FILL — BENAZEPRIL HCL 5 MG TABLET: 5 | 90 days supply | Qty: 90 | Fill #0

## 2019-08-28 MED FILL — glipiZIDE ER 10 MG TB24: 10 | 90 days supply | Qty: 180 | Fill #0

## 2019-08-28 MED FILL — JARDIANCE 10 MG TABLET: 10 | 90 days supply | Qty: 90 | Fill #0

## 2019-09-07 MED FILL — JANUMET XR 50-1,000 MG TAB: 50-1000 | 30 days supply | Qty: 60 | Fill #1

## 2019-10-06 MED FILL — JANUMET XR 50-1,000 MG TAB: 50-1000 | 30 days supply | Qty: 60 | Fill #2

## 2019-10-06 MED FILL — ROSUVASTATIN CALCIUM 20 MG: 20 | 90 days supply | Qty: 90 | Fill #3

## 2019-10-07 ENCOUNTER — Encounter: Payer: Self-pay | Admitting: Family Medicine

## 2019-10-07 LAB — BMP8+EGFR
BUN/Creatinine Ratio: 24 (ref 12–28)
BUN: 16 mg/dL (ref 8–27)
CO2: 21 mmol/L (ref 20–29)
Calcium: 9.3 mg/dL (ref 8.7–10.3)
Chloride: 102 mmol/L (ref 96–106)
Creatinine, Ser: 0.67 mg/dL (ref 0.57–1.00)
GFR calc Af Amer: 108 mL/min/{1.73_m2} (ref >59)
GFR calc non Af Amer: 94 mL/min/{1.73_m2} (ref >59)
Glucose: 151 mg/dL — ABNORMAL HIGH (ref 65–99)
Potassium: 4.1 mmol/L (ref 3.5–5.2)
Sodium: 140 mmol/L (ref 134–144)

## 2019-10-07 LAB — HEMOGLOBIN A1C
Est. average glucose Bld gHb Est-mCnc: 209 mg/dL
Hgb A1c MFr Bld: 8.9 % — ABNORMAL HIGH (ref 4.8–5.6)

## 2019-10-17 ENCOUNTER — Ambulatory Visit (INDEPENDENT_AMBULATORY_CARE_PROVIDER_SITE_OTHER): Payer: No Typology Code available for payment source | Admitting: Family Medicine

## 2019-10-17 ENCOUNTER — Encounter: Payer: Self-pay | Admitting: Family Medicine

## 2019-10-17 ENCOUNTER — Other Ambulatory Visit: Payer: Self-pay

## 2019-10-17 VITALS — BP 128/84 | HR 76 | Temp 97.9°F | Resp 16 | Ht 68.0 in | Wt 225.0 lb

## 2019-10-17 DIAGNOSIS — E1159 Type 2 diabetes mellitus with other circulatory complications: Secondary | ICD-10-CM

## 2019-10-17 DIAGNOSIS — E669 Obesity, unspecified: Secondary | ICD-10-CM

## 2019-10-17 DIAGNOSIS — M170 Bilateral primary osteoarthritis of knee: Secondary | ICD-10-CM

## 2019-10-17 DIAGNOSIS — E785 Hyperlipidemia, unspecified: Secondary | ICD-10-CM | POA: Diagnosis not present

## 2019-10-17 NOTE — Patient Instructions (Addendum)
F/U in office with MD mid September, call if you need me sooner  PLEASE test and record fasting and bedtime blood sugars and send log every week via My chart  Goal for fasting blood sugar ranges from 80 to 130 and 2 hours after any meal or at bedtime should be between 130 to 180.  Fasting lipid, cmp and EGFr, HBA1C and TSH at [facility] 3 to 5  days before visit  It is important that you exercise regularly at least 30 minutes 5 times a week. If you develop chest pain, have severe difficulty breathing, or feel very tired, stop exercising immediately and seek medical attention    Think about what you will eat, plan ahead. Choose " clean, green, fresh or frozen" over canned, processed or packaged foods which are more sugary, salty and fatty. 70 to 75% of food eaten should be vegetables and fruit. Three meals at set times with snacks allowed between meals, but they must be fruit or vegetables. Aim to eat over a 12 hour period , example 7 am to 7 pm, and STOP after  your last meal of the day. Drink water,generally about 64 ounces per day, no other drink is as healthy. Fruit juice is best enjoyed in a healthy way, by EATING the fruit.  Thanks for choosing [facility], we consider it a privelige to serve you.

## 2019-10-22 ENCOUNTER — Encounter: Payer: Self-pay | Admitting: Family Medicine

## 2019-10-22 NOTE — Assessment & Plan Note (Signed)
[name] is reminded of the importance of commitment to daily physical activity for 30 minutes or more, as able and the need to limit carbohydrate intake to 30 to 60 grams per meal to help with blood sugar control.   The need to take medication as prescribed, test blood sugar as directed, and to call between visits if there is a concern that blood sugar is uncontrolled is also discussed.   [name] is reminded of the importance of daily foot exam, annual eye examination, and good blood sugar, blood pressure and cholesterol control.  Diabetic Labs Latest Ref Rng & Units 10/06/2019 05/04/2019 01/30/2019 09/21/2018 06/28/2018  HbA1c 4.8 - 5.6 % 8.9(H) 7.6(H) 8.6(H) 8.5(H) 9.8(H)  Microalbumin mg/dL - - - - -  Micro/Creat Ratio 0 - 29 mg/g creat - - 5 - -  Chol 100 - 199 mg/dL - 917 - 915 -  HDL >05 mg/dL - 51 - 48 -  Calc LDL 0 - 99 mg/dL - 40 - 65 -  Triglycerides 0 - 149 mg/dL - 86 - 84 -  Creatinine 0.57 - 1.00 mg/dL 6.97 9.48 0.16 5.53 7.48   BP/Weight 10/17/2019 05/15/2019 01/31/2019 09/27/2018 06/29/2018 02/01/2018 11/23/2017  Systolic BP 128 130 114 120 120 120 -  Diastolic BP 84 84 84 68 88 82 -  Wt. (Lbs) 225 235 235 235 239 240 230  BMI 34.21 34.7 35.73 35.73 36.34 36.49 34.97   Foot/eye exam completion dates Latest Ref Rng & Units 06/28/2019 01/31/2019  Eye Exam No Retinopathy No Retinopathy -  Foot exam Order - - -  Foot Form Completion - - Done    Deteriorated and uncontrolled, will send in log, wants no med change

## 2019-10-22 NOTE — Assessment & Plan Note (Signed)
Hyperlipidemia:Low fat diet discussed and encouraged.   Lipid Panel  Lab Results  Component Value Date   CHOL 108 05/04/2019   HDL 51 05/04/2019   LDLCALC 40 05/04/2019   TRIG 86 05/04/2019   CHOLHDL 2.1 05/04/2019  Controlled, no change in medication

## 2019-10-22 NOTE — Assessment & Plan Note (Signed)
  Patient re-educated about  the importance of commitment to a  minimum of 150 minutes of exercise per week as able.  The importance of healthy food choices with portion control discussed, as well as eating regularly and within a 12 hour window most days. The need to choose "clean , green" food 50 to 75% of the time is discussed, as well as to make water the primary drink and set a goal of 64 ounces water daily.    Weight /BMI 10/17/2019 05/15/2019 01/31/2019  WEIGHT 225 lb 235 lb 235 lb  HEIGHT 5\' 8"  5\' 9"  5\' 8"   BMI 34.21 kg/m2 34.7 kg/m2 35.73 kg/m2

## 2019-10-22 NOTE — Assessment & Plan Note (Signed)
Marked improvement since replacement, no falls, uses medication sparingly , as needed

## 2019-10-22 NOTE — Progress Notes (Signed)
Kelly Savage     MRN: [id number]      DOB: 10/17/1955   HPI Kelly Savage is here for uncontrolled diabetes, based on recent labs, and also follow up and re-evaluation of chronic medical conditions, medication management and review of any available recent lab and radiology data.  Preventive health is updated, specifically  Cancer screening and Immunization.    The PT denies any adverse reactions to current medications since the last visit.  C/o omngoing excessive stress on the job causing her to have uncontrolled blood sugars, no time to eat on a regular schedule andf often not eating till late in the pm and then , over eating, also no regu;ar exercise  ROS Denies recent fever or chills. Denies sinus pressure, nasal congestion, ear pain or sore throat. Denies chest congestion, productive cough or wheezing. Denies chest pains, palpitations and leg swelling Denies abdominal pain, nausea, vomiting,diarrhea or constipation.   Denies dysuria, frequency, hesitancy or incontinence. Denies uncontrolled joint pain, swelling and limitation in mobility. Denies headaches, seizures, numbness, or tingling. Denies depression, anxiety or insomnia. Denies skin break down or rash.   PE  BP 128/84   Pulse 76   Temp 97.9 F (36.6 C) (Temporal)   Resp 16   Ht 5\' 8"  (1.727 m)   Wt 225 lb (102.1 kg)   SpO2 97%   BMI 34.21 kg/m   Patient alert and oriented and in no cardiopulmonary distress.  HEENT: No facial asymmetry, EOMI,     Neck supple .  Chest: Clear to auscultation bilaterally.  CVS: S1, S2 no murmurs, no S3.Regular rate.  ABD: Soft non tender.   Ext: No edema  MS: Adequate ROM spine, shoulders, hips and knees.  Skin: Intact, no ulcerations or rash noted.  Psych: Good eye contact, normal affect. Memory intact not anxious or depressed appearing.  CNS: CN 2-12 intact, power,  normal throughout.no focal deficits noted.   Assessment & Plan Type 2 diabetes mellitus with vascular  disease ([place]) Kelly Savage is reminded of the importance of commitment to daily physical activity for 30 minutes or more, as able and the need to limit carbohydrate intake to 30 to 60 grams per meal to help with blood sugar control.   The need to take medication as prescribed, test blood sugar as directed, and to call between visits if there is a concern that blood sugar is uncontrolled is also discussed.   Kelly Savage is reminded of the importance of daily foot exam, annual eye examination, and good blood sugar, blood pressure and cholesterol control.  Diabetic Labs Latest Ref Rng & Units 10/06/2019 05/04/2019 01/30/2019 09/21/2018 06/28/2018  HbA1c 4.8 - 5.6 % 8.9(H) 7.6(H) 8.6(H) 8.5(H) 9.8(H)  Microalbumin mg/dL - - - - -  Micro/Creat Ratio 0 - 29 mg/g creat - - 5 - -  Chol 100 - 199 mg/dL - 108 - 130 -  HDL >39 mg/dL - 51 - 48 -  Calc LDL 0 - 99 mg/dL - 40 - 65 -  Triglycerides 0 - 149 mg/dL - 86 - 84 -  Creatinine 0.57 - 1.00 mg/dL 0.67 0.73 0.85 0.83 0.78   BP/Weight 10/17/2019 05/15/2019 01/31/2019 09/27/2018 06/29/2018 02/01/2018 05/12/3233  Systolic BP 573 220 254 270 623 762 -  Diastolic BP 84 84 84 68 88 82 -  Wt. (Lbs) 225 235 235 235 239 240 230  BMI 34.21 34.7 35.73 35.73 36.34 36.49 34.97   Foot/eye exam completion dates Latest Ref Rng & Units  06/28/2019 01/31/2019  Eye Exam No Retinopathy No Retinopathy -  Foot exam Order - - -  Foot Form Completion - - Done    Deteriorated and uncontrolled, will send in log, wants no med change    Obesity (BMI 30.0-34.9)  Patient re-educated about  the importance of commitment to a  minimum of 150 minutes of exercise per week as able.  The importance of healthy food choices with portion control discussed, as well as eating regularly and within a 12 hour window most days. The need to choose "clean , green" food 50 to 75% of the time is discussed, as well as to make water the primary drink and set a goal of 64 ounces water daily.    Weight /BMI  10/17/2019 05/15/2019 01/31/2019  WEIGHT 225 lb 235 lb 235 lb  HEIGHT 5\' 8"  5\' 9"  5\' 8"   BMI 34.21 kg/m2 34.7 kg/m2 35.73 kg/m2      Hyperlipidemia with target LDL less than 100 Hyperlipidemia:Low fat diet discussed and encouraged.   Lipid Panel  Lab Results  Component Value Date   CHOL 108 05/04/2019   HDL 51 05/04/2019   LDLCALC 40 05/04/2019   TRIG 86 05/04/2019   CHOLHDL 2.1 05/04/2019  Controlled, no change in medication      Bilateral primary osteoarthritis of knee Marked improvement since replacement, no falls, uses medication sparingly , as needed

## 2019-10-30 ENCOUNTER — Encounter: Payer: Self-pay | Admitting: Family Medicine

## 2019-11-08 MED FILL — JANUMET XR 50-1,000 MG TAB: 50-1000 | 30 days supply | Qty: 60 | Fill #3

## 2019-11-13 ENCOUNTER — Encounter: Payer: Self-pay | Admitting: Family Medicine

## 2019-11-21 ENCOUNTER — Encounter: Payer: Self-pay | Admitting: Family Medicine

## 2019-11-21 ENCOUNTER — Ambulatory Visit: Payer: No Typology Code available for payment source | Admitting: Family Medicine

## 2019-11-28 ENCOUNTER — Other Ambulatory Visit: Payer: Self-pay | Admitting: Family Medicine

## 2019-11-28 DIAGNOSIS — Z1231 Encounter for screening mammogram for malignant neoplasm of breast: Secondary | ICD-10-CM

## 2019-12-04 MED FILL — DICLOFENAC SOD EC 75 MG TAB: 75 | 60 days supply | Qty: 60 | Fill #2

## 2019-12-04 MED FILL — JARDIANCE 10 MG TABLET: 10 | 90 days supply | Qty: 90 | Fill #1

## 2019-12-04 MED FILL — glipiZIDE ER 10 MG TB24: 10 | 90 days supply | Qty: 180 | Fill #1

## 2019-12-04 MED FILL — JANUMET XR 50-1,000 MG TAB: 50-1000 | 30 days supply | Qty: 60 | Fill #4

## 2019-12-20 ENCOUNTER — Other Ambulatory Visit: Payer: Self-pay

## 2019-12-20 ENCOUNTER — Ambulatory Visit
Admission: RE | Admit: 2019-12-20 | Discharge: 2019-12-20 | Disposition: A | Discharge status: Home / Self Care | Payer: No Typology Code available for payment source | Source: Ambulatory Visit | Attending: Family Medicine | Admitting: Family Medicine

## 2019-12-20 DIAGNOSIS — Z1231 Encounter for screening mammogram for malignant neoplasm of breast: Secondary | ICD-10-CM

## 2019-12-21 ENCOUNTER — Encounter: Payer: Self-pay | Admitting: Family Medicine

## 2020-01-02 MED FILL — ROSUVASTATIN CALCIUM 20 MG: 20 | 90 days supply | Qty: 90 | Fill #0

## 2020-01-03 MED FILL — JANUMET XR 50-1,000 MG TAB: 50-1000 | 30 days supply | Qty: 60 | Fill #5

## 2020-01-29 ENCOUNTER — Ambulatory Visit: Payer: No Typology Code available for payment source | Admitting: Family Medicine

## 2020-02-05 MED FILL — JANUMET XR 50-1,000 MG TAB: 50-1000 | 30 days supply | Qty: 60 | Fill #6

## 2020-02-20 MED FILL — JARDIANCE 10 MG TABLET: 10 | 90 days supply | Qty: 90 | Fill #2

## 2020-02-20 MED FILL — glipiZIDE ER 10 MG TB24: 10 | 90 days supply | Qty: 180 | Fill #2

## 2020-02-20 MED FILL — DICLOFENAC SOD EC 75 MG TAB: 75 | 60 days supply | Qty: 60 | Fill #3

## 2020-02-24 IMAGING — MG DIGITAL SCREENING BILATERAL MAMMOGRAM WITH TOMO AND CAD
8 series · 8 of 24 positions shown · non-contrast
Comparison: Previous exam(s).

ACR Breast Density Category a: The breast tissue is almost entirely
fatty.

CLINICAL DATA: Screening.

EXAM:
DIGITAL SCREENING BILATERAL MAMMOGRAM WITH TOMO AND CAD

[L CC synth-2D]
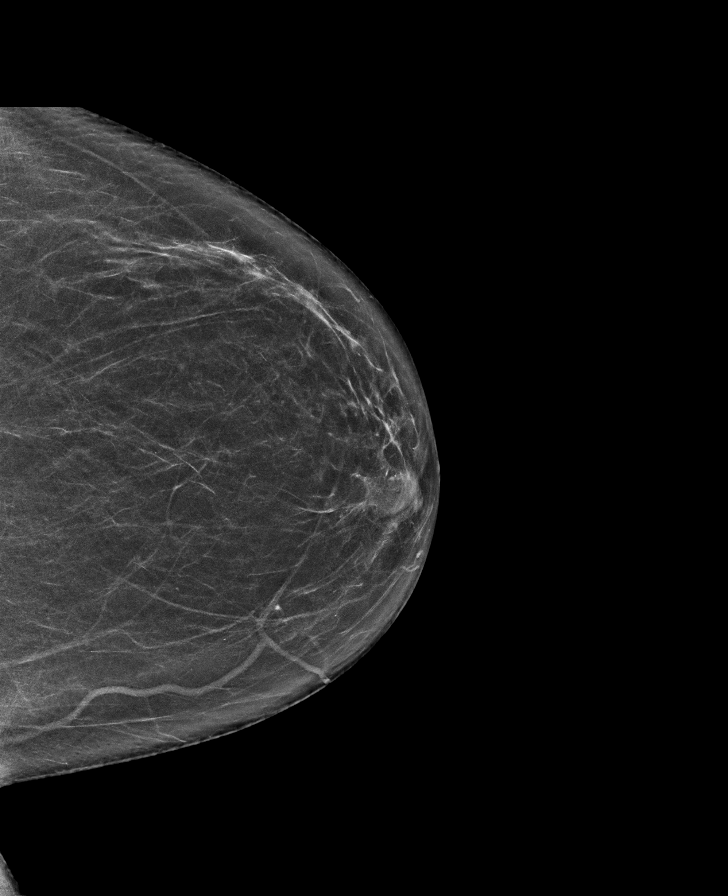

[L MLO synth-2D]
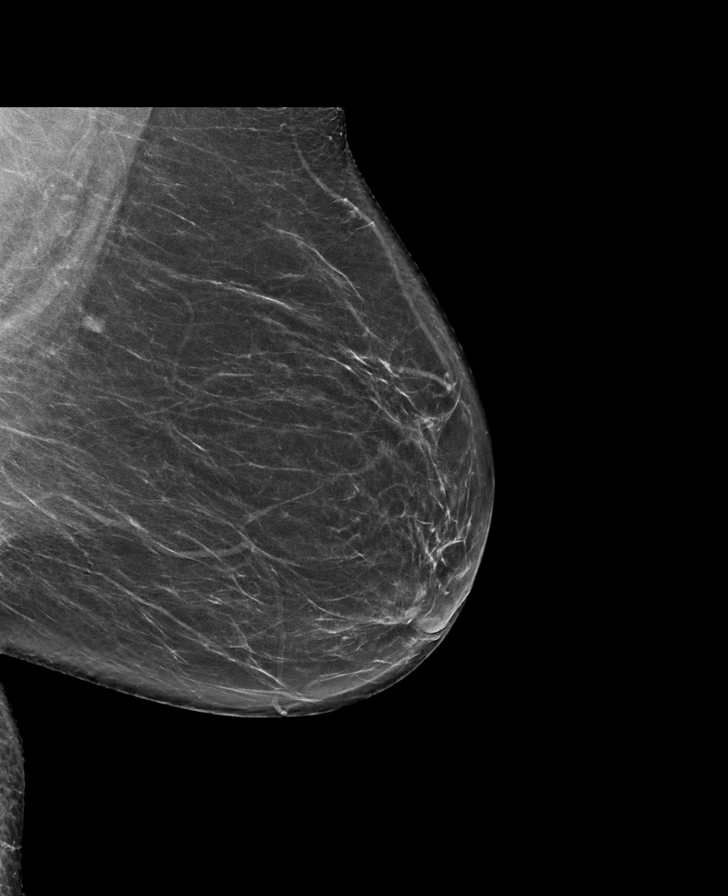

[R CC synth-2D]
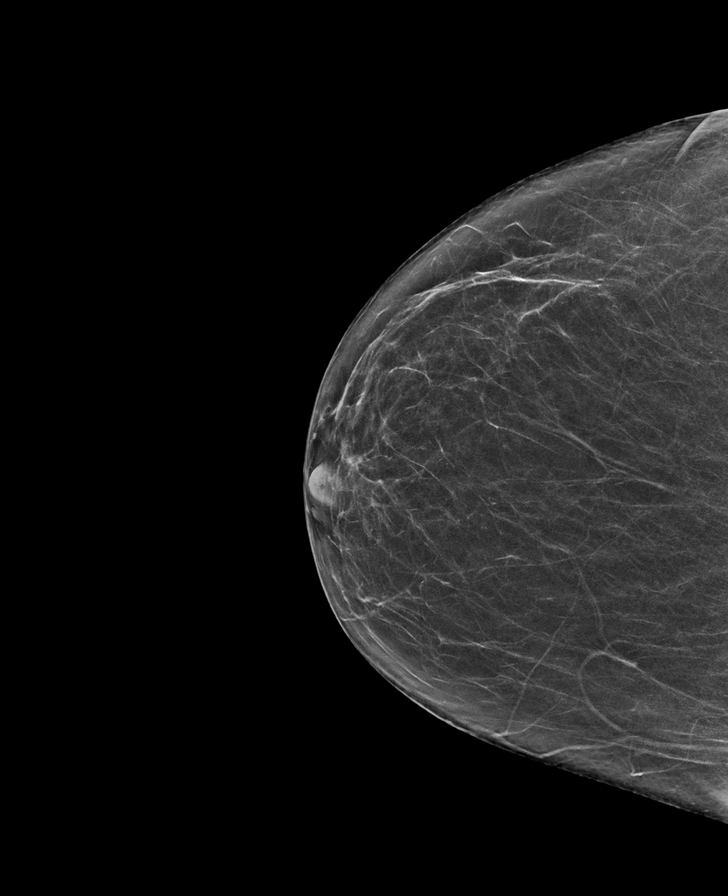

[R MLO synth-2D]
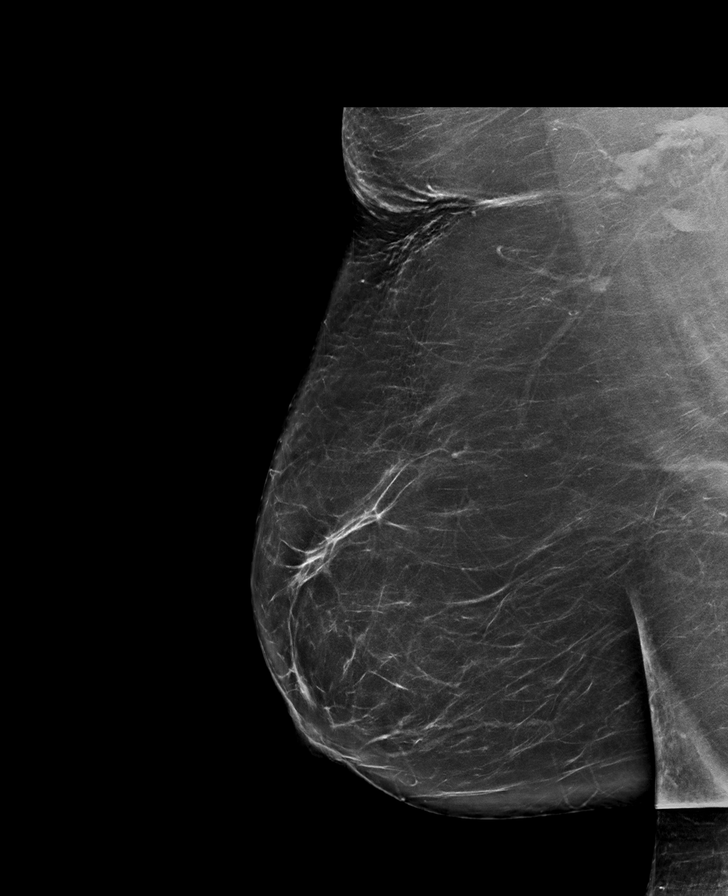

[L MLO tomo · tomo slice 38/75.0]
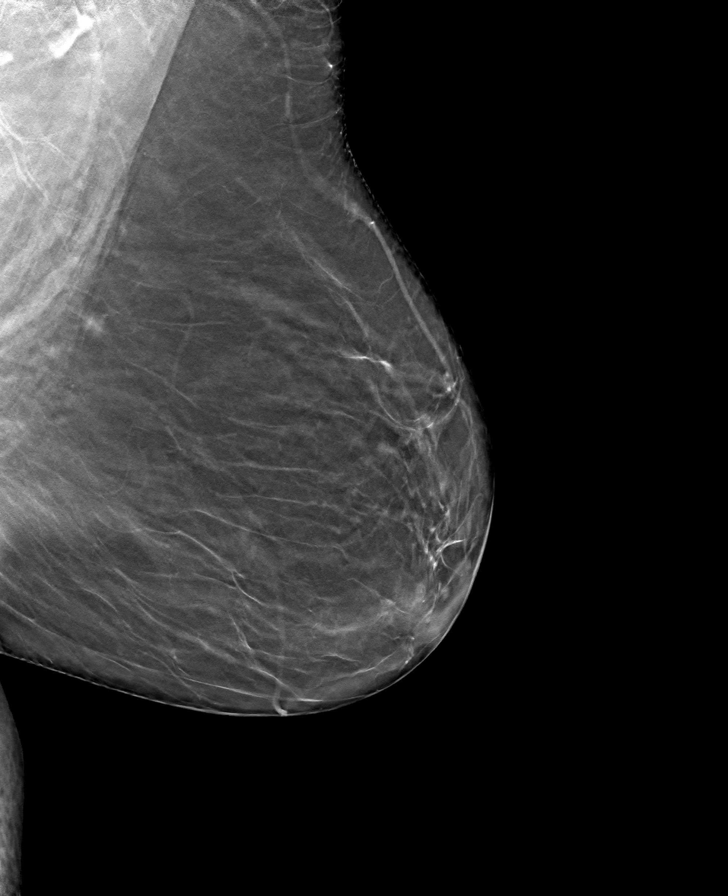

[R MLO tomo · tomo slice 45/89.0]
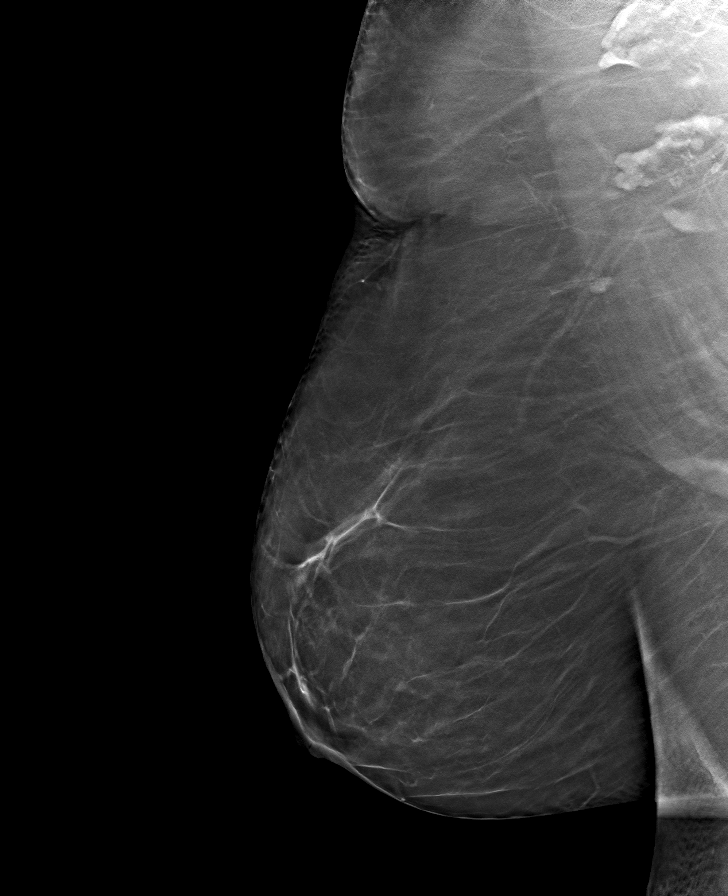

[R CC tomo · tomo slice 32/63.0]
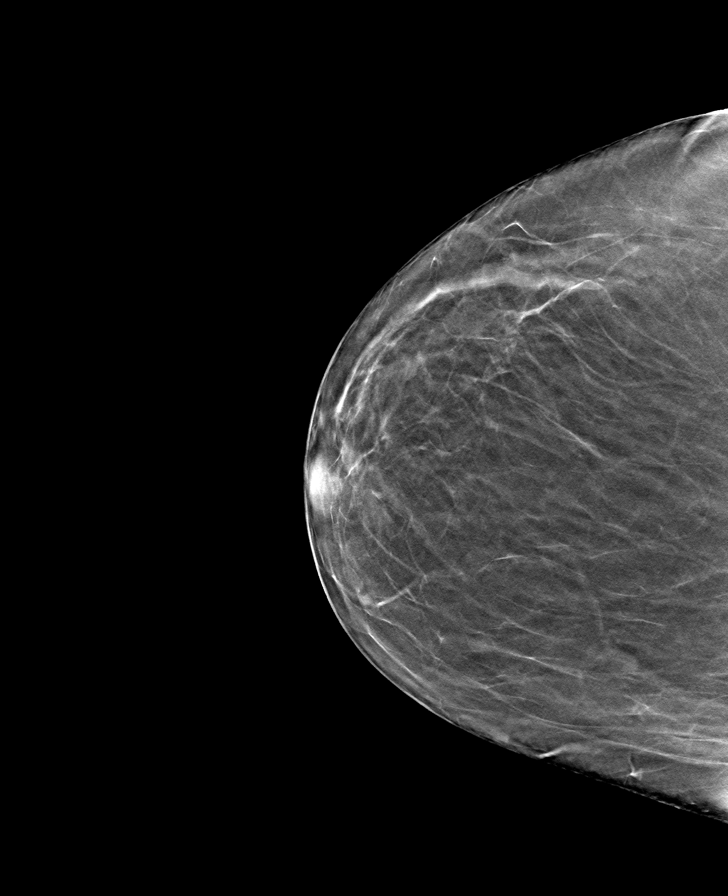

[L CC tomo · tomo slice 33/66.0]
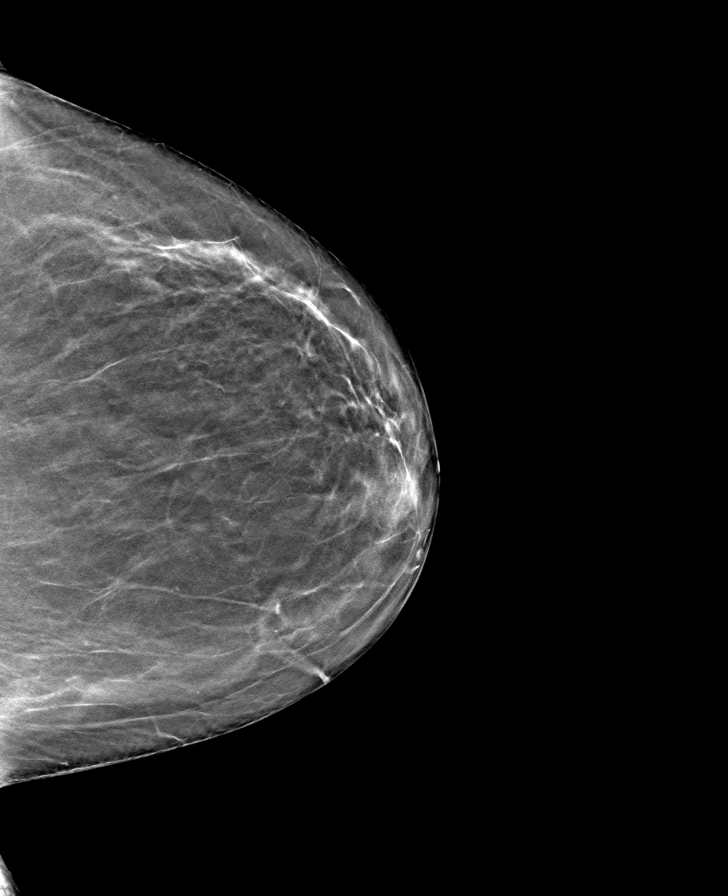

[8 of 24 positions shown; findings below may reference images not displayed]

FINDINGS: There are no findings suspicious for malignancy. Images were
processed with CAD.
IMPRESSION: No mammographic evidence of malignancy. A result letter of this
screening mammogram will be mailed directly to the patient.

RECOMMENDATION:
Screening mammogram in one year. (Code:[id number])

BI-RADS CATEGORY  1: Negative.

## 2020-02-27 ENCOUNTER — Other Ambulatory Visit: Payer: Self-pay

## 2020-02-27 DIAGNOSIS — I152 Hypertension secondary to endocrine disorders: Secondary | ICD-10-CM

## 2020-02-27 DIAGNOSIS — E559 Vitamin D deficiency, unspecified: Secondary | ICD-10-CM

## 2020-02-27 DIAGNOSIS — E1159 Type 2 diabetes mellitus with other circulatory complications: Secondary | ICD-10-CM

## 2020-02-29 ENCOUNTER — Other Ambulatory Visit: Payer: Self-pay | Admitting: Family Medicine

## 2020-02-29 ENCOUNTER — Ambulatory Visit (INDEPENDENT_AMBULATORY_CARE_PROVIDER_SITE_OTHER): Payer: No Typology Code available for payment source | Admitting: Family Medicine

## 2020-02-29 ENCOUNTER — Encounter: Payer: Self-pay | Admitting: Family Medicine

## 2020-02-29 ENCOUNTER — Other Ambulatory Visit: Payer: Self-pay

## 2020-02-29 ENCOUNTER — Ambulatory Visit (HOSPITAL_COMMUNITY)
Admission: RE | Admit: 2020-02-29 | Discharge: 2020-02-29 | Disposition: A | Discharge status: Home / Self Care | Payer: No Typology Code available for payment source | Source: Ambulatory Visit | Attending: Family Medicine | Admitting: Family Medicine

## 2020-02-29 VITALS — BP 131/76 | HR 72 | Resp 16 | Ht 68.0 in | Wt 224.0 lb

## 2020-02-29 DIAGNOSIS — E785 Hyperlipidemia, unspecified: Secondary | ICD-10-CM | POA: Diagnosis not present

## 2020-02-29 DIAGNOSIS — Z794 Long term (current) use of insulin: Secondary | ICD-10-CM

## 2020-02-29 DIAGNOSIS — M79652 Pain in left thigh: Secondary | ICD-10-CM | POA: Diagnosis not present

## 2020-02-29 DIAGNOSIS — I83813 Varicose veins of bilateral lower extremities with pain: Secondary | ICD-10-CM

## 2020-02-29 DIAGNOSIS — E1165 Type 2 diabetes mellitus with hyperglycemia: Secondary | ICD-10-CM | POA: Diagnosis not present

## 2020-02-29 DIAGNOSIS — E669 Obesity, unspecified: Secondary | ICD-10-CM

## 2020-02-29 DIAGNOSIS — E1159 Type 2 diabetes mellitus with other circulatory complications: Secondary | ICD-10-CM

## 2020-02-29 LAB — BMP8+EGFR
BUN/Creatinine Ratio: 21 (ref 12–28)
BUN: 15 mg/dL (ref 8–27)
CO2: 22 mmol/L (ref 20–29)
Calcium: 9.2 mg/dL (ref 8.7–10.3)
Chloride: 102 mmol/L (ref 96–106)
Creatinine, Ser: 0.71 mg/dL (ref 0.57–1.00)
GFR calc Af Amer: 104 mL/min/{1.73_m2} (ref >59)
GFR calc non Af Amer: 90 mL/min/{1.73_m2} (ref >59)
Glucose: 173 mg/dL — ABNORMAL HIGH (ref 65–99)
Potassium: 4.4 mmol/L (ref 3.5–5.2)
Sodium: 138 mmol/L (ref 134–144)

## 2020-02-29 LAB — HEMOGLOBIN A1C
Est. average glucose Bld gHb Est-mCnc: 237 mg/dL
Hgb A1c MFr Bld: 9.9 % — ABNORMAL HIGH (ref 4.8–5.6)

## 2020-02-29 LAB — VITAMIN D 25 HYDROXY (VIT D DEFICIENCY, FRACTURES): Vit D, 25-Hydroxy: 30 ng/mL (ref 30.0–100.0)

## 2020-02-29 MED ORDER — BD PEN NEEDLE SHORT U/F 31G X 8 MM MISC: 5 refills | Status: DC

## 2020-02-29 MED FILL — UNIFINE PENTIPS 8MM 31G: 31G X 8 MM | 90 days supply | Qty: 100 | Fill #0

## 2020-02-29 NOTE — Patient Instructions (Addendum)
Follow-up in office with MD in 6 weeks with diabetic log call if you need me sooner.  New start is Lantus 7 units once daily in the evening.  You will increase every 3 days if needed for an average fasting blood sugar of 131 or more.  Please resume Crestor this is not causing the thigh pain.  You are referred for an ultrasound of your left thigh due to pain and swelling as well as to the vascular surgeons to evaluate your varicose veins.  Please do not be unforgiving to yourself.  When the wrong snack is started as soon as you realize that stop and choose something healthy like pickles or carrots.

## 2020-02-29 NOTE — Assessment & Plan Note (Signed)
Uncontrolled , start lantus 7 units titrate ecvery 3 days, goal of 130 or less FBG Ms. Ricklefs is reminded of the importance of commitment to daily physical activity for 30 minutes or more, as able and the need to limit carbohydrate intake to 30 to 60 grams per meal to help with blood sugar control.   The need to take medication as prescribed, test blood sugar as directed, and to call between visits if there is a concern that blood sugar is uncontrolled is also discussed.   Ms. Haddix is reminded of the importance of daily foot exam, annual eye examination, and good blood sugar, blood pressure and cholesterol control.  Diabetic Labs Latest Ref Rng & Units 02/28/2020 10/06/2019 05/04/2019 01/30/2019 09/21/2018  HbA1c 4.8 - 5.6 % 9.9(H) 8.9(H) 7.6(H) 8.6(H) 8.5(H)  Microalbumin mg/dL - - - - -  Micro/Creat Ratio 0 - 29 mg/g creat - - - 5 -  Chol 100 - 199 mg/dL - - 224 - 825  HDL >00 mg/dL - - 51 - 48  Calc LDL 0 - 99 mg/dL - - 40 - 65  Triglycerides 0 - 149 mg/dL - - 86 - 84  Creatinine 0.57 - 1.00 mg/dL 3.70 4.88 8.91 6.94 5.03   BP/Weight 02/29/2020 10/17/2019 05/15/2019 01/31/2019 09/27/2018 06/29/2018 02/01/2018  Systolic BP 131 128 130 114 120 120 120  Diastolic BP 76 84 84 84 68 88 82  Wt. (Lbs) 224 225 235 235 235 239 240  BMI 34.06 34.21 34.7 35.73 35.73 36.34 36.49   Foot/eye exam completion dates Latest Ref Rng & Units 06/28/2019 01/31/2019  Eye Exam No Retinopathy No Retinopathy -  Foot exam Order - - -  Foot Form Completion - - Done

## 2020-03-03 ENCOUNTER — Encounter: Payer: Self-pay | Admitting: Family Medicine

## 2020-03-03 DIAGNOSIS — I83813 Varicose veins of bilateral lower extremities with pain: Secondary | ICD-10-CM | POA: Insufficient documentation

## 2020-03-03 NOTE — Assessment & Plan Note (Signed)
Bilateral superficial varicose veins , most severe on left thigh, refer to vascular, painful

## 2020-03-03 NOTE — Assessment & Plan Note (Signed)
2 week h/o left thigh pain with swelling and tenderness, venous doppler to r/o DVT Referred to vascular for superficial venous thrombosis

## 2020-03-03 NOTE — Assessment & Plan Note (Signed)
  Patient re-educated about  the importance of commitment to a  minimum of 150 minutes of exercise per week as able.  The importance of healthy food choices with portion control discussed, as well as eating regularly and within a 12 hour window most days. The need to choose "clean , green" food 50 to 75% of the time is discussed, as well as to make water the primary drink and set a goal of 64 ounces water daily.    Weight /BMI 02/29/2020 10/17/2019 05/15/2019  WEIGHT 224 lb 225 lb 235 lb  HEIGHT 5\' 8"  5\' 8"  5\' 9"   BMI 34.06 kg/m2 34.21 kg/m2 34.7 kg/m2

## 2020-03-03 NOTE — Progress Notes (Signed)
Kelly Savage     MRN: [id number]      DOB: 1956-03-29   HPI Kelly Savage is here for follow up and re-evaluation of chronic medical conditions, medication management and review of any available recent lab and radiology data.  Preventive health is updated, specifically  Cancer screening and Immunization.   Reports uncontrolled blood sugar and uncontrolled eating feels overwhelmed, disappointed in herself and is ready to start insulin. 2 week h/o left thigh pain, has very engorged left superficial thigh vein Denies dyspnea or hemoptysis  ROS Denies recent fever or chills. Denies sinus pressure, nasal congestion, ear pain or sore throat. Denies chest congestion, productive cough or wheezing. Denies chest pains, palpitations and leg swelling Denies abdominal pain, nausea, vomiting,diarrhea or constipation.   Denies dysuria, frequency, hesitancy or incontinence.  Denies headaches, seizures, numbness, or tingling.  Denies skin break down or rash.   PE  BP 131/76   Pulse 72   Resp 16   Ht 5\' 8"  (1.727 m)   Wt 224 lb (101.6 kg)   SpO2 97%   BMI 34.06 kg/m   Patient alert and oriented and in no cardiopulmonary distress.  HEENT: No facial asymmetry, EOMI,     Neck supple .  Chest: Clear to auscultation bilaterally.  CVS: S1, S2 no murmurs, no S3.Regular rate.  ABD: Soft non tender.   Ext: left thigh swelling, distended superficial left femoral vein  MS: Adequate ROM spine, shoulders, hips and knees.  Skin: Intact, no ulcerations or rash noted.  Psych: Good eye contact, normal affect. Memory intact not anxious or depressed appearing.  CNS: CN 2-12 intact, power,  normal throughout.no focal deficits noted.   Assessment & Plan  Type 2 diabetes mellitus with vascular disease (HCC) Uncontrolled , start lantus 7 units titrate ecvery 3 days, goal of 130 or less FBG Kelly Savage is reminded of the importance of commitment to daily physical activity for 30 minutes or more, as able  and the need to limit carbohydrate intake to 30 to 60 grams per meal to help with blood sugar control.   The need to take medication as prescribed, test blood sugar as directed, and to call between visits if there is a concern that blood sugar is uncontrolled is also discussed.   Kelly Savage is reminded of the importance of daily foot exam, annual eye examination, and good blood sugar, blood pressure and cholesterol control.  Diabetic Labs Latest Ref Rng & Units 02/28/2020 10/06/2019 05/04/2019 01/30/2019 09/21/2018  HbA1c 4.8 - 5.6 % 9.9(H) 8.9(H) 7.6(H) 8.6(H) 8.5(H)  Microalbumin mg/dL - - - - -  Micro/Creat Ratio 0 - 29 mg/g creat - - - 5 -  Chol 100 - 199 mg/dL - - 09/23/2018 - 546  HDL 270 mg/dL - - 51 - 48  Calc LDL 0 - 99 mg/dL - - 40 - 65  Triglycerides 0 - 149 mg/dL - - 86 - 84  Creatinine 0.57 - 1.00 mg/dL >35 0.09 3.81 8.29 9.37   BP/Weight 02/29/2020 10/17/2019 05/15/2019 01/31/2019 09/27/2018 06/29/2018 02/01/2018  Systolic BP 131 128 130 114 120 120 120  Diastolic BP 76 84 84 84 68 88 82  Wt. (Lbs) 224 225 235 235 235 239 240  BMI 34.06 34.21 34.7 35.73 35.73 36.34 36.49   Foot/eye exam completion dates Latest Ref Rng & Units 06/28/2019 01/31/2019  Eye Exam No Retinopathy No Retinopathy -  Foot exam Order - - -  Foot Form Completion - - Done  Left thigh pain 2 week h/o left thigh pain with swelling and tenderness, venous doppler to r/o DVT Referred to vascular for superficial venous thrombosis  Type 2 diabetes mellitus with hyperglycemia (HCC) Uncontrolled and deteriorating, start lantus 7 units daily, continue current oral meds Kelly Savage is reminded of the importance of commitment to daily physical activity for 30 minutes or more, as able and the need to limit carbohydrate intake to 30 to 60 grams per meal to help with blood sugar control.   The need to take medication as prescribed, test blood sugar as directed, and to call between visits if there is a concern that blood  sugar is uncontrolled is also discussed.   Kelly Savage is reminded of the importance of daily foot exam, annual eye examination, and good blood sugar, blood pressure and cholesterol control.  Diabetic Labs Latest Ref Rng & Units 02/28/2020 10/06/2019 05/04/2019 01/30/2019 09/21/2018  HbA1c 4.8 - 5.6 % 9.9(H) 8.9(H) 7.6(H) 8.6(H) 8.5(H)  Microalbumin mg/dL - - - - -  Micro/Creat Ratio 0 - 29 mg/g creat - - - 5 -  Chol 100 - 199 mg/dL - - 675 - 916  HDL >38 mg/dL - - 51 - 48  Calc LDL 0 - 99 mg/dL - - 40 - 65  Triglycerides 0 - 149 mg/dL - - 86 - 84  Creatinine 0.57 - 1.00 mg/dL 4.66 5.99 3.57 0.17 7.93   BP/Weight 02/29/2020 10/17/2019 05/15/2019 01/31/2019 09/27/2018 06/29/2018 02/01/2018  Systolic BP 131 128 130 114 120 120 120  Diastolic BP 76 84 84 84 68 88 82  Wt. (Lbs) 224 225 235 235 235 239 240  BMI 34.06 34.21 34.7 35.73 35.73 36.34 36.49   Foot/eye exam completion dates Latest Ref Rng & Units 06/28/2019 01/31/2019  Eye Exam No Retinopathy No Retinopathy -  Foot exam Order - - -  Foot Form Completion - - Done   Weekly check in via my chart, will titrate up every 3 days     Obesity (BMI 30.0-34.9)  Patient re-educated about  the importance of commitment to a  minimum of 150 minutes of exercise per week as able.  The importance of healthy food choices with portion control discussed, as well as eating regularly and within a 12 hour window most days. The need to choose "clean , green" food 50 to 75% of the time is discussed, as well as to make water the primary drink and set a goal of 64 ounces water daily.    Weight /BMI 02/29/2020 10/17/2019 05/15/2019  WEIGHT 224 lb 225 lb 235 lb  HEIGHT 5\' 8"  5\' 8"  5\' 9"   BMI 34.06 kg/m2 34.21 kg/m2 34.7 kg/m2      Hyperlipidemia with target LDL less than 100 Hyperlipidemia:Low fat diet discussed and encouraged.   Lipid Panel  Lab Results  Component Value Date   CHOL 108 05/04/2019   HDL 51 05/04/2019   LDLCALC 40 05/04/2019   TRIG 86  05/04/2019   CHOLHDL 2.1 05/04/2019   Updated lab needed at/ before next visit. Advised to resume cresor    Varicose veins of bilateral lower extremities with pain Bilateral superficial varicose veins , most severe on left thigh, refer to vascular, painful

## 2020-03-03 NOTE — Assessment & Plan Note (Signed)
Uncontrolled and deteriorating, start lantus 7 units daily, continue current oral meds Kelly Savage is reminded of the importance of commitment to daily physical activity for 30 minutes or more, as able and the need to limit carbohydrate intake to 30 to 60 grams per meal to help with blood sugar control.   The need to take medication as prescribed, test blood sugar as directed, and to call between visits if there is a concern that blood sugar is uncontrolled is also discussed.   Kelly Savage is reminded of the importance of daily foot exam, annual eye examination, and good blood sugar, blood pressure and cholesterol control.  Diabetic Labs Latest Ref Rng & Units 02/28/2020 10/06/2019 05/04/2019 01/30/2019 09/21/2018  HbA1c 4.8 - 5.6 % 9.9(H) 8.9(H) 7.6(H) 8.6(H) 8.5(H)  Microalbumin mg/dL - - - - -  Micro/Creat Ratio 0 - 29 mg/g creat - - - 5 -  Chol 100 - 199 mg/dL - - 675 - 916  HDL >38 mg/dL - - 51 - 48  Calc LDL 0 - 99 mg/dL - - 40 - 65  Triglycerides 0 - 149 mg/dL - - 86 - 84  Creatinine 0.57 - 1.00 mg/dL 4.66 5.99 3.57 0.17 7.93   BP/Weight 02/29/2020 10/17/2019 05/15/2019 01/31/2019 09/27/2018 06/29/2018 02/01/2018  Systolic BP 131 128 130 114 120 120 120  Diastolic BP 76 84 84 84 68 88 82  Wt. (Lbs) 224 225 235 235 235 239 240  BMI 34.06 34.21 34.7 35.73 35.73 36.34 36.49   Foot/eye exam completion dates Latest Ref Rng & Units 06/28/2019 01/31/2019  Eye Exam No Retinopathy No Retinopathy -  Foot exam Order - - -  Foot Form Completion - - Done   Weekly check in via my chart, will titrate up every 3 days

## 2020-03-03 NOTE — Assessment & Plan Note (Signed)
Hyperlipidemia:Low fat diet discussed and encouraged.   Lipid Panel  Lab Results  Component Value Date   CHOL 108 05/04/2019   HDL 51 05/04/2019   LDLCALC 40 05/04/2019   TRIG 86 05/04/2019   CHOLHDL 2.1 05/04/2019   Updated lab needed at/ before next visit. Advised to resume cresor

## 2020-03-04 ENCOUNTER — Encounter: Payer: Self-pay | Admitting: Family Medicine

## 2020-03-07 MED FILL — JANUMET XR 50-1,000 MG TAB: 50-1000 | 30 days supply | Qty: 60 | Fill #7

## 2020-04-02 ENCOUNTER — Encounter: Payer: Self-pay | Admitting: Family Medicine

## 2020-04-04 MED FILL — ROSUVASTATIN CALCIUM 20 MG: 20 | 90 days supply | Qty: 90 | Fill #1

## 2020-04-04 MED FILL — JANUMET XR 50-1,000 MG TAB: 50-1000 | 30 days supply | Qty: 60 | Fill #8

## 2020-04-29 ENCOUNTER — Other Ambulatory Visit: Payer: Self-pay | Admitting: Family Medicine

## 2020-04-29 DIAGNOSIS — Z794 Long term (current) use of insulin: Secondary | ICD-10-CM

## 2020-04-29 DIAGNOSIS — E1165 Type 2 diabetes mellitus with hyperglycemia: Secondary | ICD-10-CM

## 2020-04-29 MED FILL — JANUMET XR 50-1,000 MG TAB: 50-1000 | 30 days supply | Qty: 60 | Fill #9

## 2020-04-29 MED FILL — DICLOFENAC SOD EC 75 MG TAB: 75 | 60 days supply | Qty: 60 | Fill #0

## 2020-04-30 ENCOUNTER — Encounter: Payer: Self-pay | Admitting: Family Medicine

## 2020-04-30 ENCOUNTER — Telehealth: Payer: Self-pay | Admitting: Family Medicine

## 2020-04-30 DIAGNOSIS — E1159 Type 2 diabetes mellitus with other circulatory complications: Secondary | ICD-10-CM

## 2020-04-30 DIAGNOSIS — E559 Vitamin D deficiency, unspecified: Secondary | ICD-10-CM

## 2020-04-30 DIAGNOSIS — Z794 Long term (current) use of insulin: Secondary | ICD-10-CM

## 2020-04-30 DIAGNOSIS — E785 Hyperlipidemia, unspecified: Secondary | ICD-10-CM

## 2020-04-30 NOTE — Telephone Encounter (Signed)
pls see pt message that I sent today, and arrange both, thanks!

## 2020-04-30 NOTE — Telephone Encounter (Signed)
Left voicemail to call back to get readings and arrange appts

## 2020-04-30 NOTE — Addendum Note (Signed)
Addended by: Abner Greenspan on: 04/30/2020 01:57 PM   Modules accepted: Orders

## 2020-05-03 ENCOUNTER — Telehealth: Payer: Self-pay | Admitting: Family Medicine

## 2020-05-03 ENCOUNTER — Other Ambulatory Visit: Payer: Self-pay | Admitting: Family Medicine

## 2020-05-03 MED ORDER — BASAGLAR KWIKPEN 100 UNIT/ML ~~LOC~~ SOPN: 35.0000 [IU] | PEN_INJECTOR | Freq: Every day | SUBCUTANEOUS | 1 refills | Status: DC

## 2020-05-03 NOTE — Telephone Encounter (Signed)
pls send in any necessary supplies for pt to administer basaglar once daily I have prescribed it, thanks

## 2020-05-06 ENCOUNTER — Other Ambulatory Visit: Payer: Self-pay

## 2020-05-06 MED ORDER — INSULIN PEN NEEDLE 31G X 5 MM MISC: 5 refills | Status: DC

## 2020-05-06 MED FILL — BASAGLAR 100 UNIT/ML KWIKPE: 100 | 85 days supply | Qty: 30 | Fill #0

## 2020-05-06 NOTE — Telephone Encounter (Signed)
Pen needles sent in.

## 2020-05-07 NOTE — Telephone Encounter (Signed)
See mychart messages between dr simpson and pt. I mailed her lab order as requested

## 2020-05-16 ENCOUNTER — Other Ambulatory Visit: Payer: Self-pay | Admitting: Family Medicine

## 2020-05-16 MED FILL — glipiZIDE ER 10 MG TB24: 10 | 90 days supply | Qty: 180 | Fill #0

## 2020-05-31 ENCOUNTER — Other Ambulatory Visit: Payer: Self-pay | Admitting: Family Medicine

## 2020-05-31 MED FILL — FREESTYLE LITE TEST STRIP: 83 days supply | Qty: 250 | Fill #0

## 2020-06-04 MED FILL — JANUMET XR 50-1,000 MG TAB: 50-1000 | 30 days supply | Qty: 60 | Fill #10

## 2020-06-05 ENCOUNTER — Encounter: Payer: Self-pay | Admitting: Family Medicine

## 2020-06-05 ENCOUNTER — Other Ambulatory Visit: Payer: Self-pay | Admitting: Family Medicine

## 2020-06-05 ENCOUNTER — Telehealth (INDEPENDENT_AMBULATORY_CARE_PROVIDER_SITE_OTHER): Payer: No Typology Code available for payment source | Admitting: Family Medicine

## 2020-06-05 ENCOUNTER — Other Ambulatory Visit: Payer: Self-pay

## 2020-06-05 VITALS — BP 118/69 | HR 73 | Ht 69.0 in | Wt 232.0 lb

## 2020-06-05 DIAGNOSIS — E669 Obesity, unspecified: Secondary | ICD-10-CM

## 2020-06-05 DIAGNOSIS — Z96652 Presence of left artificial knee joint: Secondary | ICD-10-CM

## 2020-06-05 DIAGNOSIS — M79652 Pain in left thigh: Secondary | ICD-10-CM

## 2020-06-05 DIAGNOSIS — M5410 Radiculopathy, site unspecified: Secondary | ICD-10-CM | POA: Diagnosis not present

## 2020-06-05 DIAGNOSIS — Z9889 Other specified postprocedural states: Secondary | ICD-10-CM

## 2020-06-05 DIAGNOSIS — Z794 Long term (current) use of insulin: Secondary | ICD-10-CM

## 2020-06-05 DIAGNOSIS — E1165 Type 2 diabetes mellitus with hyperglycemia: Secondary | ICD-10-CM | POA: Diagnosis not present

## 2020-06-05 DIAGNOSIS — E785 Hyperlipidemia, unspecified: Secondary | ICD-10-CM

## 2020-06-05 LAB — TSH: TSH: 2.02 u[IU]/mL (ref 0.450–4.500)

## 2020-06-05 LAB — CMP14+EGFR
ALT: 15 IU/L (ref 0–32)
AST: 17 IU/L (ref 0–40)
Albumin/Globulin Ratio: 2.3 — ABNORMAL HIGH (ref 1.2–2.2)
Albumin: 4.5 g/dL (ref 3.8–4.8)
Alkaline Phosphatase: 73 IU/L (ref 44–121)
BUN/Creatinine Ratio: 21 (ref 12–28)
BUN: 15 mg/dL (ref 8–27)
Bilirubin Total: 0.8 mg/dL (ref 0.0–1.2)
CO2: 26 mmol/L (ref 20–29)
Calcium: 9.3 mg/dL (ref 8.7–10.3)
Chloride: 99 mmol/L (ref 96–106)
Creatinine, Ser: 0.71 mg/dL (ref 0.57–1.00)
GFR calc Af Amer: 104 mL/min/{1.73_m2} (ref >59)
GFR calc non Af Amer: 90 mL/min/{1.73_m2} (ref >59)
Globulin, Total: 2 g/dL (ref 1.5–4.5)
Glucose: 147 mg/dL — ABNORMAL HIGH (ref 65–99)
Potassium: 4.2 mmol/L (ref 3.5–5.2)
Sodium: 138 mmol/L (ref 134–144)
Total Protein: 6.5 g/dL (ref 6.0–8.5)

## 2020-06-05 LAB — LIPID PANEL
Chol/HDL Ratio: 3.2 ratio (ref 0.0–4.4)
Cholesterol, Total: 191 mg/dL (ref 100–199)
HDL: 59 mg/dL (ref >39)
LDL Chol Calc (NIH): 109 mg/dL — ABNORMAL HIGH (ref 0–99)
Triglycerides: 129 mg/dL (ref 0–149)
VLDL Cholesterol Cal: 23 mg/dL (ref 5–40)

## 2020-06-05 LAB — CBC
Hematocrit: 40.9 % (ref 34.0–46.6)
Hemoglobin: 13.3 g/dL (ref 11.1–15.9)
MCH: 28.5 pg (ref 26.6–33.0)
MCHC: 32.5 g/dL (ref 31.5–35.7)
MCV: 88 fL (ref 79–97)
Platelets: 218 10*3/uL (ref 150–450)
RBC: 4.66 x10E6/uL (ref 3.77–5.28)
RDW: 14 % (ref 11.7–15.4)
WBC: 6.5 10*3/uL (ref 3.4–10.8)

## 2020-06-05 LAB — MICROALBUMIN / CREATININE URINE RATIO
Creatinine, Urine: 49.8 mg/dL
Microalb/Creat Ratio: <6 mg/g creat (ref 0–29)
Microalbumin, Urine: <3 ug/mL

## 2020-06-05 LAB — HEMOGLOBIN A1C
Est. average glucose Bld gHb Est-mCnc: 226 mg/dL
Hgb A1c MFr Bld: 9.5 % — ABNORMAL HIGH (ref 4.8–5.6)

## 2020-06-05 MED ORDER — EMPAGLIFLOZIN 10 MG PO TABS: 10.0000 mg | ORAL_TABLET | Freq: Every day | ORAL | 2 refills | Status: DC

## 2020-06-05 MED ORDER — SITAGLIP PHOS-METFORMIN HCL ER 50-1000 MG PO TB24: ORAL_TABLET | ORAL | 2 refills | Status: DC

## 2020-06-05 MED FILL — JARDIANCE 10 MG TABLET: 10 | 90 days supply | Qty: 90 | Fill #0

## 2020-06-05 NOTE — Patient Instructions (Addendum)
F/u video visit with MD in 6 weeks, diabetic log review, call or message me with any concerns about your blood sugar between visits    Goal for fasting blood sugar ranges from 90 to 130 and 2 hours after any meal or at bedtime should be between 140 to 180.   You are being referred to Orthopedics re evaluate left knee post surgery  Please get X ray of your low back at [facility] imaging as discussed. I do believe hat your thigh pain is coming from your spine  It is important that you exercise regularly at least 30 minutes 5 times a week. If you develop chest pain, have severe difficulty breathing, or feel very tired, stop exercising immediately and seek medical attention    PLEASE limit carboydrate to 45 gm per meal and increase low carb snacks  Diabetes Mellitus and Nutrition, Adult When you have diabetes, or diabetes mellitus, it is very important to have healthy eating habits because your blood sugar (glucose) levels are greatly affected by what you eat and drink. Eating healthy foods in the right amounts, at about the same times every day, can help you:  Control your blood glucose.  Lower your risk of heart disease.  Improve your blood pressure.  Reach or maintain a healthy weight. What can affect my meal plan? Every person with diabetes is different, and each person has different needs for a meal plan. Your health care provider may recommend that you work with a dietitian to make a meal plan that is best for you. Your meal plan may vary depending on factors such as:  The calories you need.  The medicines you take.  Your weight.  Your blood glucose, blood pressure, and cholesterol levels.  Your activity level.  Other health conditions you have, such as heart or kidney disease. How do carbohydrates affect me? Carbohydrates, also called carbs, affect your blood glucose level more than any other type of food. Eating carbs naturally raises the amount of glucose in your blood.  Carb counting is a method for keeping track of how many carbs you eat. Counting carbs is important to keep your blood glucose at a healthy level, especially if you use insulin or take certain oral diabetes medicines. It is important to know how many carbs you can safely have in each meal. This is different for every person. Your dietitian can help you calculate how many carbs you should have at each meal and for each snack. How does alcohol affect me? Alcohol can cause a sudden decrease in blood glucose (hypoglycemia), especially if you use insulin or take certain oral diabetes medicines. Hypoglycemia can be a life-threatening condition. Symptoms of hypoglycemia, such as sleepiness, dizziness, and confusion, are similar to symptoms of having too much alcohol.  Do not drink alcohol if: ? Your health care provider tells you not to drink. ? You are pregnant, may be pregnant, or are planning to become pregnant.  If you drink alcohol: ? Do not drink on an empty stomach. ? Limit how much you use to:  0-1 drink a day for women.  0-2 drinks a day for men. ? Be aware of how much alcohol is in your drink. In the [place] one drink equals one 12 oz bottle of beer (355 mL), one 5 oz glass of wine (148 mL), or one 1 oz glass of hard liquor (44 mL). ? Keep yourself hydrated with water, diet soda, or unsweetened iced tea.  Keep in mind that regular soda,  juice, and other mixers may contain a lot of sugar and must be counted as carbs. What are tips for following this plan? Reading food labels  Start by checking the serving size on the "Nutrition Facts" label of packaged foods and drinks. The amount of calories, carbs, fats, and other nutrients listed on the label is based on one serving of the item. Many items contain more than one serving per package.  Check the total grams (g) of carbs in one serving. You can calculate the number of servings of carbs in one serving by dividing the total carbs by 15. For  example, if a food has 30 g of total carbs per serving, it would be equal to 2 servings of carbs.  Check the number of grams (g) of saturated fats and trans fats in one serving. Choose foods that have a low amount or none of these fats.  Check the number of milligrams (mg) of salt (sodium) in one serving. Most people should limit total sodium intake to less than 2,300 mg per day.  Always check the nutrition information of foods labeled as "low-fat" or "nonfat." These foods may be higher in added sugar or refined carbs and should be avoided.  Talk to your dietitian to identify your daily goals for nutrients listed on the label. Shopping  Avoid buying canned, pre-made, or processed foods. These foods tend to be high in fat, sodium, and added sugar.  Shop around the outside edge of the grocery store. This is where you will most often find fresh fruits and vegetables, bulk grains, fresh meats, and fresh dairy. Cooking  Use low-heat cooking methods, such as baking, instead of high-heat cooking methods like deep frying.  Cook using healthy oils, such as olive, canola, or sunflower oil.  Avoid cooking with butter, cream, or high-fat meats. Meal planning  Eat meals and snacks regularly, preferably at the same times every day. Avoid going long periods of time without eating.  Eat foods that are high in fiber, such as fresh fruits, vegetables, beans, and whole grains. Talk with your dietitian about how many servings of carbs you can eat at each meal.  Eat 4-6 oz (112-168 g) of lean protein each day, such as lean meat, chicken, fish, eggs, or tofu. One ounce (oz) of lean protein is equal to: ? 1 oz (28 g) of meat, chicken, or fish. ? 1 egg. ?  cup (62 g) of tofu.  Eat some foods each day that contain healthy fats, such as avocado, nuts, seeds, and fish.   What foods should I eat? Fruits Berries. Apples. Oranges. Peaches. Apricots. Plums. Grapes. Mango. Papaya. Pomegranate. Kiwi.  Cherries. Vegetables Lettuce. Spinach. Leafy greens, including kale, chard, collard greens, and mustard greens. Beets. Cauliflower. Cabbage. Broccoli. Carrots. Green beans. Tomatoes. Peppers. Onions. Cucumbers. Brussels sprouts. Grains Whole grains, such as whole-wheat or whole-grain bread, crackers, tortillas, cereal, and pasta. Unsweetened oatmeal. Quinoa. Brown or wild rice. Meats and other proteins Seafood. Poultry without skin. Lean cuts of poultry and beef. Tofu. Nuts. Seeds. Dairy Low-fat or fat-free dairy products such as milk, yogurt, and cheese. The items listed above may not be a complete list of foods and beverages you can eat. Contact a dietitian for more information. What foods should I avoid? Fruits Fruits canned with syrup. Vegetables Canned vegetables. Frozen vegetables with butter or cream sauce. Grains Refined white flour and flour products such as bread, pasta, snack foods, and cereals. Avoid all processed foods. Meats and other proteins Fatty cuts  of meat. Poultry with skin. Breaded or fried meats. Processed meat. Avoid saturated fats. Dairy Full-fat yogurt, cheese, or milk. Beverages Sweetened drinks, such as soda or iced tea. The items listed above may not be a complete list of foods and beverages you should avoid. Contact a dietitian for more information. Questions to ask a health care provider  Do I need to meet with a diabetes educator?  Do I need to meet with a dietitian?  What number can I call if I have questions?  When are the best times to check my blood glucose? Where to find more information:  American Diabetes Association: diabetes.org  Academy of Nutrition and Dietetics: www.eatright.[employer] of Diabetes and Digestive and Kidney Diseases: CarFlippers.tn  Association of Diabetes Care and Education Specialists: www.diabeteseducator.org Summary  It is important to have healthy eating habits because your blood sugar  (glucose) levels are greatly affected by what you eat and drink.  A healthy meal plan will help you control your blood glucose and maintain a healthy lifestyle.  Your health care provider may recommend that you work with a dietitian to make a meal plan that is best for you.  Keep in mind that carbohydrates (carbs) and alcohol have immediate effects on your blood glucose levels. It is important to count carbs and to use alcohol carefully. This information is not intended to replace advice given to you by your health care provider. Make sure you discuss any questions you have with your health care provider. Document Revised: 03/28/2019 Document Reviewed: 03/28/2019 Elsevier Patient Education  2021 Elsevier Inc.  Diabetes Mellitus and Exercise Exercising regularly is important for overall health, especially for people who have diabetes mellitus. Exercising is not only about losing weight. It has many other health benefits, such as increasing muscle strength and bone density and reducing body fat and stress. This leads to improved fitness, flexibility, and endurance, all of which result in better overall health. What are the benefits of exercise if I have diabetes? Exercise has many benefits for people with diabetes. They include:  Helping to lower and control blood sugar (glucose).  Helping the body to respond better to the hormone insulin by improving insulin sensitivity.  Reducing how much insulin the body needs.  Lowering the risk for heart disease by: ? Lowering "bad" cholesterol and triglyceride levels. ? Increasing "good" cholesterol levels. ? Lowering blood pressure. ? Lowering blood glucose levels. What is my activity plan? Your health care provider or certified diabetes educator can help you make a plan for the type and frequency of exercise that works for you. This is called your activity plan. Be sure to:  Get at least 150 minutes of medium-intensity or high-intensity exercise  each week. Exercises may include brisk walking, biking, or water aerobics.  Do stretching and strengthening exercises, such as yoga or weight lifting, at least 2 times a week.  Spread out your activity over at least 3 days of the week.  Get some form of physical activity each day. ? Do not go more than 2 days in a row without some kind of physical activity. ? Avoid being inactive for more than 90 minutes at a time. Take frequent breaks to walk or stretch.  Choose exercises or activities that you enjoy. Set realistic goals.  Start slowly and gradually increase your exercise intensity over time.   How do I manage my diabetes during exercise? Monitor your blood glucose  Check your blood glucose before and after exercising. If your  blood glucose is: ? 240 mg/dL (92.1 mmol/L) or higher before you exercise, check your urine for ketones. These are chemicals created by the liver. If you have ketones in your urine, do not exercise until your blood glucose returns to normal. ? 100 mg/dL (5.6 mmol/L) or lower, eat a snack containing 15-20 grams of carbohydrate. Check your blood glucose 15 minutes after the snack to make sure that your glucose level is above 100 mg/dL (5.6 mmol/L) before you start your exercise.  Know the symptoms of low blood glucose (hypoglycemia) and how to treat it. Your risk for hypoglycemia increases during and after exercise. Follow these tips and your health care provider's instructions  Keep a carbohydrate snack that is fast-acting for use before, during, and after exercise to help prevent or treat hypoglycemia.  Avoid injecting insulin into areas of the body that are going to be exercised. For example, avoid injecting insulin into: ? Your arms, when you are about to play tennis. ? Your legs, when you are about to go jogging.  Keep records of your exercise habits. Doing this can help you and your health care provider adjust your diabetes management plan as needed. Write  down: ? Food that you eat before and after you exercise. ? Blood glucose levels before and after you exercise. ? The type and amount of exercise you have done.  Work with your health care provider when you start a new exercise or activity. He or she may need to: ? Make sure that the activity is safe for you. ? Adjust your insulin, other medicines, and food that you eat.  Drink plenty of water while you exercise. This prevents loss of water (dehydration) and problems caused by a lot of heat in the body (heat stroke).   Where to find more information  American Diabetes Association: www.diabetes.org Summary  Exercising regularly is important for overall health, especially for people who have diabetes mellitus.  Exercising has many health benefits. It increases muscle strength and bone density and reduces body fat and stress. It also lowers and controls blood glucose.  Your health care provider or certified diabetes educator can help you make an activity plan for the type and frequency of exercise that works for you.  Work with your health care provider to make sure any new activity is safe for you. Also work with your health care provider to adjust your insulin, other medicines, and the food you eat. This information is not intended to replace advice given to you by your health care provider. Make sure you discuss any questions you have with your health care provider. Document Revised: 01/16/2019 Document Reviewed: 01/16/2019 Elsevier Patient Education  2021 [employer].

## 2020-06-09 ENCOUNTER — Encounter: Payer: Self-pay | Admitting: Family Medicine

## 2020-06-09 DIAGNOSIS — Z96652 Presence of left artificial knee joint: Secondary | ICD-10-CM | POA: Insufficient documentation

## 2020-06-09 NOTE — Assessment & Plan Note (Signed)
  Patient re-educated about  the importance of commitment to a  minimum of 150 minutes of exercise per week as able.  The importance of healthy food choices with portion control discussed, as well as eating regularly and within a 12 hour window most days. The need to choose "clean , green" food 50 to 75% of the time is discussed, as well as to make water the primary drink and set a goal of 64 ounces water daily.    Weight /BMI 06/05/2020 02/29/2020 10/17/2019  WEIGHT 232 lb 224 lb 225 lb  HEIGHT 5\' 9"  5\' 8"  5\' 8"   BMI 34.26 kg/m2 34.06 kg/m2 34.21 kg/m2

## 2020-06-09 NOTE — Assessment & Plan Note (Signed)
Ongoing left thigh and groin pain, negative DVT, likely from spine, needs X ray lumbar prior to MRI, reports disabling pain which keeps her awake at night

## 2020-06-09 NOTE — Assessment & Plan Note (Signed)
Uncontrolled , resume jardiance, test 2 to 3 tims daily, diabetic educator recommended but pt not currently interested F/u in 6 weeks  Kelly Savage is reminded of the importance of commitment to daily physical activity for 30 minutes or more, as able and the need to limit carbohydrate intake to 30 to 60 grams per meal to help with blood sugar control.   The need to take medication as prescribed, test blood sugar as directed, and to call between visits if there is a concern that blood sugar is uncontrolled is also discussed.   Kelly Savage is reminded of the importance of daily foot exam, annual eye examination, and good blood sugar, blood pressure and cholesterol control.  Diabetic Labs Latest Ref Rng & Units 06/03/2020 02/28/2020 10/06/2019 05/04/2019 01/30/2019  HbA1c 4.8 - 5.6 % 9.5(H) 9.9(H) 8.9(H) 7.6(H) 8.6(H)  Microalbumin mg/dL - - - - -  Micro/Creat Ratio 0 - 29 mg/g creat <6 - - - 5  Chol 100 - 199 mg/dL 951 - - 884 -  HDL >16 mg/dL 59 - - 51 -  Calc LDL 0 - 99 mg/dL 606(T) - - 40 -  Triglycerides 0 - 149 mg/dL 016 - - 86 -  Creatinine 0.57 - 1.00 mg/dL 0.10 9.32 3.55 7.32 2.02   BP/Weight 06/05/2020 02/29/2020 10/17/2019 05/15/2019 01/31/2019 09/27/2018 06/29/2018  Systolic BP 118 131 128 130 114 120 120  Diastolic BP 69 76 84 84 84 68 88  Wt. (Lbs) 132 224 225 235 235 235 239  BMI 19.49 34.06 34.21 34.7 35.73 35.73 36.34   Foot/eye exam completion dates Latest Ref Rng & Units 06/28/2019 01/31/2019  Eye Exam No Retinopathy No Retinopathy -  Foot exam Order - - -  Foot Form Completion - - Done

## 2020-06-09 NOTE — Assessment & Plan Note (Signed)
Reports increased left knee and thigh pain requests Ortho re eval of knee

## 2020-06-09 NOTE — Progress Notes (Signed)
Virtual Visit via Telephone Note  I connected with Kelly Savage on 06/09/20 at  8:40 AM EST by telephone and verified that I am speaking with the correct person using two identifiers.  Location: Patient: home Provider: office   I discussed the limitations, risks, security and privacy concerns of performing an evaluation and management service by telephone and the availability of in person appointments. I also discussed with the patient that there may be a patient responsible charge related to this service. The patient expressed understanding and agreed to proceed.   History of Present Illness: F/U uncontrolled diabetes and other chronic problems My blood sugar was better when I was on jardiance and want to resume this, also following and sticking with my diet is a big challenge I want a referral back to Ortho who did my knee replacement I continue to have significant thigh and groin pain   Observations/Objective: BP 118/69   Pulse 73   Ht 5\' 9"  (1.753 m)   Wt 232 lb (105.2 kg)   BMI 34.26 kg/m  Good communication with no confusion and intact memory. Alert and oriented x 3 No signs of respiratory distress during speech    Assessment and Plan: S/P total knee arthroplasty, left Reports increased left knee and thigh pain requests Ortho re eval of knee  Back pain with left-sided radiculopathy Ongoing left thigh and groin pain, negative DVT, likely from spine, needs X ray lumbar prior to MRI, reports disabling pain which keeps her awake at night   Type 2 diabetes mellitus with hyperglycemia (HCC) Uncontrolled , resume jardiance, test 2 to 3 tims daily, diabetic educator recommended but pt not currently interested F/u in 6 weeks  Kelly Savage is reminded of the importance of commitment to daily physical activity for 30 minutes or more, as able and the need to limit carbohydrate intake to 30 to 60 grams per meal to help with blood sugar control.   The need to take medication as  prescribed, test blood sugar as directed, and to call between visits if there is a concern that blood sugar is uncontrolled is also discussed.   Kelly Savage is reminded of the importance of daily foot exam, annual eye examination, and good blood sugar, blood pressure and cholesterol control.  Diabetic Labs Latest Ref Rng & Units 06/03/2020 02/28/2020 10/06/2019 05/04/2019 01/30/2019  HbA1c 4.8 - 5.6 % 9.5(H) 9.9(H) 8.9(H) 7.6(H) 8.6(H)  Microalbumin mg/dL - - - - -  Micro/Creat Ratio 0 - 29 mg/g creat <6 - - - 5  Chol 100 - 199 mg/dL 02/01/2019 - - 833 -  HDL 825 mg/dL 59 - - 51 -  Calc LDL 0 - 99 mg/dL >05) - - 40 -  Triglycerides 0 - 149 mg/dL 397(Q - - 86 -  Creatinine 0.57 - 1.00 mg/dL 03-24-2006 1.93 7.90 2.40 9.73   BP/Weight 06/05/2020 02/29/2020 10/17/2019 05/15/2019 01/31/2019 09/27/2018 06/29/2018  Systolic BP 118 131 128 130 114 120 120  Diastolic BP 69 76 84 84 84 68 88  Wt. (Lbs) 132 224 225 235 235 235 239  BMI 19.49 34.06 34.21 34.7 35.73 35.73 36.34   Foot/eye exam completion dates Latest Ref Rng & Units 06/28/2019 01/31/2019  Eye Exam No Retinopathy No Retinopathy -  Foot exam Order - - -  Foot Form Completion - - Done        Obesity (BMI 30.0-34.9)  Patient re-educated about  the importance of commitment to a  minimum of 150 minutes of exercise  per week as able.  The importance of healthy food choices with portion control discussed, as well as eating regularly and within a 12 hour window most days. The need to choose "clean , green" food 50 to 75% of the time is discussed, as well as to make water the primary drink and set a goal of 64 ounces water daily.    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   Lipid Panel  Lab Results  Component Value Date   CHOL 191 06/03/2020   HDL 59 06/03/2020   LDLCALC 109 (H) 06/03/2020   TRIG 129 06/03/2020   CHOLHDL 3.2 06/03/2020  needs to lower fat intake       Follow Up Instructions:    I discussed the assessment and treatment plan with the patient. The patient was provided an opportunity to ask questions and all were answered. The patient agreed with the plan and demonstrated an understanding of the instructions.   The patient was advised to call back or seek an in-person evaluation if the symptoms worsen or if the condition fails to improve as anticipated.  I provided 24 minutes of non-face-to-face time during this encounter.   06/05/2020, MD

## 2020-06-09 NOTE — Assessment & Plan Note (Signed)
Hyperlipidemia:Low fat diet discussed and encouraged.   Lipid Panel  Lab Results  Component Value Date   CHOL 191 06/03/2020   HDL 59 06/03/2020   LDLCALC 109 (H) 06/03/2020   TRIG 129 06/03/2020   CHOLHDL 3.2 06/03/2020  needs to lower fat intake

## 2020-06-09 NOTE — Assessment & Plan Note (Signed)
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

## 2020-06-12 ENCOUNTER — Encounter: Payer: Self-pay | Admitting: Orthopaedic Surgery

## 2020-06-17 ENCOUNTER — Encounter: Payer: Self-pay | Admitting: Orthopaedic Surgery

## 2020-06-18 ENCOUNTER — Encounter: Payer: Self-pay | Admitting: Family Medicine

## 2020-06-18 ENCOUNTER — Other Ambulatory Visit: Payer: Self-pay

## 2020-06-18 ENCOUNTER — Telehealth: Payer: Self-pay

## 2020-06-18 ENCOUNTER — Ambulatory Visit
Admission: RE | Admit: 2020-06-18 | Discharge: 2020-06-18 | Disposition: A | Discharge status: Home / Self Care | Payer: No Typology Code available for payment source | Source: Ambulatory Visit | Attending: Family Medicine | Admitting: Family Medicine

## 2020-06-18 DIAGNOSIS — M5410 Radiculopathy, site unspecified: Secondary | ICD-10-CM

## 2020-06-18 DIAGNOSIS — Z96652 Presence of left artificial knee joint: Secondary | ICD-10-CM

## 2020-06-18 DIAGNOSIS — E1159 Type 2 diabetes mellitus with other circulatory complications: Secondary | ICD-10-CM

## 2020-06-18 NOTE — Telephone Encounter (Signed)
Sent a mychart message to pt asking what the referrals were for. Waiting on response

## 2020-06-18 NOTE — Telephone Encounter (Signed)
Pt needs 2 referrals  One to [facility]  And   One to Dr Charlton Haws with [facility]

## 2020-07-05 ENCOUNTER — Encounter: Payer: Self-pay | Admitting: Family Medicine

## 2020-07-05 MED FILL — DICLOFENAC SOD EC 75 MG TAB: 75 | 60 days supply | Qty: 60 | Fill #1

## 2020-07-05 MED FILL — JANUMET XR 50-1,000 MG TAB: 50-1000 | 30 days supply | Qty: 60 | Fill #0

## 2020-07-09 ENCOUNTER — Telehealth: Payer: No Typology Code available for payment source | Admitting: Family Medicine

## 2020-07-09 ENCOUNTER — Other Ambulatory Visit: Payer: Self-pay

## 2020-07-11 ENCOUNTER — Telehealth (INDEPENDENT_AMBULATORY_CARE_PROVIDER_SITE_OTHER): Payer: No Typology Code available for payment source | Admitting: Family Medicine

## 2020-07-11 ENCOUNTER — Other Ambulatory Visit: Payer: Self-pay

## 2020-07-11 VITALS — BP 121/72 | HR 70 | Ht 69.0 in | Wt 235.0 lb

## 2020-07-11 DIAGNOSIS — E1159 Type 2 diabetes mellitus with other circulatory complications: Secondary | ICD-10-CM

## 2020-07-11 DIAGNOSIS — M5410 Radiculopathy, site unspecified: Secondary | ICD-10-CM

## 2020-07-11 DIAGNOSIS — E785 Hyperlipidemia, unspecified: Secondary | ICD-10-CM

## 2020-07-11 DIAGNOSIS — E669 Obesity, unspecified: Secondary | ICD-10-CM

## 2020-07-11 DIAGNOSIS — Z1231 Encounter for screening mammogram for malignant neoplasm of breast: Secondary | ICD-10-CM

## 2020-07-11 DIAGNOSIS — E1165 Type 2 diabetes mellitus with hyperglycemia: Secondary | ICD-10-CM

## 2020-07-11 DIAGNOSIS — Z124 Encounter for screening for malignant neoplasm of cervix: Secondary | ICD-10-CM

## 2020-07-11 DIAGNOSIS — Z794 Long term (current) use of insulin: Secondary | ICD-10-CM

## 2020-07-11 NOTE — Progress Notes (Signed)
Virtual Visit via Telephone Note  I connected with Kelly Savage on 07/11/20 at  2:40 PM EST by telephone and verified that I am speaking with the correct person using two identifiers.  Location: Patient: home Provider: office   I discussed the limitations, risks, security and privacy concerns of performing an evaluation and management service by telephone and the availability of in person appointments. I also discussed with the patient that there may be a patient responsible charge related to this service. The patient expressed understanding and agreed to proceed.   History of Present Illness: Testing twice daily, fasting range from 80 to 140,  Checks before dinner and range is under 200 Has now decided on following up with neurosurgery for lateral thigh pain , an appointment is already scheduled and she needs an MRI which she will schedule once it is approved No regular exercise and working hard at diet    Observations/Objective: BP 121/72   Pulse 70   Ht 5\' 9"  (1.753 m)   Wt 235 lb (106.6 kg)   BMI 34.70 kg/m  Good communication with no confusion and intact memory. Alert and oriented x 3 No signs of respiratory distress during speech    Assessment and Plan: Type 2 diabetes mellitus with hyperglycemia (HCC) Uncontrolled but improving Reduce insulin dose Kelly Savage is reminded of the importance of commitment to daily physical activity for 30 minutes or more, as able and the need to limit carbohydrate intake to 30 to 60 grams per meal to help with blood sugar control.   The need to take medication as prescribed, test blood sugar as directed, and to call between visits if there is a concern that blood sugar is uncontrolled is also discussed.   Kelly Savage is reminded of the importance of daily foot exam, annual eye examination, and good blood sugar, blood pressure and cholesterol control.  Diabetic Labs Latest Ref Rng & Units 06/03/2020 02/28/2020 10/06/2019 05/04/2019 01/30/2019   HbA1c 4.8 - 5.6 % 9.5(H) 9.9(H) 8.9(H) 7.6(H) 8.6(H)  Microalbumin mg/dL - - - - -  Micro/Creat Ratio 0 - 29 mg/g creat <6 - - - 5  Chol 100 - 199 mg/dL 02/01/2019 - - 973 -  HDL 532 mg/dL 59 - - 51 -  Calc LDL 0 - 99 mg/dL >99) - - 40 -  Triglycerides 0 - 149 mg/dL 242(A - - 86 -  Creatinine 0.57 - 1.00 mg/dL 03-24-2006 1.96 2.22 9.79 8.92   BP/Weight 07/11/2020 06/05/2020 02/29/2020 10/17/2019 05/15/2019 01/31/2019 09/27/2018  Systolic BP 121 118 131 128 130 114 120  Diastolic BP 72 69 76 84 84 84 68  Wt. (Lbs) 235 232 224 225 235 235 235  BMI 34.7 34.26 34.06 34.21 34.7 35.73 35.73   Foot/eye exam completion dates Latest Ref Rng & Units 06/28/2019 01/31/2019  Eye Exam No Retinopathy No Retinopathy -  Foot exam Order - - -  Foot Form Completion - - Done        Hyperlipidemia with target LDL less than 100 Hyperlipidemia:Low fat diet discussed and encouraged.   Lipid Panel  Lab Results  Component Value Date   CHOL 191 06/03/2020   HDL 59 06/03/2020   LDLCALC 109 (H) 06/03/2020   TRIG 129 06/03/2020   CHOLHDL 3.2 06/03/2020   Updated lab needed at/ before next visit. Not at goal    Back pain with left-sided radiculopathy Ongoing thigh pain, needs MRI lumbar spine , X ray is abnormal  Obesity (BMI 30.0-34.9)  Patient re-educated about  the importance of commitment to a  minimum of 150 minutes of exercise per week as able.  The importance of healthy food choices with portion control discussed, as well as eating regularly and within a 12 hour window most days. The need to choose "clean , green" food 50 to 75% of the time is discussed, as well as to make water the primary drink and set a goal of 64 ounces water daily.    Weight /BMI 07/11/2020 06/05/2020 02/29/2020  WEIGHT 235 lb 232 lb 224 lb  HEIGHT 5\' 9"  5\' 9"  5\' 8"   BMI 34.7 kg/m2 34.26 kg/m2 34.06 kg/m2        Follow Up Instructions:    I discussed the assessment and treatment plan with the patient. The patient was  provided an opportunity to ask questions and all were answered. The patient agreed with the plan and demonstrated an understanding of the instructions.   The patient was advised to call back or seek an in-person evaluation if the symptoms worsen or if the condition fails to improve as anticipated.  I provided 22 minutes of non-face-to-face time during this encounter.   , MD

## 2020-07-11 NOTE — Patient Instructions (Addendum)
F/u in office with MD  2nd week in August,TdAP at that visit, call if you need me sooner.  Please schedule your mammogram due 08/19 or after , as we discussed  Congrats on improved blood sugar  Need to get fasting lipid, cmp and EGFr and HBA1C on Sep 02, 2020, [facility] at your workplace.  Please arrange cologuard testing , patient  preference at this time for colon cancer screen   I will contact you with the results, if they are good , you will not need a visit until August, I am expecting that to be the case  You are being referred for an MRI of your low back, once we have this approved we will let you know so that you can schedule your appointment  Test blood sugar before breakfast, range is 90 to 130 Test blood sugar at bedtime, at least 2 hours after dinner: Range is 140 to 180  Reduce insulin to 25 units  Continue to send weekly blood sugar readings  It is important that you exercise regularly at least 30 minutes 5 times a week. If you develop chest pain, have severe difficulty breathing, or feel very tired, stop exercising immediately and seek medical attention   Thanks for choosing [facility], we consider it a privelige to serve you.

## 2020-07-13 ENCOUNTER — Encounter: Payer: Self-pay | Admitting: Family Medicine

## 2020-07-13 NOTE — Assessment & Plan Note (Signed)
Hyperlipidemia:Low fat diet discussed and encouraged.   Lipid Panel  Lab Results  Component Value Date   CHOL 191 06/03/2020   HDL 59 06/03/2020   LDLCALC 109 (H) 06/03/2020   TRIG 129 06/03/2020   CHOLHDL 3.2 06/03/2020   Updated lab needed at/ before next visit. Not at goal

## 2020-07-13 NOTE — Assessment & Plan Note (Signed)
Uncontrolled but improving Reduce insulin dose Kelly Savage is reminded of the importance of commitment to daily physical activity for 30 minutes or more, as able and the need to limit carbohydrate intake to 30 to 60 grams per meal to help with blood sugar control.   The need to take medication as prescribed, test blood sugar as directed, and to call between visits if there is a concern that blood sugar is uncontrolled is also discussed.   Kelly Savage is reminded of the importance of daily foot exam, annual eye examination, and good blood sugar, blood pressure and cholesterol control.  Diabetic Labs Latest Ref Rng & Units 06/03/2020 02/28/2020 10/06/2019 05/04/2019 01/30/2019  HbA1c 4.8 - 5.6 % 9.5(H) 9.9(H) 8.9(H) 7.6(H) 8.6(H)  Microalbumin mg/dL - - - - -  Micro/Creat Ratio 0 - 29 mg/g creat <6 - - - 5  Chol 100 - 199 mg/dL 563 - - 893 -  HDL >73 mg/dL 59 - - 51 -  Calc LDL 0 - 99 mg/dL 428(J) - - 40 -  Triglycerides 0 - 149 mg/dL 681 - - 86 -  Creatinine 0.57 - 1.00 mg/dL 1.57 2.62 0.35 5.97 4.16   BP/Weight 07/11/2020 06/05/2020 02/29/2020 10/17/2019 05/15/2019 01/31/2019 09/27/2018  Systolic BP 121 118 131 128 130 114 120  Diastolic BP 72 69 76 84 84 84 68  Wt. (Lbs) 235 232 224 225 235 235 235  BMI 34.7 34.26 34.06 34.21 34.7 35.73 35.73   Foot/eye exam completion dates Latest Ref Rng & Units 06/28/2019 01/31/2019  Eye Exam No Retinopathy No Retinopathy -  Foot exam Order - - -  Foot Form Completion - - Done

## 2020-07-13 NOTE — Assessment & Plan Note (Signed)
Ongoing thigh pain, needs MRI lumbar spine , X ray is abnormal

## 2020-07-13 NOTE — Assessment & Plan Note (Signed)
  Patient re-educated about  the importance of commitment to a  minimum of 150 minutes of exercise per week as able.  The importance of healthy food choices with portion control discussed, as well as eating regularly and within a 12 hour window most days. The need to choose "clean , green" food 50 to 75% of the time is discussed, as well as to make water the primary drink and set a goal of 64 ounces water daily.    Weight /BMI 07/11/2020 06/05/2020 02/29/2020  WEIGHT 235 lb 232 lb 224 lb  HEIGHT 5\' 9"  5\' 9"  5\' 8"   BMI 34.7 kg/m2 34.26 kg/m2 34.06 kg/m2

## 2020-07-16 ENCOUNTER — Telehealth: Payer: No Typology Code available for payment source | Admitting: Family Medicine

## 2020-07-17 ENCOUNTER — Telehealth: Payer: No Typology Code available for payment source | Admitting: Family Medicine

## 2020-07-23 ENCOUNTER — Encounter: Payer: Self-pay | Admitting: Family Medicine

## 2020-07-26 LAB — COLOGUARD: Cologuard: NEGATIVE

## 2020-07-30 ENCOUNTER — Ambulatory Visit
Admission: RE | Admit: 2020-07-30 | Discharge: 2020-07-30 | Disposition: A | Discharge status: Home / Self Care | Payer: No Typology Code available for payment source | Source: Ambulatory Visit | Attending: Family Medicine | Admitting: Family Medicine

## 2020-07-30 ENCOUNTER — Other Ambulatory Visit: Payer: Self-pay

## 2020-07-30 DIAGNOSIS — M5410 Radiculopathy, site unspecified: Secondary | ICD-10-CM

## 2020-08-07 ENCOUNTER — Other Ambulatory Visit: Payer: Self-pay | Admitting: Family Medicine

## 2020-08-07 ENCOUNTER — Other Ambulatory Visit (HOSPITAL_COMMUNITY): Payer: Self-pay

## 2020-08-07 MED FILL — Insulin Pen Needle 31 G X 8 MM (1/3" or 5/16"): 90 days supply | Qty: 100 | Fill #0 | Status: AC

## 2020-08-08 ENCOUNTER — Other Ambulatory Visit (HOSPITAL_COMMUNITY): Payer: Self-pay

## 2020-08-09 ENCOUNTER — Other Ambulatory Visit (HOSPITAL_COMMUNITY): Payer: Self-pay

## 2020-08-09 ENCOUNTER — Other Ambulatory Visit: Payer: Self-pay

## 2020-08-09 ENCOUNTER — Encounter: Payer: Self-pay | Admitting: Family Medicine

## 2020-08-09 MED ORDER — SITAGLIP PHOS-METFORMIN HCL ER 50-1000 MG PO TB24
1.0000 | ORAL_TABLET | Freq: Two times a day (BID) | ORAL | 3 refills | Status: DC
  Filled 2020-08-09: qty 60, 30d supply, fill #0
  Filled 2020-09-12: qty 60, 30d supply, fill #1

## 2020-08-09 MED ORDER — EMPAGLIFLOZIN 10 MG PO TABS
10.0000 mg | ORAL_TABLET | Freq: Every day | ORAL | 2 refills | Status: DC
  Filled 2020-08-09 – 2020-08-30 (×2): qty 90, 90d supply, fill #0
  Filled 2020-11-21: qty 90, 90d supply, fill #1
  Filled 2021-03-25: qty 90, 90d supply, fill #2

## 2020-08-13 ENCOUNTER — Other Ambulatory Visit: Payer: Self-pay

## 2020-08-13 ENCOUNTER — Telehealth: Payer: Self-pay

## 2020-08-13 ENCOUNTER — Other Ambulatory Visit: Payer: Self-pay | Admitting: Family Medicine

## 2020-08-13 ENCOUNTER — Other Ambulatory Visit (HOSPITAL_COMMUNITY): Payer: Self-pay

## 2020-08-13 DIAGNOSIS — E1159 Type 2 diabetes mellitus with other circulatory complications: Secondary | ICD-10-CM

## 2020-08-13 MED ORDER — FREESTYLE LITE TEST VI STRP
ORAL_STRIP | 11 refills | Status: DC
  Filled 2020-08-13: qty 100, 90d supply, fill #0

## 2020-08-13 MED ORDER — BASAGLAR KWIKPEN 100 UNIT/ML ~~LOC~~ SOPN
35.0000 [IU] | PEN_INJECTOR | Freq: Every day | SUBCUTANEOUS | 1 refills | Status: DC
  Filled 2020-08-13: qty 30, 85d supply, fill #0

## 2020-08-13 MED ORDER — BASAGLAR KWIKPEN 100 UNIT/ML ~~LOC~~ SOPN
35.0000 [IU] | PEN_INJECTOR | Freq: Every day | SUBCUTANEOUS | 1 refills | Status: DC
  Filled 2020-08-13: qty 30, fill #0

## 2020-08-13 NOTE — Telephone Encounter (Signed)
Spoke with pt, she is fine with her current insulin coverage and does not wish to change. Pt also needed refill on test strips, sent in.

## 2020-08-13 NOTE — Telephone Encounter (Signed)
Please call regarding medication  Semglee, would be the cheaper for the pt

## 2020-08-22 ENCOUNTER — Other Ambulatory Visit: Payer: Self-pay | Admitting: Family Medicine

## 2020-08-22 ENCOUNTER — Other Ambulatory Visit: Payer: Self-pay

## 2020-08-22 ENCOUNTER — Encounter: Payer: Self-pay | Admitting: Family Medicine

## 2020-08-22 ENCOUNTER — Other Ambulatory Visit (HOSPITAL_COMMUNITY): Payer: Self-pay

## 2020-08-22 DIAGNOSIS — E1159 Type 2 diabetes mellitus with other circulatory complications: Secondary | ICD-10-CM

## 2020-08-22 MED ORDER — ROSUVASTATIN CALCIUM 20 MG PO TABS
20.0000 mg | ORAL_TABLET | Freq: Every day | ORAL | 3 refills | Status: DC
  Filled 2020-08-22: qty 90, 90d supply, fill #0
  Filled 2020-11-06: qty 90, 90d supply, fill #1
  Filled 2021-02-17: qty 90, 90d supply, fill #2

## 2020-08-22 MED ORDER — SEMGLEE 100 UNIT/ML ~~LOC~~ SOLN
35.0000 [IU] | Freq: Every day | SUBCUTANEOUS | 11 refills | Status: DC
  Filled 2020-08-22: qty 10, 28d supply, fill #0

## 2020-08-22 MED ORDER — BASAGLAR KWIKPEN 100 UNIT/ML ~~LOC~~ SOPN
35.0000 [IU] | PEN_INJECTOR | Freq: Every day | SUBCUTANEOUS | 3 refills | Status: DC
  Filled 2020-08-22: qty 30, 85d supply, fill #0

## 2020-08-22 NOTE — Addendum Note (Signed)
Addended by: Kerri Perches on: 08/22/2020 02:40 PM   Modules accepted: Orders

## 2020-08-23 ENCOUNTER — Other Ambulatory Visit (HOSPITAL_COMMUNITY): Payer: Self-pay

## 2020-08-23 ENCOUNTER — Other Ambulatory Visit: Payer: Self-pay | Admitting: Family Medicine

## 2020-08-23 MED ORDER — INSULIN GLARGINE-YFGN 100 UNIT/ML ~~LOC~~ SOPN
35.0000 [IU] | PEN_INJECTOR | Freq: Every day | SUBCUTANEOUS | 11 refills | Status: DC
  Filled 2020-08-23: qty 30, 86d supply, fill #0
  Filled 2020-11-21: qty 30, 86d supply, fill #1

## 2020-08-30 ENCOUNTER — Other Ambulatory Visit (HOSPITAL_COMMUNITY): Payer: Self-pay

## 2020-08-30 MED FILL — Glipizide Tab ER 24HR 10 MG: ORAL | 90 days supply | Qty: 180 | Fill #0 | Status: AC

## 2020-09-02 ENCOUNTER — Other Ambulatory Visit (HOSPITAL_COMMUNITY): Payer: Self-pay

## 2020-09-12 ENCOUNTER — Other Ambulatory Visit (HOSPITAL_COMMUNITY): Payer: Self-pay

## 2020-09-13 ENCOUNTER — Other Ambulatory Visit (HOSPITAL_COMMUNITY): Payer: Self-pay

## 2020-09-17 ENCOUNTER — Encounter: Payer: Self-pay | Admitting: Family Medicine

## 2020-09-17 LAB — HM DIABETES EYE EXAM

## 2020-09-18 ENCOUNTER — Other Ambulatory Visit: Payer: Self-pay | Admitting: Family Medicine

## 2020-09-18 ENCOUNTER — Other Ambulatory Visit (HOSPITAL_COMMUNITY): Payer: Self-pay

## 2020-09-18 MED ORDER — METFORMIN HCL ER (MOD) 1000 MG PO TB24
ORAL_TABLET | ORAL | 5 refills | Status: DC
  Filled 2020-09-18: qty 60, fill #0

## 2020-09-23 ENCOUNTER — Other Ambulatory Visit (HOSPITAL_COMMUNITY): Payer: Self-pay

## 2020-09-23 ENCOUNTER — Encounter: Payer: Self-pay | Admitting: Family Medicine

## 2020-09-23 ENCOUNTER — Other Ambulatory Visit: Payer: Self-pay | Admitting: Family Medicine

## 2020-09-23 MED ORDER — METFORMIN HCL 500 MG PO TABS
1000.0000 mg | ORAL_TABLET | Freq: Two times a day (BID) | ORAL | 1 refills | Status: DC
  Filled 2020-09-23: qty 180, 45d supply, fill #0
  Filled 2020-11-06: qty 180, 45d supply, fill #1

## 2020-09-23 MED ORDER — METFORMIN HCL ER (MOD) 1000 MG PO TB24
ORAL_TABLET | ORAL | 5 refills | Status: DC
  Filled 2020-09-23: qty 60, fill #0

## 2020-09-23 MED FILL — Diclofenac Sodium Tab Delayed Release 75 MG: ORAL | 30 days supply | Qty: 60 | Fill #0 | Status: AC

## 2020-09-27 ENCOUNTER — Encounter: Payer: Self-pay | Admitting: Family Medicine

## 2020-10-09 ENCOUNTER — Other Ambulatory Visit: Payer: Self-pay | Admitting: Family Medicine

## 2020-10-10 ENCOUNTER — Ambulatory Visit (INDEPENDENT_AMBULATORY_CARE_PROVIDER_SITE_OTHER): Payer: No Typology Code available for payment source | Admitting: Family Medicine

## 2020-10-10 ENCOUNTER — Other Ambulatory Visit (HOSPITAL_COMMUNITY): Payer: Self-pay

## 2020-10-10 ENCOUNTER — Encounter: Payer: Self-pay | Admitting: Family Medicine

## 2020-10-10 ENCOUNTER — Other Ambulatory Visit: Payer: Self-pay | Admitting: *Deleted

## 2020-10-10 ENCOUNTER — Other Ambulatory Visit: Payer: Self-pay

## 2020-10-10 VITALS — BP 120/78 | HR 73 | Temp 97.8°F | Resp 18 | Ht 69.0 in | Wt 241.4 lb

## 2020-10-10 DIAGNOSIS — Z23 Encounter for immunization: Secondary | ICD-10-CM | POA: Diagnosis not present

## 2020-10-10 DIAGNOSIS — E669 Obesity, unspecified: Secondary | ICD-10-CM

## 2020-10-10 DIAGNOSIS — Z0000 Encounter for general adult medical examination without abnormal findings: Secondary | ICD-10-CM | POA: Diagnosis not present

## 2020-10-10 DIAGNOSIS — E785 Hyperlipidemia, unspecified: Secondary | ICD-10-CM | POA: Diagnosis not present

## 2020-10-10 DIAGNOSIS — E1159 Type 2 diabetes mellitus with other circulatory complications: Secondary | ICD-10-CM

## 2020-10-10 DIAGNOSIS — E1165 Type 2 diabetes mellitus with hyperglycemia: Secondary | ICD-10-CM

## 2020-10-10 DIAGNOSIS — Z794 Long term (current) use of insulin: Secondary | ICD-10-CM

## 2020-10-10 LAB — BMP8+EGFR
BUN/Creatinine Ratio: 22 (ref 12–28)
BUN: 14 mg/dL (ref 8–27)
CO2: 22 mmol/L (ref 20–29)
Calcium: 9.1 mg/dL (ref 8.7–10.3)
Chloride: 105 mmol/L (ref 96–106)
Creatinine, Ser: 0.65 mg/dL (ref 0.57–1.00)
Glucose: 115 mg/dL — ABNORMAL HIGH (ref 65–99)
Potassium: 4 mmol/L (ref 3.5–5.2)
Sodium: 144 mmol/L (ref 134–144)
eGFR: 98 mL/min/{1.73_m2} (ref >59)

## 2020-10-10 LAB — HEMOGLOBIN A1C
Est. average glucose Bld gHb Est-mCnc: 209 mg/dL
Hgb A1c MFr Bld: 8.9 % — ABNORMAL HIGH (ref 4.8–5.6)

## 2020-10-10 MED ORDER — FREESTYLE LITE TEST VI STRP
ORAL_STRIP | 11 refills | Status: DC
  Filled 2020-10-10 – 2020-12-24 (×2): qty 50, 50d supply, fill #0
  Filled 2021-02-20: qty 50, 50d supply, fill #1
  Filled 2021-04-30: qty 50, 50d supply, fill #2
  Filled 2021-06-27: qty 50, 50d supply, fill #3
  Filled 2021-08-21: qty 50, 50d supply, fill #4

## 2020-10-10 MED ORDER — FREESTYLE LANCETS MISC
2 refills | Status: AC
  Filled 2020-10-10: qty 100, 90d supply, fill #0

## 2020-10-10 NOTE — Assessment & Plan Note (Signed)
Uncontrolled and deteriorated, refer to endo Kelly Savage is reminded of the importance of commitment to daily physical activity for 30 minutes or more, as able and the need to limit carbohydrate intake to 30 to 60 grams per meal to help with blood sugar control.   The need to take medication as prescribed, test blood sugar as directed, and to call between visits if there is a concern that blood sugar is uncontrolled is also discussed.   Kelly Savage is reminded of the importance of daily foot exam, annual eye examination, and good blood sugar, blood pressure and cholesterol control.  Diabetic Labs Latest Ref Rng & Units 06/03/2020 02/28/2020 10/06/2019 05/04/2019 01/30/2019  HbA1c 4.8 - 5.6 % 9.5(H) 9.9(H) 8.9(H) 7.6(H) 8.6(H)  Microalbumin mg/dL - - - - -  Micro/Creat Ratio 0 - 29 mg/g creat <6 - - - 5  Chol 100 - 199 mg/dL 035 - - 465 -  HDL >68 mg/dL 59 - - 51 -  Calc LDL 0 - 99 mg/dL 127(N) - - 40 -  Triglycerides 0 - 149 mg/dL 170 - - 86 -  Creatinine 0.57 - 1.00 mg/dL 0.17 4.94 4.96 7.59 1.63   BP/Weight 10/10/2020 07/11/2020 06/05/2020 02/29/2020 10/17/2019 05/15/2019 01/31/2019  Systolic BP 120 121 118 131 128 130 114  Diastolic BP 78 72 69 76 84 84 84  Wt. (Lbs) 241.4 235 232 224 225 235 235  BMI 35.65 34.7 34.26 34.06 34.21 34.7 35.73   Foot/eye exam completion dates Latest Ref Rng & Units 09/17/2020 06/28/2019  Eye Exam No Retinopathy Retinopathy(A) No Retinopathy  Foot exam Order - - -  Foot Form Completion - - -

## 2020-10-10 NOTE — Patient Instructions (Addendum)
F/U in 4.5 months, call if you need me sooner, shingrix #2 at  visit  Fasting lipid, cmp and eGFR 5 days before next visit   Nurse visit for shingrix # 1 in 1 month  You are being referred to Endo for management of your diabetes  Please get covid booster, overdue   Start testing 3 times daily and record Goal for fasting blood sugar ranges from 80 to 120 and 2 hours after any meal or at bedtime should be between 130 to 170.   It is important that you exercise regularly at least 30 minutes 5 times a week. If you develop chest pain, have severe difficulty breathing, or feel very tired, stop exercising immediately and seek medical attention  Think about what you will eat, plan ahead. Choose " clean, green, fresh or frozen" over canned, processed or packaged foods which are more sugary, salty and fatty. 70 to 75% of food eaten should be vegetables and fruit. Three meals at set times with snacks allowed between meals, but they must be fruit or vegetables. Aim to eat over a 12 hour period , example 7 am to 7 pm, and STOP after  your last meal of the day. Drink water,generally about 64 ounces per day, no other drink is as healthy. Fruit juice is best enjoyed in a healthy way, by EATING the fruit. Thanks for choosing [facility], we consider it a privelige to serve you.   T

## 2020-10-10 NOTE — Assessment & Plan Note (Signed)

## 2020-10-10 NOTE — Progress Notes (Signed)
Kelly Savage     MRN: [id number]      DOB: 1955-08-01  HPI: Patient is in for annual physical exam. Uncontrolled diabetes is discussed and she is referred to Endo. Recent labs,  are reviewed. Immunization is reviewed , and  updated   PE: BP 120/78   Pulse 73   Temp 97.8 F (36.6 C) (Oral)   Resp 18   Ht 5\' 9"  (1.753 m)   Wt 241 lb 6.4 oz (109.5 kg)   SpO2 98%   BMI 35.65 kg/m   Pleasant  female, alert and oriented x 3, in no cardio-pulmonary distress. Afebrile. HEENT No facial trauma or asymetry. Sinuses non tender.  Extra occullar muscles intact.. External ears normal, . Neck: supple, no adenopathy,JVD or thyromegaly.No bruits.  Chest: Clear to ascultation bilaterally.No crackles or wheezes. Non tender to palpation  Breast: Asymptomatic, not examined  Cardiovascular system; Heart sounds normal,  S1 and  S2 ,no S3.  No murmur, or thrill.   Abdomen: Soft, non tender.       Musculoskeletal exam: Decreased  ROM of lumbar spine, adequate in hips , shoulders and knees. No deformity ,swelling or crepitus noted. No muscle wasting or atrophy.   Neurologic: Cranial nerves 2 to 12 intact. Power, tone ,sensation  normal throughout. No disturbance in gait. No tremor.  Skin: Intact, no ulceration, erythema , scaling or rash noted. Pigmentation normal throughout  Psych; Normal mood and affect. Judgement and concentration normal   Assessment & Plan:  Annual physical exam Annual exam as documented. Counseling done  re healthy lifestyle involving commitment to 150 minutes exercise per week, heart healthy diet, and attaining healthy weight.The importance of adequate sleep also discussed. Regular seat belt use and home safety, is also discussed. Changes in health habits are decided on by the patient with goals and time frames  set for achieving them. Immunization and cancer screening needs are specifically addressed at this visit.   Type 2 diabetes  mellitus with hyperglycemia (HCC) Uncontrolled and deteriorated, refer to endo Kelly Savage is reminded of the importance of commitment to daily physical activity for 30 minutes or more, as able and the need to limit carbohydrate intake to 30 to 60 grams per meal to help with blood sugar control.   The need to take medication as prescribed, test blood sugar as directed, and to call between visits if there is a concern that blood sugar is uncontrolled is also discussed.   Kelly Savage is reminded of the importance of daily foot exam, annual eye examination, and good blood sugar, blood pressure and cholesterol control.  Diabetic Labs Latest Ref Rng & Units 06/03/2020 02/28/2020 10/06/2019 05/04/2019 01/30/2019  HbA1c 4.8 - 5.6 % 9.5(H) 9.9(H) 8.9(H) 7.6(H) 8.6(H)  Microalbumin mg/dL - - - - -  Micro/Creat Ratio 0 - 29 mg/g creat <6 - - - 5  Chol 100 - 199 mg/dL 02/01/2019 - - 097 -  HDL 353 mg/dL 59 - - 51 -  Calc LDL 0 - 99 mg/dL >29) - - 40 -  Triglycerides 0 - 149 mg/dL 924(Q - - 86 -  Creatinine 0.57 - 1.00 mg/dL 03-24-2006 4.19 6.22 2.97 9.89   BP/Weight 10/10/2020 07/11/2020 06/05/2020 02/29/2020 10/17/2019 05/15/2019 01/31/2019  Systolic BP 120 121 118 131 128 130 114  Diastolic BP 78 72 69 76 84 84 84  Wt. (Lbs) 241.4 235 232 224 225 235 235  BMI 35.65 34.7 34.26 34.06 34.21 34.7 35.73  Foot/eye exam completion dates Latest Ref Rng & Units 09/17/2020 06/28/2019  Eye Exam No Retinopathy Retinopathy(A) No Retinopathy  Foot exam Order - - -  Foot Form Completion - - -        Need for Tdap vaccination After obtaining informed consent, the vaccine is  administered , with no adverse effect noted at the time of administration.   Obesity (BMI 30.0-34.9)  Patient re-educated about  the importance of commitment to a  minimum of 150 minutes of exercise per week as able.  The importance of healthy food choices with portion control discussed, as well as eating regularly and within a 12 hour window most days. The  need to choose "clean , green" food 50 to 75% of the time is discussed, as well as to make water the primary drink and set a goal of 64 ounces water daily.    Weight /BMI 10/10/2020 07/11/2020 06/05/2020  WEIGHT 241 lb 6.4 oz 235 lb 232 lb  HEIGHT 5\' 9"  5\' 9"  5\' 9"   BMI 35.65 kg/m2 34.7 kg/m2 34.26 kg/m2

## 2020-10-10 NOTE — Progress Notes (Signed)
tt

## 2020-10-11 ENCOUNTER — Other Ambulatory Visit (HOSPITAL_COMMUNITY): Payer: Self-pay

## 2020-10-11 DIAGNOSIS — Z23 Encounter for immunization: Secondary | ICD-10-CM | POA: Insufficient documentation

## 2020-10-11 NOTE — Assessment & Plan Note (Signed)
After obtaining informed consent, the vaccine is  administered , with no adverse effect noted at the time of administration.  

## 2020-10-11 NOTE — Assessment & Plan Note (Signed)
  Patient re-educated about  the importance of commitment to a  minimum of 150 minutes of exercise per week as able.  The importance of healthy food choices with portion control discussed, as well as eating regularly and within a 12 hour window most days. The need to choose "clean , green" food 50 to 75% of the time is discussed, as well as to make water the primary drink and set a goal of 64 ounces water daily.    Weight /BMI 10/10/2020 07/11/2020 06/05/2020  WEIGHT 241 lb 6.4 oz 235 lb 232 lb  HEIGHT 5\' 9"  5\' 9"  5\' 9"   BMI 35.65 kg/m2 34.7 kg/m2 34.26 kg/m2

## 2020-10-15 ENCOUNTER — Encounter: Payer: Self-pay | Admitting: Family Medicine

## 2020-10-16 ENCOUNTER — Encounter: Payer: Self-pay | Admitting: Family Medicine

## 2020-10-17 ENCOUNTER — Other Ambulatory Visit: Payer: Self-pay

## 2020-10-17 DIAGNOSIS — E1159 Type 2 diabetes mellitus with other circulatory complications: Secondary | ICD-10-CM

## 2020-10-21 NOTE — Telephone Encounter (Signed)
Ok great! Let us know if you need anything else.   Jerilynn Mages, LPN

## 2020-10-29 ENCOUNTER — Encounter: Payer: Self-pay | Admitting: Family Medicine

## 2020-10-30 ENCOUNTER — Other Ambulatory Visit: Payer: Self-pay

## 2020-10-30 DIAGNOSIS — E1159 Type 2 diabetes mellitus with other circulatory complications: Secondary | ICD-10-CM

## 2020-10-30 DIAGNOSIS — E669 Obesity, unspecified: Secondary | ICD-10-CM

## 2020-11-06 MED FILL — Insulin Pen Needle 31 G X 8 MM (1/3" or 5/16"): 90 days supply | Qty: 100 | Fill #1 | Status: AC

## 2020-11-06 MED FILL — Diclofenac Sodium Tab Delayed Release 75 MG: ORAL | 30 days supply | Qty: 60 | Fill #1 | Status: AC

## 2020-11-07 ENCOUNTER — Other Ambulatory Visit (HOSPITAL_COMMUNITY): Payer: Self-pay

## 2020-11-14 ENCOUNTER — Ambulatory Visit: Payer: No Typology Code available for payment source | Attending: Internal Medicine

## 2020-11-14 ENCOUNTER — Other Ambulatory Visit: Payer: Self-pay

## 2020-11-14 ENCOUNTER — Other Ambulatory Visit (HOSPITAL_BASED_OUTPATIENT_CLINIC_OR_DEPARTMENT_OTHER): Payer: Self-pay

## 2020-11-14 DIAGNOSIS — Z23 Encounter for immunization: Secondary | ICD-10-CM

## 2020-11-14 MED ORDER — PFIZER-BIONT COVID-19 VAC-TRIS 30 MCG/0.3ML IM SUSP
INTRAMUSCULAR | 0 refills | Status: DC
  Filled 2020-11-14: qty 0.3, 1d supply, fill #0

## 2020-11-14 NOTE — Progress Notes (Signed)
   Covid-19 Vaccination Clinic  Name:  Kelly Savage    MRN: [id number] DOB: 1956/03/22  11/14/2020  [name] was observed post Covid-19 immunization for 15 minutes without incident. She was provided with Vaccine Information Sheet and instruction to access the V-Safe system.   [name] was instructed to call 911 with any severe reactions post vaccine: Difficulty breathing  Swelling of face and throat  A fast heartbeat  A bad rash all over body  Dizziness and weakness   Immunizations Administered     Name Date Dose VIS Date Route   PFIZER Comrnaty(Gray TOP) Covid-19 Vaccine 11/14/2020 11:58 AM 0.3 mL 04/11/2020 Intramuscular   Manufacturer: [employer], [employer]   Lot: [id number]   NDC: [phone]

## 2020-11-19 ENCOUNTER — Ambulatory Visit: Payer: No Typology Code available for payment source

## 2020-11-21 ENCOUNTER — Other Ambulatory Visit (HOSPITAL_COMMUNITY): Payer: Self-pay

## 2020-11-21 MED FILL — Glipizide Tab ER 24HR 10 MG: ORAL | 90 days supply | Qty: 180 | Fill #1 | Status: AC

## 2020-12-12 ENCOUNTER — Ambulatory Visit: Payer: No Typology Code available for payment source | Admitting: Family Medicine

## 2020-12-19 ENCOUNTER — Other Ambulatory Visit: Payer: Self-pay

## 2020-12-19 ENCOUNTER — Ambulatory Visit (INDEPENDENT_AMBULATORY_CARE_PROVIDER_SITE_OTHER): Payer: No Typology Code available for payment source

## 2020-12-19 DIAGNOSIS — Z23 Encounter for immunization: Secondary | ICD-10-CM

## 2020-12-19 NOTE — Progress Notes (Signed)
Shingrix 1 given without complication

## 2020-12-24 ENCOUNTER — Other Ambulatory Visit: Payer: Self-pay | Admitting: Family Medicine

## 2020-12-24 ENCOUNTER — Other Ambulatory Visit (HOSPITAL_COMMUNITY): Payer: Self-pay

## 2020-12-24 MED ORDER — METFORMIN HCL 500 MG PO TABS
1000.0000 mg | ORAL_TABLET | Freq: Two times a day (BID) | ORAL | 1 refills | Status: DC
  Filled 2020-12-24: qty 88, 22d supply, fill #0
  Filled 2020-12-24: qty 272, 68d supply, fill #0

## 2021-01-02 ENCOUNTER — Other Ambulatory Visit (HOSPITAL_COMMUNITY): Payer: Self-pay

## 2021-01-02 MED ORDER — MOUNJARO 2.5 MG/0.5ML ~~LOC~~ SOAJ
2.5000 mg | SUBCUTANEOUS | 0 refills | Status: DC
  Filled 2021-01-02: qty 2, 28d supply, fill #0

## 2021-01-02 MED ORDER — MOUNJARO 2.5 MG/0.5ML ~~LOC~~ SOAJ
0.2500 mg | SUBCUTANEOUS | 0 refills | Status: DC
  Filled 2021-01-02: qty 2, 28d supply, fill #0

## 2021-01-07 ENCOUNTER — Other Ambulatory Visit: Payer: Self-pay

## 2021-01-07 ENCOUNTER — Ambulatory Visit
Admission: RE | Admit: 2021-01-07 | Discharge: 2021-01-07 | Disposition: A | Discharge status: Home / Self Care | Payer: No Typology Code available for payment source | Source: Ambulatory Visit | Attending: Family Medicine | Admitting: Family Medicine

## 2021-01-07 DIAGNOSIS — Z1231 Encounter for screening mammogram for malignant neoplasm of breast: Secondary | ICD-10-CM

## 2021-01-10 ENCOUNTER — Other Ambulatory Visit (HOSPITAL_COMMUNITY): Payer: Self-pay

## 2021-01-10 ENCOUNTER — Other Ambulatory Visit: Payer: Self-pay | Admitting: Family Medicine

## 2021-01-10 MED ORDER — DICLOFENAC SODIUM 75 MG PO TBEC
75.0000 mg | DELAYED_RELEASE_TABLET | Freq: Every day | ORAL | 3 refills | Status: DC | PRN
  Filled 2021-01-10: qty 60, 60d supply, fill #0
  Filled 2021-03-21: qty 60, 60d supply, fill #1
  Filled 2021-05-20: qty 60, 60d supply, fill #2
  Filled 2021-07-24: qty 60, 60d supply, fill #3

## 2021-01-13 ENCOUNTER — Other Ambulatory Visit (HOSPITAL_COMMUNITY): Payer: Self-pay

## 2021-01-13 MED ORDER — MOUNJARO 5 MG/0.5ML ~~LOC~~ SOAJ
5.0000 mg | SUBCUTANEOUS | 1 refills | Status: DC
  Filled 2021-01-13 – 2021-01-15 (×2): qty 2, 28d supply, fill #0

## 2021-01-14 ENCOUNTER — Other Ambulatory Visit (HOSPITAL_COMMUNITY): Payer: Self-pay

## 2021-01-14 MED ORDER — MOUNJARO 5 MG/0.5ML ~~LOC~~ SOAJ
5.0000 mg | SUBCUTANEOUS | 1 refills | Status: DC
  Filled 2021-01-14 – 2021-01-24 (×2): qty 2, 28d supply, fill #0
  Filled 2021-02-17: qty 2, 28d supply, fill #1

## 2021-01-15 ENCOUNTER — Other Ambulatory Visit (HOSPITAL_COMMUNITY): Payer: Self-pay

## 2021-01-24 ENCOUNTER — Other Ambulatory Visit (HOSPITAL_COMMUNITY): Payer: Self-pay

## 2021-02-18 ENCOUNTER — Other Ambulatory Visit (HOSPITAL_COMMUNITY): Payer: Self-pay

## 2021-02-21 ENCOUNTER — Other Ambulatory Visit (HOSPITAL_COMMUNITY): Payer: Self-pay

## 2021-03-02 ENCOUNTER — Encounter: Payer: Self-pay | Admitting: Family Medicine

## 2021-03-03 ENCOUNTER — Other Ambulatory Visit: Payer: Self-pay

## 2021-03-03 DIAGNOSIS — E1159 Type 2 diabetes mellitus with other circulatory complications: Secondary | ICD-10-CM

## 2021-03-05 ENCOUNTER — Ambulatory Visit (INDEPENDENT_AMBULATORY_CARE_PROVIDER_SITE_OTHER): Payer: No Typology Code available for payment source | Admitting: Family Medicine

## 2021-03-05 ENCOUNTER — Other Ambulatory Visit: Payer: Self-pay

## 2021-03-05 ENCOUNTER — Other Ambulatory Visit (HOSPITAL_COMMUNITY): Payer: Self-pay

## 2021-03-05 VITALS — BP 136/80 | HR 79 | Resp 18 | Ht 69.0 in | Wt 217.4 lb

## 2021-03-05 DIAGNOSIS — R030 Elevated blood-pressure reading, without diagnosis of hypertension: Secondary | ICD-10-CM | POA: Diagnosis not present

## 2021-03-05 DIAGNOSIS — E669 Obesity, unspecified: Secondary | ICD-10-CM

## 2021-03-05 DIAGNOSIS — Z23 Encounter for immunization: Secondary | ICD-10-CM | POA: Diagnosis not present

## 2021-03-05 DIAGNOSIS — Z780 Asymptomatic menopausal state: Secondary | ICD-10-CM

## 2021-03-05 DIAGNOSIS — M5410 Radiculopathy, site unspecified: Secondary | ICD-10-CM

## 2021-03-05 DIAGNOSIS — E1159 Type 2 diabetes mellitus with other circulatory complications: Secondary | ICD-10-CM | POA: Diagnosis not present

## 2021-03-05 DIAGNOSIS — E785 Hyperlipidemia, unspecified: Secondary | ICD-10-CM

## 2021-03-05 LAB — CMP14+EGFR
ALT: 23 IU/L (ref 0–32)
AST: 22 IU/L (ref 0–40)
Albumin/Globulin Ratio: 2.3 — ABNORMAL HIGH (ref 1.2–2.2)
Albumin: 4.5 g/dL (ref 3.8–4.8)
Alkaline Phosphatase: 67 IU/L (ref 44–121)
BUN/Creatinine Ratio: 12 (ref 12–28)
BUN: 9 mg/dL (ref 8–27)
Bilirubin Total: 1.2 mg/dL (ref 0.0–1.2)
CO2: 23 mmol/L (ref 20–29)
Calcium: 9.3 mg/dL (ref 8.7–10.3)
Chloride: 102 mmol/L (ref 96–106)
Creatinine, Ser: 0.77 mg/dL (ref 0.57–1.00)
Globulin, Total: 2 g/dL (ref 1.5–4.5)
Glucose: 125 mg/dL — ABNORMAL HIGH (ref 70–99)
Potassium: 3.6 mmol/L (ref 3.5–5.2)
Sodium: 140 mmol/L (ref 134–144)
Total Protein: 6.5 g/dL (ref 6.0–8.5)
eGFR: 86 mL/min/{1.73_m2} (ref >59)

## 2021-03-05 LAB — LIPID PANEL
Chol/HDL Ratio: 2.1 ratio (ref 0.0–4.4)
Cholesterol, Total: 98 mg/dL — ABNORMAL LOW (ref 100–199)
HDL: 46 mg/dL (ref >39)
LDL Chol Calc (NIH): 36 mg/dL (ref 0–99)
Triglycerides: 80 mg/dL (ref 0–149)
VLDL Cholesterol Cal: 16 mg/dL (ref 5–40)

## 2021-03-05 LAB — HEMOGLOBIN A1C
Est. average glucose Bld gHb Est-mCnc: 186 mg/dL
Hgb A1c MFr Bld: 8.1 % — ABNORMAL HIGH (ref 4.8–5.6)

## 2021-03-05 MED ORDER — ROSUVASTATIN CALCIUM 10 MG PO TABS
10.0000 mg | ORAL_TABLET | Freq: Every day | ORAL | 1 refills | Status: DC
  Filled 2021-03-05: qty 90, 90d supply, fill #0

## 2021-03-05 NOTE — Patient Instructions (Addendum)
F/u in first week in March, call if you need me sooner  Fasting lipid, cmp and eGFr and microalb, cBC, TSH and vit D 5 days before visit  Pneumonia 20 and Shingrix  #2 today  Congrats omn markedly improved health Good foot exam Please schedule dexa at [facility] at checkout   Lower dose crestor 10 mg daily, OK to take 20 mg 4 times / week till done if you wish   Please get latest covid vaccine at your pharmacy  Start benazepril once every day   Thankful you are mUCH better!

## 2021-03-08 ENCOUNTER — Encounter: Payer: Self-pay | Admitting: Family Medicine

## 2021-03-08 DIAGNOSIS — R030 Elevated blood-pressure reading, without diagnosis of hypertension: Secondary | ICD-10-CM | POA: Insufficient documentation

## 2021-03-08 NOTE — Assessment & Plan Note (Signed)
Hyperlipidemia:Low fat diet discussed and encouraged.   Lipid Panel  Lab Results  Component Value Date   CHOL 98 (L) 03/04/2021   HDL 46 03/04/2021   LDLCALC 36 03/04/2021   TRIG 80 03/04/2021   CHOLHDL 2.1 03/04/2021     Reduce statin dose

## 2021-03-08 NOTE — Assessment & Plan Note (Signed)
Kelly Savage is reminded of the importance of commitment to daily physical activity for 30 minutes or more, as able and the need to limit carbohydrate intake to 30 to 60 grams per meal to help with blood sugar control.   The need to take medication as prescribed, test blood sugar as directed, and to call between visits if there is a concern that blood sugar is uncontrolled is also discussed.   Kelly Savage is reminded of the importance of daily foot exam, annual eye examination, and good blood sugar, blood pressure and cholesterol control. Improving , management per Endo Diabetic Labs Latest Ref Rng & Units 03/04/2021 10/09/2020 06/03/2020 02/28/2020 10/06/2019  HbA1c 4.8 - 5.6 % 8.1(H) 8.9(H) 9.5(H) 9.9(H) 8.9(H)  Microalbumin mg/dL - - - - -  Micro/Creat Ratio 0 - 29 mg/g creat - - <6 - -  Chol 100 - 199 mg/dL 53(I) - 144 - -  HDL >31 mg/dL 46 - 59 - -  Calc LDL 0 - 99 mg/dL 36 - 540(G) - -  Triglycerides 0 - 149 mg/dL 80 - 867 - -  Creatinine 0.57 - 1.00 mg/dL 6.19 5.09 3.26 7.12 4.58   BP/Weight 03/05/2021 10/10/2020 07/11/2020 06/05/2020 02/29/2020 10/17/2019 05/15/2019  Systolic BP 136 120 121 118 131 128 130  Diastolic BP 80 78 72 69 76 84 84  Wt. (Lbs) 217.4 241.4 235 232 224 225 235  BMI 32.1 35.65 34.7 34.26 34.06 34.21 34.7   Foot/eye exam completion dates Latest Ref Rng & Units 09/17/2020 06/28/2019  Eye Exam No Retinopathy Retinopathy(A) No Retinopathy  Foot exam Order - - -  Foot Form Completion - - -

## 2021-03-08 NOTE — Assessment & Plan Note (Signed)
Improved with weight loss and therapy

## 2021-03-08 NOTE — Assessment & Plan Note (Signed)
Improved, congratulated on this  Patient re-educated about  the importance of commitment to a  minimum of 150 minutes of exercise per week as able.  The importance of healthy food choices with portion control discussed, as well as eating regularly and within a 12 hour window most days. The need to choose "clean , green" food 50 to 75% of the time is discussed, as well as to make water the primary drink and set a goal of 64 ounces water daily.    Weight /BMI 03/05/2021 10/10/2020 07/11/2020  WEIGHT 217 lb 6.4 oz 241 lb 6.4 oz 235 lb  HEIGHT 5\' 9"  5\' 9"  5\' 9"   BMI 32.1 kg/m2 35.65 kg/m2 34.7 kg/m2

## 2021-03-08 NOTE — Assessment & Plan Note (Signed)
DASH diet and commitment to daily physical activity for a minimum of 30 minutes discussed and encouraged, as a part of hypertension management. The importance of attaining a healthy weight is also discussed.  BP/Weight 03/05/2021 10/10/2020 07/11/2020 06/05/2020 02/29/2020 10/17/2019 05/15/2019  Systolic BP 136 120 121 118 131 128 130  Diastolic BP 80 78 72 69 76 84 84  Wt. (Lbs) 217.4 241.4 235 232 224 225 235  BMI 32.1 35.65 34.7 34.26 34.06 34.21 34.7     Commit to daily benazepril weight losss and reduced salt intake

## 2021-03-08 NOTE — Progress Notes (Signed)
Kelly Savage     MRN: [id number]      DOB: 1955/06/14   HPI Kelly Savage is here for follow up and re-evaluation of chronic medical conditions, medication management and review of any available recent lab and radiology data.  Preventive health is updated, specifically  Cancer screening and Immunization.   Being treated by Endo for diabetes, great response to mounjaro good blood sugars, significant weight loss, less insulin The PT denies any adverse reactions to current medications since the last visit.  There are no new concerns.  There are no specific complaints  Denies polyuria, polydipsia, blurred vision , or hypoglycemic episodes.   ROS Denies recent fever or chills. Denies sinus pressure, nasal congestion, ear pain or sore throat. Denies chest congestion, productive cough or wheezing. Denies chest pains, palpitations and leg swelling Denies abdominal pain, nausea, vomiting,diarrhea or constipation.   Denies dysuria, frequency, hesitancy or incontinence. Denies uncontrolled joint pain, swelling and limitation in mobility. Denies headaches, seizures, numbness, or tingling. Denies depression, anxiety or insomnia. Denies skin break down or rash.   PE  BP 136/80   Pulse 79   Resp 18   Ht 5\' 9"  (1.753 m)   Wt 217 lb 6.4 oz (98.6 kg)   SpO2 95%   BMI 32.10 kg/m   Patient alert and oriented and in no cardiopulmonary distress.  HEENT: No facial asymmetry, EOMI,     Neck supple .  Chest: Clear to auscultation bilaterally.  CVS: S1, S2 no murmurs, no S3.Regular rate.  ABD: Soft non tender.   Ext: No edema  MS: Adequate though reduced  ROM spine, shoulders, hips and knees.  Skin: Intact, no ulcerations or rash noted.  Psych: Good eye contact, normal affect. Memory intact not anxious or depressed appearing.  CNS: CN 2-12 intact, power,  normal throughout.no focal deficits noted.   Assessment & Plan  Type 2 diabetes mellitus with vascular disease (HCC) Kelly Savage is  reminded of the importance of commitment to daily physical activity for 30 minutes or more, as able and the need to limit carbohydrate intake to 30 to 60 grams per meal to help with blood sugar control.   The need to take medication as prescribed, test blood sugar as directed, and to call between visits if there is a concern that blood sugar is uncontrolled is also discussed.   Kelly Savage is reminded of the importance of daily foot exam, annual eye examination, and good blood sugar, blood pressure and cholesterol control. Improving , management per Endo Diabetic Labs Latest Ref Rng & Units 03/04/2021 10/09/2020 06/03/2020 02/28/2020 10/06/2019  HbA1c 4.8 - 5.6 % 8.1(H) 8.9(H) 9.5(H) 9.9(H) 8.9(H)  Microalbumin mg/dL - - - - -  Micro/Creat Ratio 0 - 29 mg/g creat - - <6 - -  Chol 100 - 199 mg/dL 12/06/2019) - 67(M - -  HDL 094 mg/dL 46 - 59 - -  Calc LDL 0 - 99 mg/dL 36 - >70) - -  Triglycerides 0 - 149 mg/dL 80 - 962(E - -  Creatinine 0.57 - 1.00 mg/dL 366 2.94 7.65 4.65 0.35   BP/Weight 03/05/2021 10/10/2020 07/11/2020 06/05/2020 02/29/2020 10/17/2019 05/15/2019  Systolic BP 136 120 121 118 131 128 130  Diastolic BP 80 78 72 69 76 84 84  Wt. (Lbs) 217.4 241.4 235 232 224 225 235  BMI 32.1 35.65 34.7 34.26 34.06 34.21 34.7   Foot/eye exam completion dates Latest Ref Rng & Units 09/17/2020 06/28/2019  Eye Exam No Retinopathy  Retinopathy(A) No Retinopathy  Foot exam Order - - -  Foot Form Completion - - -        Back pain with left-sided radiculopathy Improved with weight loss and therapy  Hyperlipidemia with target LDL less than 100 Hyperlipidemia:Low fat diet discussed and encouraged.   Lipid Panel  Lab Results  Component Value Date   CHOL 98 (L) 03/04/2021   HDL 46 03/04/2021   LDLCALC 36 03/04/2021   TRIG 80 03/04/2021   CHOLHDL 2.1 03/04/2021     Reduce statin dose   Obesity (BMI 30.0-34.9) Improved, congratulated on this  Patient re-educated about  the importance of commitment to a   minimum of 150 minutes of exercise per week as able.  The importance of healthy food choices with portion control discussed, as well as eating regularly and within a 12 hour window most days. The need to choose "clean , green" food 50 to 75% of the time is discussed, as well as to make water the primary drink and set a goal of 64 ounces water daily.    Weight /BMI 03/05/2021 10/10/2020 07/11/2020  WEIGHT 217 lb 6.4 oz 241 lb 6.4 oz 235 lb  HEIGHT 5\' 9"  5\' 9"  5\' 9"   BMI 32.1 kg/m2 35.65 kg/m2 34.7 kg/m2      Prehypertension DASH diet and commitment to daily physical activity for a minimum of 30 minutes discussed and encouraged, as a part of hypertension management. The importance of attaining a healthy weight is also discussed.  BP/Weight 03/05/2021 10/10/2020 07/11/2020 06/05/2020 02/29/2020 10/17/2019 05/15/2019  Systolic BP 136 120 121 118 131 128 130  Diastolic BP 80 78 72 69 76 84 84  Wt. (Lbs) 217.4 241.4 235 232 224 225 235  BMI 32.1 35.65 34.7 34.26 34.06 34.21 34.7     Commit to daily benazepril weight losss and reduced salt intake

## 2021-03-17 ENCOUNTER — Other Ambulatory Visit (HOSPITAL_COMMUNITY): Payer: Self-pay

## 2021-03-17 MED ORDER — MOUNJARO 7.5 MG/0.5ML ~~LOC~~ SOAJ
7.5000 mg | SUBCUTANEOUS | 3 refills | Status: DC
  Filled 2021-03-17: qty 2, 28d supply, fill #0
  Filled 2021-04-13: qty 2, 28d supply, fill #1

## 2021-03-19 ENCOUNTER — Other Ambulatory Visit (HOSPITAL_COMMUNITY): Payer: Self-pay

## 2021-03-21 ENCOUNTER — Other Ambulatory Visit (HOSPITAL_COMMUNITY): Payer: Self-pay

## 2021-03-24 ENCOUNTER — Other Ambulatory Visit (HOSPITAL_COMMUNITY): Payer: Self-pay

## 2021-03-25 ENCOUNTER — Encounter: Payer: Self-pay | Admitting: Family Medicine

## 2021-03-25 ENCOUNTER — Other Ambulatory Visit (HOSPITAL_COMMUNITY): Payer: Self-pay

## 2021-03-25 ENCOUNTER — Other Ambulatory Visit: Payer: Self-pay

## 2021-03-25 ENCOUNTER — Other Ambulatory Visit: Payer: Self-pay | Admitting: Family Medicine

## 2021-03-25 MED ORDER — GLIPIZIDE ER 10 MG PO TB24
20.0000 mg | ORAL_TABLET | Freq: Every morning | ORAL | 2 refills | Status: DC
  Filled 2021-03-25: qty 180, 90d supply, fill #0

## 2021-03-25 MED ORDER — BENAZEPRIL HCL 5 MG PO TABS
5.0000 mg | ORAL_TABLET | Freq: Every day | ORAL | 1 refills | Status: DC
  Filled 2021-03-25: qty 90, 90d supply, fill #0
  Filled 2021-07-04: qty 90, 90d supply, fill #1

## 2021-03-25 MED ORDER — ROSUVASTATIN CALCIUM 10 MG PO TABS
10.0000 mg | ORAL_TABLET | Freq: Every day | ORAL | 1 refills | Status: DC
  Filled 2021-03-25: qty 90, 90d supply, fill #0

## 2021-03-25 MED ORDER — GLIPIZIDE ER 10 MG PO TB24
20.0000 mg | ORAL_TABLET | Freq: Every morning | ORAL | 1 refills | Status: DC
  Filled 2021-03-25 – 2021-04-11 (×2): qty 180, 90d supply, fill #0
  Filled 2021-11-03: qty 180, 90d supply, fill #1

## 2021-04-11 ENCOUNTER — Other Ambulatory Visit (HOSPITAL_COMMUNITY): Payer: Self-pay

## 2021-04-14 ENCOUNTER — Other Ambulatory Visit (HOSPITAL_COMMUNITY): Payer: Self-pay

## 2021-04-15 ENCOUNTER — Other Ambulatory Visit (HOSPITAL_COMMUNITY): Payer: Self-pay

## 2021-04-16 ENCOUNTER — Other Ambulatory Visit (HOSPITAL_COMMUNITY): Payer: Self-pay

## 2021-04-17 ENCOUNTER — Other Ambulatory Visit (HOSPITAL_COMMUNITY): Payer: Self-pay

## 2021-04-17 MED ORDER — MOUNJARO 10 MG/0.5ML ~~LOC~~ SOAJ
10.0000 mg | SUBCUTANEOUS | 6 refills | Status: DC
  Filled 2021-04-17: qty 2, 28d supply, fill #0
  Filled 2021-05-11: qty 2, 28d supply, fill #1
  Filled 2021-06-08 – 2021-08-11 (×2): qty 2, 28d supply, fill #2
  Filled 2021-09-02: qty 2, 28d supply, fill #3

## 2021-04-29 ENCOUNTER — Other Ambulatory Visit (HOSPITAL_COMMUNITY): Payer: Self-pay

## 2021-04-29 MED ORDER — GLIPIZIDE 5 MG PO TABS
5.0000 mg | ORAL_TABLET | Freq: Every day | ORAL | 1 refills | Status: DC
  Filled 2021-04-29: qty 30, 30d supply, fill #0

## 2021-04-30 ENCOUNTER — Other Ambulatory Visit (HOSPITAL_COMMUNITY): Payer: Self-pay

## 2021-05-12 ENCOUNTER — Other Ambulatory Visit (HOSPITAL_COMMUNITY): Payer: Self-pay

## 2021-05-13 ENCOUNTER — Other Ambulatory Visit (HOSPITAL_COMMUNITY): Payer: Self-pay

## 2021-05-20 ENCOUNTER — Other Ambulatory Visit: Payer: Self-pay | Admitting: Family Medicine

## 2021-05-21 ENCOUNTER — Other Ambulatory Visit (HOSPITAL_COMMUNITY): Payer: Self-pay

## 2021-05-21 MED ORDER — METFORMIN HCL 500 MG PO TABS
1000.0000 mg | ORAL_TABLET | Freq: Two times a day (BID) | ORAL | 1 refills | Status: DC
  Filled 2021-05-21: qty 180, 45d supply, fill #0
  Filled 2021-07-04: qty 180, 45d supply, fill #1

## 2021-05-22 ENCOUNTER — Other Ambulatory Visit (HOSPITAL_COMMUNITY): Payer: Self-pay

## 2021-06-09 ENCOUNTER — Other Ambulatory Visit (HOSPITAL_COMMUNITY): Payer: Self-pay

## 2021-06-10 ENCOUNTER — Other Ambulatory Visit (HOSPITAL_COMMUNITY): Payer: Self-pay

## 2021-06-12 ENCOUNTER — Other Ambulatory Visit (HOSPITAL_COMMUNITY): Payer: Self-pay

## 2021-06-13 ENCOUNTER — Other Ambulatory Visit (HOSPITAL_COMMUNITY): Payer: Self-pay

## 2021-06-13 MED ORDER — MOUNJARO 7.5 MG/0.5ML ~~LOC~~ SOAJ
7.5000 mg | SUBCUTANEOUS | 6 refills | Status: DC
  Filled 2021-06-13: qty 2, 28d supply, fill #0
  Filled 2021-07-06: qty 2, 28d supply, fill #1
  Filled 2021-08-03: qty 2, 28d supply, fill #2

## 2021-06-17 ENCOUNTER — Other Ambulatory Visit (HOSPITAL_COMMUNITY): Payer: Self-pay

## 2021-06-27 ENCOUNTER — Other Ambulatory Visit (HOSPITAL_COMMUNITY): Payer: Self-pay

## 2021-07-02 ENCOUNTER — Ambulatory Visit: Payer: No Typology Code available for payment source | Admitting: Family Medicine

## 2021-07-03 LAB — CMP14+EGFR
ALT: 18 IU/L (ref 0–32)
AST: 21 IU/L (ref 0–40)
Albumin/Globulin Ratio: 2.1 (ref 1.2–2.2)
Albumin: 4.4 g/dL (ref 3.8–4.8)
Alkaline Phosphatase: 64 IU/L (ref 44–121)
BUN/Creatinine Ratio: 15 (ref 12–28)
BUN: 13 mg/dL (ref 8–27)
Bilirubin Total: 0.8 mg/dL (ref 0.0–1.2)
CO2: 24 mmol/L (ref 20–29)
Calcium: 9.3 mg/dL (ref 8.7–10.3)
Chloride: 101 mmol/L (ref 96–106)
Creatinine, Ser: 0.85 mg/dL (ref 0.57–1.00)
Globulin, Total: 2.1 g/dL (ref 1.5–4.5)
Glucose: 92 mg/dL (ref 70–99)
Potassium: 3.6 mmol/L (ref 3.5–5.2)
Sodium: 139 mmol/L (ref 134–144)
Total Protein: 6.5 g/dL (ref 6.0–8.5)
eGFR: 76 mL/min/{1.73_m2} (ref >59)

## 2021-07-03 LAB — LIPID PANEL
Chol/HDL Ratio: 3.9 ratio (ref 0.0–4.4)
Cholesterol, Total: 193 mg/dL (ref 100–199)
HDL: 50 mg/dL (ref >39)
LDL Chol Calc (NIH): 127 mg/dL — ABNORMAL HIGH (ref 0–99)
Triglycerides: 87 mg/dL (ref 0–149)
VLDL Cholesterol Cal: 16 mg/dL (ref 5–40)

## 2021-07-03 LAB — HEMOGLOBIN A1C
Est. average glucose Bld gHb Est-mCnc: 143 mg/dL
Hgb A1c MFr Bld: 6.6 % — ABNORMAL HIGH (ref 4.8–5.6)

## 2021-07-04 ENCOUNTER — Ambulatory Visit: Payer: No Typology Code available for payment source | Admitting: Family Medicine

## 2021-07-04 ENCOUNTER — Other Ambulatory Visit: Payer: Self-pay

## 2021-07-04 ENCOUNTER — Other Ambulatory Visit (HOSPITAL_COMMUNITY): Payer: Self-pay

## 2021-07-04 ENCOUNTER — Encounter: Payer: Self-pay | Admitting: Family Medicine

## 2021-07-04 ENCOUNTER — Ambulatory Visit (INDEPENDENT_AMBULATORY_CARE_PROVIDER_SITE_OTHER): Payer: No Typology Code available for payment source | Admitting: Family Medicine

## 2021-07-04 VITALS — BP 109/69 | HR 77 | Ht 68.0 in | Wt 197.0 lb

## 2021-07-04 DIAGNOSIS — H9203 Otalgia, bilateral: Secondary | ICD-10-CM

## 2021-07-04 DIAGNOSIS — E785 Hyperlipidemia, unspecified: Secondary | ICD-10-CM

## 2021-07-04 DIAGNOSIS — E1159 Type 2 diabetes mellitus with other circulatory complications: Secondary | ICD-10-CM

## 2021-07-04 DIAGNOSIS — E669 Obesity, unspecified: Secondary | ICD-10-CM

## 2021-07-04 MED ORDER — ROSUVASTATIN CALCIUM 20 MG PO TABS
20.0000 mg | ORAL_TABLET | Freq: Every day | ORAL | 3 refills | Status: DC
  Filled 2021-07-04: qty 90, 90d supply, fill #0
  Filled 2021-10-14: qty 90, 90d supply, fill #1
  Filled 2022-01-11: qty 90, 90d supply, fill #2
  Filled 2022-04-09: qty 90, 90d supply, fill #3

## 2021-07-04 NOTE — Patient Instructions (Addendum)
Annual exam in August, call if you need me sooner ? ?Crestor is increased to 20 mg daily ? ?CONGRATS on improvement in blood sugar  and overall health, keep it up! ? ?Please schedule dexa as we discussed ? ?CONDOLENCE AND CELEBRATIONS....THAT'S LIFE! ? ?Mammogram due end Septemebr, please schedule ? ?MICROALB TODAY ? ?Cbc, FASTING LIPID, CMP AND EgfR, Tsh AND VITd 5 DAYS BEFORE AUGUST APPOINTMENT ? ?It is important that you exercise regularly at least 30 minutes 5 times a week. If you develop chest pain, have severe difficulty breathing, or feel very tired, stop exercising immediately and seek medical attention  ? ?Thanks for choosing [facility], we consider it a privelige to serve you. ? ?

## 2021-07-07 ENCOUNTER — Other Ambulatory Visit (HOSPITAL_COMMUNITY): Payer: Self-pay

## 2021-07-07 LAB — MICROALBUMIN / CREATININE URINE RATIO
Creatinine, Urine: 49.4 mg/dL
Microalb/Creat Ratio: <6 mg/g{creat} (ref 0–29)
Microalbumin, Urine: <3 ug/mL

## 2021-07-11 ENCOUNTER — Encounter: Payer: Self-pay | Admitting: Family Medicine

## 2021-07-11 DIAGNOSIS — H9203 Otalgia, bilateral: Secondary | ICD-10-CM | POA: Insufficient documentation

## 2021-07-11 NOTE — Assessment & Plan Note (Signed)
Kelly Savage is reminded of the importance of commitment to daily physical activity for 30 minutes or more, as able and the need to limit carbohydrate intake to 30 to 60 grams per meal to help with blood sugar control.  ? ?The need to take medication as prescribed, test blood sugar as directed, and to call between visits if there is a concern that blood sugar is uncontrolled is also discussed.  ? ?Kelly Savage is reminded of the importance of daily foot exam, annual eye examination, and good blood sugar, blood pressure and cholesterol control. ?Excellent improvement, managed by Endo ? ?Diabetic Labs Latest Ref Rng & Units 07/04/2021 07/02/2021 03/04/2021 10/09/2020 06/03/2020  ?HbA1c 4.8 - 5.6 % - 6.6(H) 8.1(H) 8.9(H) 9.5(H)  ?Microalbumin mg/dL - - - - -  ?Micro/Creat Ratio 0 - 29 mg/g creat <6 - - - <6  ?Chol 100 - 199 mg/dL - 478 29(F) - 621  ?HDL >39 mg/dL - 50 46 - 59  ?Calc LDL 0 - 99 mg/dL - 308(M) 36 - 578(I)  ?Triglycerides 0 - 149 mg/dL - 87 80 - 696  ?Creatinine 0.57 - 1.00 mg/dL - 2.95 2.84 1.32 4.40  ? ?BP/Weight 07/04/2021 03/05/2021 10/10/2020 07/11/2020 06/05/2020 02/29/2020 10/17/2019  ?Systolic BP 109 136 120 121 118 131 128  ?Diastolic BP 69 80 78 72 69 76 84  ?Wt. (Lbs) 197 217.4 241.4 235 232 224 225  ?BMI 29.95 32.1 35.65 34.7 34.26 34.06 34.21  ? ?Foot/eye exam completion dates Latest Ref Rng & Units 09/17/2020 06/28/2019  ?Eye Exam No Retinopathy Retinopathy(A) No Retinopathy  ?Foot exam Order - - -  ?Foot Form Completion - - -  ? ? ? ? ? ?

## 2021-07-11 NOTE — Assessment & Plan Note (Signed)
?  Patient re-educated about  the importance of commitment to a  minimum of 150 minutes of exercise per week as able. ? ?The importance of healthy food choices with portion control discussed, as well as eating regularly and within a 12 hour window most days. ?The need to choose "clean , green" food 50 to 75% of the time is discussed, as well as to make water the primary drink and set a goal of 64 ounces water daily. ? ?  ?Weight /BMI 07/04/2021 03/05/2021 10/10/2020  ?WEIGHT 197 lb 217 lb 6.4 oz 241 lb 6.4 oz  ?HEIGHT 5\' 8"  5\' 9"  5\' 9"   ?BMI 29.95 kg/m2 32.1 kg/m2 35.65 kg/m2  ? ? ?Excellent weight loss, congratulated on this ?

## 2021-07-11 NOTE — Assessment & Plan Note (Signed)
Recommend decongestants as needed and daily claritin /zyrtecfor next 2 weeks, no infection ?

## 2021-07-11 NOTE — Progress Notes (Signed)
? ?Kelly Savage     MRN: [id number]      DOB: [birth year] ? ? ?HPI ?[name] is here for follow up and re-evaluation of chronic medical conditions, medication management and review of any available recent lab and radiology data.  ?Preventive health is updated, specifically  Cancer screening and Immunization.   ?Questions or concerns regarding consultations or procedures which the PT has had in the interim are  addressed. ?The PT denies any adverse reactions to current medications since the last visit.  ?There are no new concerns.  ?There are no specific complaints  ?Denies polyuria, polydipsia, blurred vision , or hypoglycemic episodes. ?Happy with blood sugar and weight loss ?Wants increase crestor dose as LDL still elevated ?C/o left ear popping ? ?ROS ?Denies recent fever or chills. ?Denies sinus pressure, nasal congestion,  or sore throat. ?Denies chest congestion, productive cough or wheezing. ?Denies chest pains, palpitations and leg swelling ?Denies abdominal pain, nausea, vomiting,diarrhea or constipation.   ?Denies dysuria, frequency, hesitancy or incontinence. ?Denies joint pain, swelling and limitation in mobility. ?Denies headaches, seizures, numbness, or tingling. ?Denies depression, anxiety or insomnia. ?Denies skin break down or rash. ? ? ?PE ? ?BP 109/69   Pulse 77   Ht 5\' 8"  (1.727 m)   Wt 197 lb (89.4 kg)   SpO2 97%   BMI 29.95 kg/m?  ? ?Patient alert and oriented and in no cardiopulmonary distress. ? ?HEENT: No facial asymmetry, EOMI,     Neck supple .TM clear bilaterally ? ?Chest: Clear to auscultation bilaterally. ? ?CVS: S1, S2 no murmurs, no S3.Regular rate. ? ?ABD: Soft non tender.  ? ?Ext: No edema ? ?MS: Adequate ROM spine, shoulders, hips and knees. ? ?Skin: Intact, no ulcerations or rash noted. ? ?Psych: Good eye contact, normal affect. Memory intact not anxious or depressed appearing. ? ?CNS: CN 2-12 intact, power,  normal throughout.no focal deficits noted. ? ? ?Assessment &  Plan ? ?Type 2 diabetes mellitus with vascular disease (HCC) ?[name] is reminded of the importance of commitment to daily physical activity for 30 minutes or more, as able and the need to limit carbohydrate intake to 30 to 60 grams per meal to help with blood sugar control.  ? ?The need to take medication as prescribed, test blood sugar as directed, and to call between visits if there is a concern that blood sugar is uncontrolled is also discussed.  ? ?[name] is reminded of the importance of daily foot exam, annual eye examination, and good blood sugar, blood pressure and cholesterol control. ?Excellent improvement, managed by Endo ? ?Diabetic Labs Latest Ref Rng & Units 07/04/2021 07/02/2021 03/04/2021 10/09/2020 06/03/2020  ?HbA1c 4.8 - 5.6 % - 6.6(H) 8.1(H) 8.9(H) 9.5(H)  ?Microalbumin mg/dL - - - - -  ?Micro/Creat Ratio 0 - 29 mg/g creat <6 - - - <6  ?Chol 100 - 199 mg/dL - 06/05/2020 562) - 13(Y  ?HDL >39 mg/dL - 50 46 - 59  ?Calc LDL 0 - 99 mg/dL - 865) 36 - 784(O)  ?Triglycerides 0 - 149 mg/dL - 87 80 - 962(X  ?Creatinine 0.57 - 1.00 mg/dL - 528 4.13 2.44 0.10  ? ?BP/Weight 07/04/2021 03/05/2021 10/10/2020 07/11/2020 06/05/2020 02/29/2020 10/17/2019  ?Systolic BP 109 136 120 121 118 131 128  ?Diastolic BP 69 80 78 72 69 76 84  ?Wt. (Lbs) 197 217.4 241.4 235 232 224 225  ?BMI 29.95 32.1 35.65 34.7 34.26 34.06 34.21  ? ?Foot/eye exam completion dates Latest Ref  Rng & Units 09/17/2020 06/28/2019  ?Eye Exam No Retinopathy Retinopathy(A) No Retinopathy  ?Foot exam Order - - -  ?Foot Form Completion - - -  ? ? ? ? ? ? ?Hyperlipidemia with target LDL less than 100 ?Hyperlipidemia:Low fat diet discussed and encouraged. ? ? ?Lipid Panel  ?Lab Results  ?Component Value Date  ? CHOL 193 07/02/2021  ? HDL 50 07/02/2021  ? LDLCALC 127 (H) 07/02/2021  ? TRIG 87 07/02/2021  ? CHOLHDL 3.9 07/02/2021  ? ? ? ?Increase dose of crestor ? ?Obesity (BMI 30.0-34.9) ? ?Patient re-educated about  the importance of commitment to a  minimum of 150 minutes  of exercise per week as able. ? ?The importance of healthy food choices with portion control discussed, as well as eating regularly and within a 12 hour window most days. ?The need to choose "clean , green" food 50 to 75% of the time is discussed, as well as to make water the primary drink and set a goal of 64 ounces water daily. ? ?  ?Weight /BMI 07/04/2021 03/05/2021 10/10/2020  ?WEIGHT 197 lb 217 lb 6.4 oz 241 lb 6.4 oz  ?HEIGHT 5\' 8"  5\' 9"  5\' 9"   ?BMI 29.95 kg/m2 32.1 kg/m2 35.65 kg/m2  ? ? ?Excellent weight loss, congratulated on this ? ?Otalgia of both ears ?Recommend decongestants as needed and daily claritin /zyrtecfor next 2 weeks, no infection ? ?

## 2021-07-11 NOTE — Assessment & Plan Note (Signed)
Hyperlipidemia:Low fat diet discussed and encouraged. ? ? ?Lipid Panel  ?Lab Results  ?Component Value Date  ? CHOL 193 07/02/2021  ? HDL 50 07/02/2021  ? LDLCALC 127 (H) 07/02/2021  ? TRIG 87 07/02/2021  ? CHOLHDL 3.9 07/02/2021  ? ? ? ?Increase dose of crestor ?

## 2021-07-24 ENCOUNTER — Other Ambulatory Visit (HOSPITAL_COMMUNITY): Payer: Self-pay

## 2021-08-05 ENCOUNTER — Other Ambulatory Visit (HOSPITAL_COMMUNITY): Payer: Self-pay

## 2021-08-06 ENCOUNTER — Other Ambulatory Visit (HOSPITAL_COMMUNITY): Payer: Self-pay

## 2021-08-08 ENCOUNTER — Other Ambulatory Visit (HOSPITAL_COMMUNITY): Payer: Self-pay

## 2021-08-11 ENCOUNTER — Other Ambulatory Visit (HOSPITAL_COMMUNITY): Payer: Self-pay

## 2021-08-12 ENCOUNTER — Other Ambulatory Visit: Payer: Self-pay | Admitting: Family Medicine

## 2021-08-12 ENCOUNTER — Other Ambulatory Visit (HOSPITAL_COMMUNITY): Payer: Self-pay

## 2021-08-12 MED ORDER — EMPAGLIFLOZIN 10 MG PO TABS
10.0000 mg | ORAL_TABLET | Freq: Every day | ORAL | 2 refills | Status: DC
  Filled 2021-08-12: qty 90, 90d supply, fill #0
  Filled 2021-10-17 – 2021-10-22 (×3): qty 90, 90d supply, fill #1
  Filled 2022-01-11: qty 90, 90d supply, fill #2

## 2021-08-21 ENCOUNTER — Other Ambulatory Visit (HOSPITAL_COMMUNITY): Payer: Self-pay

## 2021-09-03 ENCOUNTER — Other Ambulatory Visit (HOSPITAL_COMMUNITY): Payer: Self-pay

## 2021-09-23 ENCOUNTER — Encounter: Payer: Self-pay | Admitting: Emergency Medicine

## 2021-09-23 ENCOUNTER — Emergency Department
Admission: EM | Admit: 2021-09-23 | Discharge: 2021-09-23 | Disposition: A | Discharge status: Home / Self Care | Payer: No Typology Code available for payment source | Attending: Emergency Medicine | Admitting: Emergency Medicine

## 2021-09-23 ENCOUNTER — Other Ambulatory Visit (HOSPITAL_COMMUNITY): Payer: Self-pay

## 2021-09-23 DIAGNOSIS — E119 Type 2 diabetes mellitus without complications: Secondary | ICD-10-CM | POA: Insufficient documentation

## 2021-09-23 DIAGNOSIS — R109 Unspecified abdominal pain: Secondary | ICD-10-CM | POA: Diagnosis not present

## 2021-09-23 DIAGNOSIS — R17 Unspecified jaundice: Secondary | ICD-10-CM | POA: Diagnosis not present

## 2021-09-23 DIAGNOSIS — R112 Nausea with vomiting, unspecified: Secondary | ICD-10-CM | POA: Insufficient documentation

## 2021-09-23 DIAGNOSIS — R197 Diarrhea, unspecified: Secondary | ICD-10-CM | POA: Insufficient documentation

## 2021-09-23 DIAGNOSIS — D72829 Elevated white blood cell count, unspecified: Secondary | ICD-10-CM | POA: Diagnosis not present

## 2021-09-23 LAB — URINALYSIS, ROUTINE W REFLEX MICROSCOPIC
Bacteria, UA: NONE SEEN
Bilirubin Urine: NEGATIVE
Glucose, UA: >=500 mg/dL — AB
Ketones, ur: 80 mg/dL — AB
Leukocytes,Ua: NEGATIVE
Nitrite: NEGATIVE
Protein, ur: NEGATIVE mg/dL
Specific Gravity, Urine: 1.037 — ABNORMAL HIGH (ref 1.005–1.030)
pH: 5 (ref 5.0–8.0)

## 2021-09-23 LAB — COMPREHENSIVE METABOLIC PANEL
ALT: 14 U/L (ref 0–44)
AST: 18 U/L (ref 15–41)
Albumin: 3.9 g/dL (ref 3.5–5.0)
Alkaline Phosphatase: 54 U/L (ref 38–126)
Anion gap: 13 (ref 5–15)
BUN: 16 mg/dL (ref 8–23)
CO2: 24 mmol/L (ref 22–32)
Calcium: 9.4 mg/dL (ref 8.9–10.3)
Chloride: 99 mmol/L (ref 98–111)
Creatinine, Ser: 0.69 mg/dL (ref 0.44–1.00)
GFR, Estimated: >60 mL/min (ref >60)
Glucose, Bld: 178 mg/dL — ABNORMAL HIGH (ref 70–99)
Potassium: 3.5 mmol/L (ref 3.5–5.1)
Sodium: 136 mmol/L (ref 135–145)
Total Bilirubin: 2.9 mg/dL — ABNORMAL HIGH (ref 0.3–1.2)
Total Protein: 6.9 g/dL (ref 6.5–8.1)

## 2021-09-23 LAB — CBC
HCT: 42.2 % (ref 36.0–46.0)
Hemoglobin: 14.1 g/dL (ref 12.0–15.0)
MCH: 30 pg (ref 26.0–34.0)
MCHC: 33.4 g/dL (ref 30.0–36.0)
MCV: 89.8 fL (ref 80.0–100.0)
Platelets: 243 10*3/uL (ref 150–400)
RBC: 4.7 MIL/uL (ref 3.87–5.11)
RDW: 13.4 % (ref 11.5–15.5)
WBC: 11.6 10*3/uL — ABNORMAL HIGH (ref 4.0–10.5)
nRBC: 0 % (ref 0.0–0.2)

## 2021-09-23 LAB — LIPASE, BLOOD: Lipase: 23 U/L (ref 11–51)

## 2021-09-23 MED ORDER — ONDANSETRON 4 MG PO TBDP
4.0000 mg | ORAL_TABLET | Freq: Once | ORAL | Status: AC | PRN
  Administered 2021-09-23: 4 mg via ORAL
  Filled 2021-09-23: qty 1

## 2021-09-23 MED ORDER — ONDANSETRON 4 MG PO TBDP
4.0000 mg | ORAL_TABLET | Freq: Three times a day (TID) | ORAL | 1 refills | Status: DC | PRN
  Filled 2021-09-23: qty 30, 5d supply, fill #0

## 2021-09-23 NOTE — ED Provider Notes (Signed)
[facility] Provider Note    Event Date/Time   First MD Initiated Contact with Patient 09/23/21 [id number]     (approximate)   History   Abdominal Pain and Emesis   HPI  Kelly Savage is a 66 y.o. female with history of diabetes, obesity, hyperlipidemia.  She presents for evaluation of about 3 weeks of intermittent abdominal cramping and nausea and vomiting as well as occasional diarrhea.  She said that she was taking an injectable weight loss and diabetic medication (something similar to Ozempic).  She was told to stop using it if she developed abdominal pain and the other symptoms, so she stopped about 3 weeks ago but her symptoms have persisted.  Tonight it was quite severe, mostly with the nausea but also with some abdominal pain that she associates mostly with persistent vomiting.  She said that she got a Zofran 4 mg tablet when coming into the emergency department and then her symptoms went away completely.  She arrived by EMS because she felt so bad but now she feels completely normal.  She has had an appendectomy and cholecystectomy in the past.  She has a small incisional hernia that is chronic and not giving her any problems.  She denies fever, sore throat, chest pain, shortness of breath.  No dysuria nor increased urinary frequency.     Physical Exam   Triage Vital Signs: ED Triage Vitals  Enc Vitals Group     BP 09/23/21 0237 116/70     Pulse Rate 09/23/21 0237 83     Resp 09/23/21 0237 18     Temp 09/23/21 0237 97.9 F (36.6 C)     Temp Source 09/23/21 0237 Oral     SpO2 09/23/21 0237 98 %     Weight 09/23/21 0239 81.6 kg (180 lb)     Height 09/23/21 0239 1.727 m (5\' 8" )     Head Circumference --      Peak Flow --      Pain Score 09/23/21 0237 8     Pain Loc --      Pain Edu? --      Excl. in [place]? --     Most recent vital signs: Vitals:   09/23/21 0237 09/23/21 0600  BP: 116/70 122/68  Pulse: 83 80  Resp: 18 18  Temp: 97.9 F (36.6 C)    SpO2: 98% 98%     General: Awake, no distress.  Well-appearing. CV:  Good peripheral perfusion.  Resp:  Normal effort.  Lungs are clear to auscultation. Abd:  No distention.  No tenderness to palpation anywhere in the abdomen including the epigastrium, right upper quadrant, nor lower abdomen.  No rebound and no guarding. Other:  Ambulatory without difficulty, no focal neurological deficits, no imbalance nor gait disturbances.   ED Results / Procedures / Treatments   Labs (all labs ordered are listed, but only abnormal results are displayed) Labs Reviewed  COMPREHENSIVE METABOLIC PANEL - Abnormal; Notable for the following components:      Result Value   Glucose, Bld 178 (*)    Total Bilirubin 2.9 (*)    All other components within normal limits  CBC - Abnormal; Notable for the following components:   WBC 11.6 (*)    All other components within normal limits  LIPASE, BLOOD  URINALYSIS, ROUTINE W REFLEX MICROSCOPIC     PROCEDURES:  Critical Care performed: No  Procedures   MEDICATIONS ORDERED IN ED: Medications  ondansetron (ZOFRAN-ODT) disintegrating  tablet 4 mg (4 mg Oral Given 09/23/21 0241)     IMPRESSION / MDM / ASSESSMENT AND PLAN / ED COURSE  I reviewed the triage vital signs and the nursing notes.                              Differential diagnosis includes, but is not limited to, medication or drug side effect, viral illness, metabolic or electrolyte abnormality, SBO/ileus, acute infection.  Vital signs are stable and within normal limits.  Labs ordered include CMP, lipase, CBC, and urinalysis.  Lab results are generally reassuring with a very slight leukocytosis of 11.6 which is not clinically concerning.  Everything else is within normal limits with the exception of a total bilirubin of 2.9.  I mention this to her she said that she has been told in the past that her bilirubin is elevated.  Most importantly she has no tenderness to palpation of the  abdomen at all at this time, and she is status postcholecystectomy.  There is no reason to believe that this hyperbilirubinemia represents an acute biliary obstruction under the circumstances.  It is most likely elevated because of her nausea and vomiting.  I talked with her about the plan and she is very eager to go home given that her symptoms have resolved.  She requested a prescription for Zofran which I provided.  I gave my usual customary follow-up recommendations and return precautions.  Patient's presentation is most consistent with acute illness / injury with system symptoms.         FINAL CLINICAL IMPRESSION(S) / ED DIAGNOSES   Final diagnoses:  Nausea and vomiting, unspecified vomiting type  Elevated bilirubin     Rx / DC Orders   ED Discharge Orders          Ordered    ondansetron (ZOFRAN-ODT) 4 MG disintegrating tablet        09/23/21 0550             Note:  This document was prepared using Dragon voice recognition software and may include unintentional dictation errors.   Hinda Kehr, MD 09/23/21 [phone]

## 2021-09-23 NOTE — Discharge Instructions (Signed)
Other than an elevation of your total bilirubin, the rest of your labs are reassuring tonight.  Your symptoms resolved after a dose of Zofran.  Most likely you are still experiencing aftereffects of the medication you were taking.  In the meantime, continue taking your other medications, and follow-up at the next available opportunity with your regular doctor.  Return to the emergency department if you develop new or worsening symptoms that concern you.

## 2021-09-23 NOTE — ED Triage Notes (Signed)
Pt presents via EMS with complaints of lower abdominal pain, N/V/D for the last 2 weeks. Pt did not receive med PTA nor has she been taking medications to aid with the pain or nausea at home. Denies fevers or chills.  CBG 194.

## 2021-09-25 ENCOUNTER — Other Ambulatory Visit: Payer: Self-pay

## 2021-09-25 ENCOUNTER — Ambulatory Visit (INDEPENDENT_AMBULATORY_CARE_PROVIDER_SITE_OTHER): Payer: No Typology Code available for payment source | Admitting: Family Medicine

## 2021-09-25 ENCOUNTER — Emergency Department (HOSPITAL_COMMUNITY): Payer: No Typology Code available for payment source

## 2021-09-25 ENCOUNTER — Inpatient Hospital Stay (HOSPITAL_COMMUNITY)
Admission: EM | Admit: 2021-09-25 | Discharge: 2021-10-01 | DRG: 346 | Disposition: A | Discharge status: Home / Self Care | Payer: No Typology Code available for payment source | Attending: General Surgery | Admitting: General Surgery

## 2021-09-25 ENCOUNTER — Encounter (HOSPITAL_COMMUNITY): Payer: Self-pay | Admitting: Emergency Medicine

## 2021-09-25 ENCOUNTER — Encounter: Payer: Self-pay | Admitting: Family Medicine

## 2021-09-25 VITALS — BP 132/73 | HR 96 | Resp 16 | Ht 68.0 in | Wt 176.1 lb

## 2021-09-25 DIAGNOSIS — R1110 Vomiting, unspecified: Secondary | ICD-10-CM | POA: Insufficient documentation

## 2021-09-25 DIAGNOSIS — K436 Other and unspecified ventral hernia with obstruction, without gangrene: Secondary | ICD-10-CM | POA: Diagnosis not present

## 2021-09-25 DIAGNOSIS — R112 Nausea with vomiting, unspecified: Secondary | ICD-10-CM

## 2021-09-25 DIAGNOSIS — I10 Essential (primary) hypertension: Secondary | ICD-10-CM

## 2021-09-25 DIAGNOSIS — K420 Umbilical hernia with obstruction, without gangrene: Secondary | ICD-10-CM

## 2021-09-25 DIAGNOSIS — Z7984 Long term (current) use of oral hypoglycemic drugs: Secondary | ICD-10-CM

## 2021-09-25 DIAGNOSIS — E876 Hypokalemia: Secondary | ICD-10-CM | POA: Diagnosis not present

## 2021-09-25 DIAGNOSIS — E1159 Type 2 diabetes mellitus with other circulatory complications: Secondary | ICD-10-CM | POA: Diagnosis present

## 2021-09-25 DIAGNOSIS — Z79899 Other long term (current) drug therapy: Secondary | ICD-10-CM

## 2021-09-25 DIAGNOSIS — Z833 Family history of diabetes mellitus: Secondary | ICD-10-CM

## 2021-09-25 DIAGNOSIS — E1169 Type 2 diabetes mellitus with other specified complication: Secondary | ICD-10-CM | POA: Diagnosis present

## 2021-09-25 DIAGNOSIS — R1112 Projectile vomiting: Secondary | ICD-10-CM

## 2021-09-25 DIAGNOSIS — Z7982 Long term (current) use of aspirin: Secondary | ICD-10-CM

## 2021-09-25 DIAGNOSIS — Z800 Family history of malignant neoplasm of digestive organs: Secondary | ICD-10-CM

## 2021-09-25 DIAGNOSIS — Z96652 Presence of left artificial knee joint: Secondary | ICD-10-CM | POA: Diagnosis present

## 2021-09-25 DIAGNOSIS — R109 Unspecified abdominal pain: Secondary | ICD-10-CM | POA: Diagnosis not present

## 2021-09-25 DIAGNOSIS — Z9049 Acquired absence of other specified parts of digestive tract: Secondary | ICD-10-CM

## 2021-09-25 DIAGNOSIS — R1909 Other intra-abdominal and pelvic swelling, mass and lump: Secondary | ICD-10-CM | POA: Insufficient documentation

## 2021-09-25 DIAGNOSIS — K56609 Unspecified intestinal obstruction, unspecified as to partial versus complete obstruction: Secondary | ICD-10-CM

## 2021-09-25 DIAGNOSIS — K5730 Diverticulosis of large intestine without perforation or abscess without bleeding: Secondary | ICD-10-CM

## 2021-09-25 DIAGNOSIS — K469 Unspecified abdominal hernia without obstruction or gangrene: Secondary | ICD-10-CM | POA: Insufficient documentation

## 2021-09-25 DIAGNOSIS — Z8249 Family history of ischemic heart disease and other diseases of the circulatory system: Secondary | ICD-10-CM

## 2021-09-25 DIAGNOSIS — K439 Ventral hernia without obstruction or gangrene: Secondary | ICD-10-CM

## 2021-09-25 DIAGNOSIS — K430 Incisional hernia with obstruction, without gangrene: Secondary | ICD-10-CM

## 2021-09-25 DIAGNOSIS — E785 Hyperlipidemia, unspecified: Secondary | ICD-10-CM | POA: Diagnosis present

## 2021-09-25 LAB — CBC
HCT: 45.1 % (ref 36.0–46.0)
Hemoglobin: 15.1 g/dL — ABNORMAL HIGH (ref 12.0–15.0)
MCH: 30.6 pg (ref 26.0–34.0)
MCHC: 33.5 g/dL (ref 30.0–36.0)
MCV: 91.3 fL (ref 80.0–100.0)
Platelets: 303 10*3/uL (ref 150–400)
RBC: 4.94 MIL/uL (ref 3.87–5.11)
RDW: 13.2 % (ref 11.5–15.5)
WBC: 7.7 10*3/uL (ref 4.0–10.5)
nRBC: 0 % (ref 0.0–0.2)

## 2021-09-25 LAB — COMPREHENSIVE METABOLIC PANEL
ALT: 17 U/L (ref 0–44)
AST: 17 U/L (ref 15–41)
Albumin: 4.2 g/dL (ref 3.5–5.0)
Alkaline Phosphatase: 58 U/L (ref 38–126)
Anion gap: 17 — ABNORMAL HIGH (ref 5–15)
BUN: 21 mg/dL (ref 8–23)
CO2: 20 mmol/L — ABNORMAL LOW (ref 22–32)
Calcium: 9.1 mg/dL (ref 8.9–10.3)
Chloride: 95 mmol/L — ABNORMAL LOW (ref 98–111)
Creatinine, Ser: 0.76 mg/dL (ref 0.44–1.00)
GFR, Estimated: >60 mL/min (ref >60)
Glucose, Bld: 136 mg/dL — ABNORMAL HIGH (ref 70–99)
Potassium: 4.3 mmol/L (ref 3.5–5.1)
Sodium: 132 mmol/L — ABNORMAL LOW (ref 135–145)
Total Bilirubin: 2.5 mg/dL — ABNORMAL HIGH (ref 0.3–1.2)
Total Protein: 7.3 g/dL (ref 6.5–8.1)

## 2021-09-25 LAB — LIPASE, BLOOD: Lipase: 23 U/L (ref 11–51)

## 2021-09-25 LAB — GLUCOSE, POCT (MANUAL RESULT ENTRY): POC Glucose: 132 mg/dl — AB (ref 70–99)

## 2021-09-25 MED ORDER — IOHEXOL 300 MG/ML  SOLN
100.0000 mL | Freq: Once | INTRAMUSCULAR | Status: AC | PRN
  Administered 2021-09-25: 100 mL via INTRAVENOUS

## 2021-09-25 MED ORDER — ONDANSETRON HCL 4 MG/2ML IJ SOLN
4.0000 mg | Freq: Once | INTRAMUSCULAR | Status: AC
  Administered 2021-09-25: 4 mg via INTRAVENOUS
  Filled 2021-09-25: qty 2

## 2021-09-25 MED ORDER — ONDANSETRON HCL 4 MG/2ML IJ SOLN
4.0000 mg | Freq: Once | INTRAMUSCULAR | Status: AC
  Administered 2021-09-25: 4 mg via INTRAMUSCULAR

## 2021-09-25 NOTE — Assessment & Plan Note (Addendum)
Excessive vomiting x 4 days, needs scan, PERIUMBILICAL MASS, ATGTEMPT TO GET OUT PT STAT UNSUCCESSFUL, WILL SEND TO ED, MAY HAVE SURGICAL ABDOMEN

## 2021-09-25 NOTE — Progress Notes (Signed)
   Kelly Savage     MRN: [id number]      DOB: October 22, 1955   HPI Kelly Savage is here  with a 3 week h/o intermittent nausea and vomiting, worse since Monday, went to the eD , labs fair, advised to see  pCP if persists. Had explosive diarrheah this past Saturday, since then no bM. She has not eaten since Monday, afraid to eat, lower abdominal discomfort, loud BS at times , reduced flatus, feels weak Stopped diabetic  medication mounjaro 3 weeks ago and also has taken no diabetic meds this week.  ROS Denies recent fever HAS HAD chills. Denies sinus pressure, nasal congestion, ear pain or sore throat. Denies chest congestion, productive cough or wheezing. Denies chest pains, palpitations and leg swelling Denies joint pain, swelling and limitation in mobility. Denies headaches, seizures, numbness, or tingling. Denies skin break down or rash.   PE  BP 132/73   Pulse 96   Resp 16   Ht 5\' 8"  (1.727 m)   Wt 176 lb 1.9 oz (79.9 kg)   SpO2 96%   BMI 26.78 kg/m   Patient alert and oriented and in no cardiopulmonary distress.ILL APPEARING  HEENT: No facial asymmetry, EOMI,     Neck supple .  Chest: Clear to auscultation bilaterally.  CVS: S1, S2 no murmurs, no S3.Regular rate.  ABD: DISTENDED, PERIUMBILICAL TENDERNESS AND MASS, HYPERACTIVE BS  Ext: No edema  MS: Adequate ROM spine, shoulders, hips and knees.  Skin: Intact, no ulcerations or rash noted.  Psych: Good eye contact, normal affect. Memory intact not anxious or depressed appearing.  CNS: CN 2-12 intact, power,  normal throughout.no focal deficits noted.   Assessment & Plan  Vomiting 3 week h/o n/v worse this past 4 days, central abd mass, hyperactive BS, urgent scan if unable to get directly she will need to go through the ED  Central abdominal mass Excessive vomiting x 4 days, needs scan, PERIUMBILICAL MASS, ATGTEMPT TO GET OUT PT STAT UNSUCCESSFUL, WILL SEND TO ED, MAY HAVE SURGICAL ABDOMEN  Type 2 diabetes mellitus  with vascular disease (HCC) Controlled, blood glucose in the office is 130   Projectile vomiting with nausea 5 day h/o worsening symptoms, zofran 4 mg Im, needs urgent ct abd and pelvis, suspect obstruction, unable to get sat,sent to ED and I spoke with MD  Essential hypertension Controlled, no change in medication

## 2021-09-25 NOTE — Assessment & Plan Note (Signed)
Controlled, blood glucose in the office is 130

## 2021-09-25 NOTE — Assessment & Plan Note (Signed)
3 week h/o n/v worse this past 4 days, central abd mass, hyperactive BS, urgent scan if unable to get directly she will need to go through the ED

## 2021-09-25 NOTE — ED Notes (Signed)
Pt provided with ice chips, explained NPO at midnight.

## 2021-09-25 NOTE — ED Provider Notes (Signed)
[facility] EMERGENCY DEPARTMENT Provider Note   CSN: [id number] Arrival date & time: 09/25/21  1434     History  Chief Complaint  Patient presents with   Abdominal Pain    Kelly Savage is a 66 y.o. female.  Patient presents to the hospital at the recommendation of her primary care provider for CT scan to evaluate for possible small bowel obstruction.  Patient has had 3 weeks of intermittent nausea and vomiting, worse since Monday.  Saturday she had explosive diarrhea and has had no bowel movement since.  She has not eaten since Monday due to lower abdominal discomfort, loud bowel sounds, reduced flatus.  She also feels weak.  3 weeks ago she stopped the medication Mounjaro for diabetes and has taken no diabetic medications this week.  She denies shortness of breath, chest pain.  Endorses abdominal pain, nausea, vomiting.  Past medical history significant for type 2 diabetes with vascular disease, hyperlipidemia, abdominal hernia, central abdominal mass.  History of cholecystectomy, appendectomy  HPI     Home Medications Prior to Admission medications   Medication Sig Start Date End Date Taking? Authorizing Provider  aspirin EC 81 MG tablet Take 81 mg by mouth daily.    [provider]  benazepril (LOTENSIN) 5 MG tablet Take 1 tablet (5 mg total) by mouth daily. 03/25/21   Fayrene Helper, MD  blood glucose meter kit and supplies Dispense based on patient and insurance preference. Three times daily testing dx e11.65 01/31/19   Fayrene Helper, MD  calcium carbonate (OSCAL) 1500 (600 Ca) MG TABS tablet Take 1,200 mg by mouth every evening.    [provider]  COVID-19 mRNA Vac-TriS, Pfizer, (PFIZER-BIONT COVID-19 VAC-TRIS) SUSP injection Inject into the muscle. 11/14/20   Carlyle Basques, MD  diclofenac (VOLTAREN) 75 MG EC tablet Take 1 tablet (75 mg total) by mouth daily as needed for knee pain 01/10/21   Fayrene Helper, MD  empagliflozin (JARDIANCE) 10 MG  TABS tablet Take 1 tablet (10 mg total) by mouth daily before breakfast. 08/12/21   Fayrene Helper, MD  glipiZIDE (GLUCOTROL XL) 10 MG 24 hr tablet Take 2 tablets (20 mg total) by mouth in the morning at breakfast 03/25/21   Fayrene Helper, MD  glipiZIDE (GLUCOTROL) 5 MG tablet Take 1 tablet (5 mg total) by mouth daily 30 minutes before breakfast 04/29/21     glucose blood (FREESTYLE LITE) test strip Use as instructed to test blood sugar once daily. 10/10/20   Fayrene Helper, MD  Lancets (FREESTYLE) lancets Use as instructed to test blood sugar once daily. 10/10/20   Fayrene Helper, MD  metFORMIN (GLUCOPHAGE) 500 MG tablet Take 2 tablets (1,000 mg total) by mouth 2 (two) times daily for blood sugar. 05/21/21   Fayrene Helper, MD  ondansetron (ZOFRAN-ODT) 4 MG disintegrating tablet Allow1-2 tablets (4-8 mg total) to dissolve in the mouth every 8 (eight) hours as needed for nausea/vomiting 09/23/21   Hinda Kehr, MD  rosuvastatin (CRESTOR) 20 MG tablet Take 1 tablet (20 mg total) by mouth daily. 07/04/21   Fayrene Helper, MD  tirzepatide [facility]) 10 MG/0.5ML Pen Inject 10 mg into the skin once a week. Patient not taking: Reported on 09/25/2021 04/17/21     tirzepatide (MOUNJARO) 7.5 MG/0.5ML Pen Inject 7.5 mg into the skin once a week. Patient not taking: Reported on 09/25/2021 06/13/21     SEMGLEE 100 UNIT/ML injection Inject 0.35 mLs (35 Units total) into the skin  daily. 08/22/20 08/23/20  Fayrene Helper, MD      Allergies    Patient has no known allergies.    Review of Systems   Review of Systems  Constitutional:  Negative for fever.  Respiratory:  Negative for shortness of breath.   Cardiovascular:  Negative for chest pain.  Gastrointestinal:  Positive for abdominal pain, nausea and vomiting.       No bowel movement since Saturday   Physical Exam Updated Vital Signs BP 126/64   Pulse 85   Temp 97.8 F (36.6 C) (Oral)   Resp 19   Ht _0  (1.727 m)   Wt  79.9 kg   SpO2 98%   BMI 26.78 kg/m  Physical Exam Vitals and nursing note reviewed.  Constitutional:      General: She is not in acute distress. HENT:     Head: Normocephalic and atraumatic.     Mouth/Throat:     Mouth: Mucous membranes are moist.  Eyes:     Extraocular Movements: Extraocular movements intact.  Cardiovascular:     Rate and Rhythm: Normal rate and regular rhythm.     Heart sounds: Normal heart sounds.  Pulmonary:     Effort: Pulmonary effort is normal.     Breath sounds: Normal breath sounds.  Abdominal:     General: Abdomen is flat. Bowel sounds are normal. There is no distension.     Palpations: Abdomen is soft.     Tenderness: There is abdominal tenderness in the periumbilical area.     Hernia: A hernia is present. Hernia is present in the umbilical area.  Skin:    General: Skin is warm.     Capillary Refill: Capillary refill takes less than 2 seconds.  Neurological:     Mental Status: She is alert.    ED Results / Procedures / Treatments   Labs (all labs ordered are listed, but only abnormal results are displayed) Labs Reviewed  COMPREHENSIVE METABOLIC PANEL - Abnormal; Notable for the following components:      Result Value   Sodium 132 (*)    Chloride 95 (*)    CO2 20 (*)    Glucose, Bld 136 (*)    Total Bilirubin 2.5 (*)    Anion gap 17 (*)    All other components within normal limits  CBC - Abnormal; Notable for the following components:   Hemoglobin 15.1 (*)    All other components within normal limits  LIPASE, BLOOD  URINALYSIS, ROUTINE W REFLEX MICROSCOPIC    EKG None  Radiology CT ABDOMEN PELVIS W CONTRAST  Result Date: 09/25/2021 CLINICAL DATA:  Bowel obstruction suspected.  Nausea and vomiting. EXAM: CT ABDOMEN AND PELVIS WITH CONTRAST TECHNIQUE: Multidetector CT imaging of the abdomen and pelvis was performed using the standard protocol following bolus administration of intravenous contrast. RADIATION DOSE REDUCTION: This exam  was performed according to the departmental dose-optimization program which includes automated exposure control, adjustment of the mA and/or kV according to patient size and/or use of iterative reconstruction technique. CONTRAST:  140m OMNIPAQUE IOHEXOL 300 MG/ML  SOLN COMPARISON:  CT abdomen and pelvis 10/05/2007. FINDINGS: Lower chest: No acute abnormality. Hepatobiliary: Gallbladder surgically absent. No biliary ductal dilatation. There is a hypodensity in the right lobe of the liver which is too small to characterize measuring 8 mm image 2/31. This was not seen in 2009. Pancreas: Unremarkable. No pancreatic ductal dilatation or surrounding inflammatory changes. Spleen: Normal in size without focal abnormality. Adrenals/Urinary Tract: Adrenal glands  are unremarkable. Kidneys are normal, without renal calculi, focal lesion, or hydronephrosis. Bladder is unremarkable. Stomach/Bowel: There is an umbilical hernia which is moderate in size containing a loop of small bowel with marked wall thickening and surrounding mesenteric edema/inflammation. This is transition point for small bowel obstruction. Small bowel loops proximal to this level are dilated with air-fluid levels measuring up to 4 cm. Stomach is nondilated. Colon is nondilated. Appendix not visualized. There is sigmoid colon diverticulosis without evidence for acute diverticulitis. There is some mild wall thickening of short segment small bowel in the right lower quadrant, indeterminate. No free air. Vascular/Lymphatic: No significant vascular findings are present. No enlarged abdominal or pelvic lymph nodes. Reproductive: Uterus and bilateral adnexa are unremarkable. Other: There is a moderate-sized fat containing hernia, right of midline near the umbilicus containing a small amount of free fluid. There is a fat containing umbilical hernia as described above. No free air. Musculoskeletal: Severe degenerative changes affect the spine. IMPRESSION: 1.  Umbilical hernia containing a loop of small bowel with marked wall thickening and surrounding inflammation. There is resultant small bowel obstruction. Findings are worrisome for incarcerated/strangulated hernia. Surgical consultation recommended. 2. Additional ventral hernia containing fluid and fat, just right of the umbilicus. 3. Sigmoid colon diverticulosis. 4. Hypodense lesion in the liver is too small to characterize in new from 2009. This can be further evaluated with ultrasound or MRI as clinically appropriate. Electronically Signed   By: Ronney Asters M.D.   On: 09/25/2021 22:47    Procedures Procedures    Medications Ordered in ED Medications  ondansetron (ZOFRAN) injection 4 mg (has no administration in time range)  iohexol (OMNIPAQUE) 300 MG/ML solution 100 mL (100 mLs Intravenous Contrast Given 09/25/21 2212)    ED Course/ Medical Decision Making/ A&P                           Medical Decision Making Amount and/or Complexity of Data Reviewed Labs: ordered. Radiology: ordered.  Risk Prescription drug management.   This patient presents to the ED for concern of possible small bowel obstruction   Co morbidities that complicate the patient evaluation  History of appendectomy, cholecystectomy   Additional history obtained:   External records from outside source obtained and reviewed including primary care notes from earlier today   Lab Tests:  I Ordered, and personally interpreted labs.  The pertinent results include: Sodium 132, hemoglobin 15.1, lipase 23   Imaging Studies ordered:  I ordered imaging studies including CT abdomen pelvis I independently visualized and interpreted imaging which showed umbilical hernia containing a loop of small bowel with marked wall thickening and surrounding inflammation.  There is resultant small bowel obstruction.  Findings worrisome for incarcerated/strangulated hernia.  Ventral hernia containing fluid and fat just right of  umbilicus.  Sigmoid colon diverticulosis.  Hypodense lesion in the liver too small to characterize new from 2009. I agree with the radiologist interpretation   Cardiac Monitoring: / EKG:  The patient was maintained on a cardiac monitor.  I personally viewed and interpreted the cardiac monitored which showed an underlying rhythm of: Normal sinus rhythm   Consultations Obtained:  I requested consultation with the general surgeon, Dr. Arnoldo Morale, and discussed lab and imaging findings as well as pertinent plan - they recommend: Admission to the hospital, n.p.o. at midnight.  Plan to see in morning  I requested consultation with the hospitalist.   Problem List / ED Course / Critical interventions /  Medication management   I ordered medication including Zofran for nausea Reevaluation of the patient after these medicines showed that the patient improved I have reviewed the patients home medicines and have made adjustments as needed   Test / Admission - Considered:  The patient has an umbilical hernia containing small loop of bowel with resultant small bowel obstruction.  Finding also worrisome for incarcerated/angulated hernia.  I discussed with surgery who recommended admission for possible surgical intervention tomorrow.  Admit to hospital.  Final Clinical Impression(s) / ED Diagnoses Final diagnoses:  Small bowel obstruction ([place])  Ventral hernia without obstruction or gangrene  Sigmoid diverticulosis    Rx / DC Orders ED Discharge Orders     None         Ronny Bacon 09/25/21 2349    Fredia Sorrow, MD 10/10/21 1535

## 2021-09-25 NOTE — Patient Instructions (Addendum)
F/u IN   2 WEEKS, CALL IF YOU NEED ME SOONER  pLEASE GO DIRECTLY TO THE Ed  HERE, I HAVE SPOKEN WITH THE dOCTOR

## 2021-09-25 NOTE — ED Triage Notes (Addendum)
Pt sent from Dr. Moshe Cipro office to r/o SBO,  N/V x 3 weeks.

## 2021-09-26 ENCOUNTER — Inpatient Hospital Stay (HOSPITAL_COMMUNITY): Payer: No Typology Code available for payment source

## 2021-09-26 ENCOUNTER — Encounter (HOSPITAL_COMMUNITY)
Admission: EM | Disposition: A | Discharge status: Home / Self Care | Payer: Self-pay | Source: Home / Self Care | Attending: General Surgery

## 2021-09-26 ENCOUNTER — Other Ambulatory Visit: Payer: Self-pay

## 2021-09-26 ENCOUNTER — Inpatient Hospital Stay (HOSPITAL_COMMUNITY): Payer: No Typology Code available for payment source | Admitting: Anesthesiology

## 2021-09-26 DIAGNOSIS — K430 Incisional hernia with obstruction, without gangrene: Secondary | ICD-10-CM | POA: Diagnosis not present

## 2021-09-26 DIAGNOSIS — K551 Chronic vascular disorders of intestine: Secondary | ICD-10-CM | POA: Diagnosis not present

## 2021-09-26 DIAGNOSIS — K5710 Diverticulosis of small intestine without perforation or abscess without bleeding: Secondary | ICD-10-CM

## 2021-09-26 DIAGNOSIS — I10 Essential (primary) hypertension: Secondary | ICD-10-CM

## 2021-09-26 DIAGNOSIS — R109 Unspecified abdominal pain: Secondary | ICD-10-CM | POA: Diagnosis present

## 2021-09-26 DIAGNOSIS — Z9049 Acquired absence of other specified parts of digestive tract: Secondary | ICD-10-CM | POA: Diagnosis not present

## 2021-09-26 DIAGNOSIS — Z96652 Presence of left artificial knee joint: Secondary | ICD-10-CM | POA: Diagnosis present

## 2021-09-26 DIAGNOSIS — K56609 Unspecified intestinal obstruction, unspecified as to partial versus complete obstruction: Secondary | ICD-10-CM | POA: Diagnosis present

## 2021-09-26 DIAGNOSIS — Z800 Family history of malignant neoplasm of digestive organs: Secondary | ICD-10-CM | POA: Diagnosis not present

## 2021-09-26 DIAGNOSIS — Z8249 Family history of ischemic heart disease and other diseases of the circulatory system: Secondary | ICD-10-CM | POA: Diagnosis not present

## 2021-09-26 DIAGNOSIS — Z7984 Long term (current) use of oral hypoglycemic drugs: Secondary | ICD-10-CM | POA: Diagnosis not present

## 2021-09-26 DIAGNOSIS — Z79899 Other long term (current) drug therapy: Secondary | ICD-10-CM | POA: Diagnosis not present

## 2021-09-26 DIAGNOSIS — K439 Ventral hernia without obstruction or gangrene: Secondary | ICD-10-CM

## 2021-09-26 DIAGNOSIS — Z7982 Long term (current) use of aspirin: Secondary | ICD-10-CM | POA: Diagnosis not present

## 2021-09-26 DIAGNOSIS — E1169 Type 2 diabetes mellitus with other specified complication: Secondary | ICD-10-CM | POA: Diagnosis present

## 2021-09-26 DIAGNOSIS — K5730 Diverticulosis of large intestine without perforation or abscess without bleeding: Secondary | ICD-10-CM | POA: Diagnosis present

## 2021-09-26 DIAGNOSIS — Z833 Family history of diabetes mellitus: Secondary | ICD-10-CM | POA: Diagnosis not present

## 2021-09-26 DIAGNOSIS — K436 Other and unspecified ventral hernia with obstruction, without gangrene: Secondary | ICD-10-CM | POA: Diagnosis present

## 2021-09-26 DIAGNOSIS — E785 Hyperlipidemia, unspecified: Secondary | ICD-10-CM | POA: Diagnosis present

## 2021-09-26 HISTORY — PX: BOWEL RESECTION: SHX1257

## 2021-09-26 HISTORY — PX: INCISIONAL HERNIA REPAIR: SHX193

## 2021-09-26 LAB — LACTIC ACID, PLASMA
Lactic Acid, Venous: 1.3 mmol/L (ref 0.5–1.9)
Lactic Acid, Venous: 0.7 mmol/L (ref 0.5–1.9)

## 2021-09-26 LAB — URINALYSIS, ROUTINE W REFLEX MICROSCOPIC
Bacteria, UA: NONE SEEN
Bilirubin Urine: NEGATIVE
Glucose, UA: >=500 mg/dL — AB
Ketones, ur: 80 mg/dL — AB
Nitrite: NEGATIVE
Protein, ur: NEGATIVE mg/dL
Specific Gravity, Urine: >1.046 — ABNORMAL HIGH (ref 1.005–1.030)
pH: 5 (ref 5.0–8.0)

## 2021-09-26 LAB — MAGNESIUM: Magnesium: 1.9 mg/dL (ref 1.7–2.4)

## 2021-09-26 LAB — GLUCOSE, CAPILLARY
Glucose-Capillary: 106 mg/dL — ABNORMAL HIGH (ref 70–99)
Glucose-Capillary: 79 mg/dL (ref 70–99)
Glucose-Capillary: 113 mg/dL — ABNORMAL HIGH (ref 70–99)
Glucose-Capillary: 79 mg/dL (ref 70–99)
Glucose-Capillary: 133 mg/dL — ABNORMAL HIGH (ref 70–99)
Glucose-Capillary: 165 mg/dL — ABNORMAL HIGH (ref 70–99)

## 2021-09-26 LAB — SURGICAL PCR SCREEN
MRSA, PCR: NEGATIVE
Staphylococcus aureus: NEGATIVE

## 2021-09-26 LAB — HIV ANTIBODY (ROUTINE TESTING W REFLEX): HIV Screen 4th Generation wRfx: NONREACTIVE

## 2021-09-26 SURGERY — REPAIR, HERNIA, INCISIONAL
Anesthesia: General | Site: Abdomen

## 2021-09-26 MED ORDER — ROSUVASTATIN CALCIUM 20 MG PO TABS: 20.0000 mg | ORAL_TABLET | Freq: Every day | ORAL | Status: DC

## 2021-09-26 MED ORDER — INSULIN ASPART 100 UNIT/ML IJ SOLN: 0.0000 [IU] | Freq: Every day | INTRAMUSCULAR | Status: DC

## 2021-09-26 MED ORDER — MORPHINE SULFATE (PF) 2 MG/ML IV SOLN: 2.0000 mg | INTRAVENOUS | Status: DC | PRN

## 2021-09-26 MED ORDER — FENTANYL CITRATE (PF) 100 MCG/2ML IJ SOLN
INTRAMUSCULAR | Status: AC
  Filled 2021-09-26: qty 2

## 2021-09-26 MED ORDER — INSULIN ASPART 100 UNIT/ML IJ SOLN: 0.0000 [IU] | Freq: Three times a day (TID) | INTRAMUSCULAR | Status: DC

## 2021-09-26 MED ORDER — DEXTROSE 50 % IV SOLN
INTRAVENOUS | Status: AC
  Administered 2021-09-26: 25 mL
  Filled 2021-09-26: qty 50

## 2021-09-26 MED ORDER — ACETAMINOPHEN 325 MG PO TABS: 650.0000 mg | ORAL_TABLET | Freq: Four times a day (QID) | ORAL | Status: DC | PRN

## 2021-09-26 MED ORDER — CEFAZOLIN SODIUM-DEXTROSE 2-4 GM/100ML-% IV SOLN
2.0000 g | INTRAVENOUS | Status: AC
  Administered 2021-09-26: 2 g via INTRAVENOUS
  Filled 2021-09-26: qty 100

## 2021-09-26 MED ORDER — LORAZEPAM 2 MG/ML IJ SOLN
1.0000 mg | INTRAMUSCULAR | Status: DC | PRN
Start: 2021-09-26 — End: 2021-10-01

## 2021-09-26 MED ORDER — ROCURONIUM BROMIDE 10 MG/ML (PF) SYRINGE
PREFILLED_SYRINGE | INTRAVENOUS | Status: AC
  Filled 2021-09-26: qty 10

## 2021-09-26 MED ORDER — FENTANYL CITRATE (PF) 100 MCG/2ML IJ SOLN
INTRAMUSCULAR | Status: DC | PRN
  Administered 2021-09-26 (×2): 50 ug via INTRAVENOUS
  Administered 2021-09-26: 100 ug via INTRAVENOUS
  Administered 2021-09-26 (×3): 50 ug via INTRAVENOUS

## 2021-09-26 MED ORDER — POVIDONE-IODINE 10 % EX OINT
TOPICAL_OINTMENT | CUTANEOUS | Status: AC
  Filled 2021-09-26: qty 1

## 2021-09-26 MED ORDER — INSULIN ASPART 100 UNIT/ML IJ SOLN
0.0000 [IU] | Freq: Three times a day (TID) | INTRAMUSCULAR | Status: DC
  Administered 2021-09-27 – 2021-09-29 (×7): 3 [IU] via SUBCUTANEOUS
  Administered 2021-09-29: 2 [IU] via SUBCUTANEOUS
  Administered 2021-09-29 – 2021-09-30 (×2): 3 [IU] via SUBCUTANEOUS
  Administered 2021-09-30: 2 [IU] via SUBCUTANEOUS
  Administered 2021-09-30 – 2021-10-01 (×2): 3 [IU] via SUBCUTANEOUS

## 2021-09-26 MED ORDER — LACTATED RINGERS IV SOLN: INTRAVENOUS | Status: DC

## 2021-09-26 MED ORDER — PROCHLORPERAZINE EDISYLATE 10 MG/2ML IJ SOLN
10.0000 mg | Freq: Four times a day (QID) | INTRAMUSCULAR | Status: DC | PRN
Start: 2021-09-26 — End: 2021-09-26

## 2021-09-26 MED ORDER — ONDANSETRON 4 MG PO TBDP: 4.0000 mg | ORAL_TABLET | Freq: Four times a day (QID) | ORAL | Status: DC | PRN

## 2021-09-26 MED ORDER — KETOROLAC TROMETHAMINE 15 MG/ML IJ SOLN
15.0000 mg | Freq: Four times a day (QID) | INTRAMUSCULAR | Status: DC | PRN
  Administered 2021-09-26 – 2021-09-27 (×2): 15 mg via INTRAVENOUS
  Filled 2021-09-26 (×4): qty 1

## 2021-09-26 MED ORDER — SUGAMMADEX SODIUM 500 MG/5ML IV SOLN
INTRAVENOUS | Status: DC | PRN
  Administered 2021-09-26: 319.6 mg via INTRAVENOUS

## 2021-09-26 MED ORDER — CHLORHEXIDINE GLUCONATE CLOTH 2 % EX PADS
6.0000 | MEDICATED_PAD | Freq: Once | CUTANEOUS | Status: AC
  Administered 2021-09-26: 6 via TOPICAL

## 2021-09-26 MED ORDER — MEPERIDINE HCL 50 MG/ML IJ SOLN: 6.2500 mg | INTRAMUSCULAR | Status: DC | PRN

## 2021-09-26 MED ORDER — 0.9 % SODIUM CHLORIDE (POUR BTL) OPTIME
TOPICAL | Status: DC | PRN
  Administered 2021-09-26 (×2): 1000 mL

## 2021-09-26 MED ORDER — SODIUM CHLORIDE 0.9 % IV SOLN: INTRAVENOUS | Status: DC

## 2021-09-26 MED ORDER — ACETAMINOPHEN 325 MG PO TABS
650.0000 mg | ORAL_TABLET | Freq: Four times a day (QID) | ORAL | Status: DC | PRN
  Administered 2021-09-26: 650 mg via ORAL
  Filled 2021-09-26: qty 2

## 2021-09-26 MED ORDER — ONDANSETRON HCL 4 MG PO TABS: 4.0000 mg | ORAL_TABLET | Freq: Four times a day (QID) | ORAL | Status: DC | PRN

## 2021-09-26 MED ORDER — OXYCODONE HCL 5 MG PO TABS: 5.0000 mg | ORAL_TABLET | ORAL | Status: DC | PRN

## 2021-09-26 MED ORDER — BENAZEPRIL HCL 10 MG PO TABS
5.0000 mg | ORAL_TABLET | Freq: Every day | ORAL | Status: DC
Start: 2021-09-27 — End: 2021-09-26

## 2021-09-26 MED ORDER — HYDROMORPHONE HCL 1 MG/ML IJ SOLN
1.0000 mg | INTRAMUSCULAR | Status: DC | PRN
  Administered 2021-09-26 – 2021-09-27 (×3): 1 mg via INTRAVENOUS
  Filled 2021-09-26 (×3): qty 1

## 2021-09-26 MED ORDER — ORAL CARE MOUTH RINSE: 15.0000 mL | Freq: Once | OROMUCOSAL | Status: DC

## 2021-09-26 MED ORDER — BENAZEPRIL HCL 10 MG PO TABS: 5.0000 mg | ORAL_TABLET | Freq: Every day | ORAL | Status: DC

## 2021-09-26 MED ORDER — LIDOCAINE HCL (PF) 2 % IJ SOLN
INTRAMUSCULAR | Status: AC
  Filled 2021-09-26: qty 5

## 2021-09-26 MED ORDER — LIDOCAINE HCL (CARDIAC) PF 100 MG/5ML IV SOSY
PREFILLED_SYRINGE | INTRAVENOUS | Status: DC | PRN
  Administered 2021-09-26: 40 mg via INTRATRACHEAL

## 2021-09-26 MED ORDER — FENTANYL CITRATE (PF) 250 MCG/5ML IJ SOLN
INTRAMUSCULAR | Status: AC
  Filled 2021-09-26: qty 5

## 2021-09-26 MED ORDER — SIMETHICONE 80 MG PO CHEW
40.0000 mg | CHEWABLE_TABLET | Freq: Four times a day (QID) | ORAL | Status: DC | PRN
  Administered 2021-09-29 – 2021-09-30 (×2): 40 mg via ORAL
  Filled 2021-09-26 (×2): qty 1

## 2021-09-26 MED ORDER — ENOXAPARIN SODIUM 40 MG/0.4ML IJ SOSY
40.0000 mg | PREFILLED_SYRINGE | INTRAMUSCULAR | Status: DC
  Administered 2021-09-27 – 2021-10-01 (×5): 40 mg via SUBCUTANEOUS
  Filled 2021-09-26 (×5): qty 0.4

## 2021-09-26 MED ORDER — OXYCODONE HCL 5 MG PO TABS
5.0000 mg | ORAL_TABLET | ORAL | Status: DC | PRN
  Administered 2021-09-28 – 2021-10-01 (×7): 5 mg via ORAL
  Filled 2021-09-26 (×7): qty 1

## 2021-09-26 MED ORDER — HYDROMORPHONE HCL 1 MG/ML IJ SOLN
0.2500 mg | INTRAMUSCULAR | Status: DC | PRN
  Administered 2021-09-26 (×2): 0.5 mg via INTRAVENOUS
  Filled 2021-09-26 (×2): qty 0.5

## 2021-09-26 MED ORDER — ONDANSETRON HCL 4 MG/2ML IJ SOLN
INTRAMUSCULAR | Status: AC
  Filled 2021-09-26: qty 2

## 2021-09-26 MED ORDER — HEPARIN SODIUM (PORCINE) 5000 UNIT/ML IJ SOLN
5000.0000 [IU] | Freq: Three times a day (TID) | INTRAMUSCULAR | Status: DC
  Administered 2021-09-26: 5000 [IU] via SUBCUTANEOUS
  Filled 2021-09-26: qty 1

## 2021-09-26 MED ORDER — DEXTROSE 50 % IV SOLN
25.0000 mL | Freq: Once | INTRAVENOUS | Status: AC
  Administered 2021-09-26: 25 mL via INTRAVENOUS

## 2021-09-26 MED ORDER — CHLORHEXIDINE GLUCONATE 0.12 % MT SOLN
OROMUCOSAL | Status: AC
  Administered 2021-09-26: 15 mL
  Filled 2021-09-26: qty 15

## 2021-09-26 MED ORDER — KETOROLAC TROMETHAMINE 15 MG/ML IJ SOLN
15.0000 mg | Freq: Four times a day (QID) | INTRAMUSCULAR | Status: AC
  Administered 2021-09-26: 15 mg via INTRAVENOUS
  Filled 2021-09-26: qty 1

## 2021-09-26 MED ORDER — DIPHENHYDRAMINE HCL 12.5 MG/5ML PO ELIX: 12.5000 mg | ORAL_SOLUTION | Freq: Four times a day (QID) | ORAL | Status: DC | PRN

## 2021-09-26 MED ORDER — LIDOCAINE HCL (CARDIAC) PF 50 MG/5ML IV SOSY
PREFILLED_SYRINGE | INTRAVENOUS | Status: DC | PRN
  Administered 2021-09-26: 60 mg via INTRAVENOUS

## 2021-09-26 MED ORDER — PANTOPRAZOLE SODIUM 40 MG PO TBEC
40.0000 mg | DELAYED_RELEASE_TABLET | Freq: Every day | ORAL | Status: DC
Start: 2021-09-27 — End: 2021-10-01
  Administered 2021-09-27 – 2021-09-30 (×4): 40 mg via ORAL
  Filled 2021-09-26 (×4): qty 1

## 2021-09-26 MED ORDER — PROPOFOL 10 MG/ML IV BOLUS
INTRAVENOUS | Status: DC | PRN
  Administered 2021-09-26: 20 mg via INTRAVENOUS
  Administered 2021-09-26: 100 mg via INTRAVENOUS

## 2021-09-26 MED ORDER — PANTOPRAZOLE SODIUM 40 MG IV SOLR
40.0000 mg | Freq: Every day | INTRAVENOUS | Status: DC
  Administered 2021-09-26: 40 mg via INTRAVENOUS
  Filled 2021-09-26: qty 10

## 2021-09-26 MED ORDER — ESMOLOL HCL 100 MG/10ML IV SOLN
INTRAVENOUS | Status: DC | PRN
  Administered 2021-09-26: 20 mg via INTRAVENOUS

## 2021-09-26 MED ORDER — ONDANSETRON HCL 4 MG/2ML IJ SOLN
4.0000 mg | Freq: Four times a day (QID) | INTRAMUSCULAR | Status: DC | PRN
  Administered 2021-09-26: 4 mg via INTRAVENOUS
  Filled 2021-09-26: qty 2

## 2021-09-26 MED ORDER — DEXAMETHASONE SODIUM PHOSPHATE 10 MG/ML IJ SOLN
INTRAMUSCULAR | Status: DC | PRN
Start: 2021-09-26 — End: 2021-09-26
  Administered 2021-09-26: 5 mg via INTRAVENOUS

## 2021-09-26 MED ORDER — ONDANSETRON HCL 4 MG/2ML IJ SOLN
4.0000 mg | Freq: Four times a day (QID) | INTRAMUSCULAR | Status: DC | PRN
  Administered 2021-09-27: 4 mg via INTRAVENOUS
  Filled 2021-09-26: qty 2

## 2021-09-26 MED ORDER — ROCURONIUM BROMIDE 100 MG/10ML IV SOLN
INTRAVENOUS | Status: DC | PRN
  Administered 2021-09-26: 70 mg via INTRAVENOUS

## 2021-09-26 MED ORDER — HYDRALAZINE HCL 20 MG/ML IJ SOLN: 10.0000 mg | INTRAMUSCULAR | Status: DC | PRN

## 2021-09-26 MED ORDER — ALUM & MAG HYDROXIDE-SIMETH 200-200-20 MG/5ML PO SUSP: 30.0000 mL | Freq: Four times a day (QID) | ORAL | Status: DC | PRN

## 2021-09-26 MED ORDER — ESMOLOL HCL 100 MG/10ML IV SOLN
INTRAVENOUS | Status: AC
  Filled 2021-09-26: qty 10

## 2021-09-26 MED ORDER — ACETAMINOPHEN 650 MG RE SUPP: 650.0000 mg | Freq: Four times a day (QID) | RECTAL | Status: DC | PRN

## 2021-09-26 MED ORDER — ONDANSETRON HCL 4 MG/2ML IJ SOLN
INTRAMUSCULAR | Status: DC | PRN
  Administered 2021-09-26: 4 mg via INTRAVENOUS

## 2021-09-26 MED ORDER — CHLORHEXIDINE GLUCONATE CLOTH 2 % EX PADS: 6.0000 | MEDICATED_PAD | Freq: Once | CUTANEOUS | Status: DC

## 2021-09-26 MED ORDER — ROSUVASTATIN CALCIUM 20 MG PO TABS
20.0000 mg | ORAL_TABLET | Freq: Every day | ORAL | Status: DC
Start: 2021-09-26 — End: 2021-09-26

## 2021-09-26 MED ORDER — DIPHENHYDRAMINE HCL 50 MG/ML IJ SOLN: 12.5000 mg | Freq: Four times a day (QID) | INTRAMUSCULAR | Status: DC | PRN

## 2021-09-26 MED ORDER — BUPIVACAINE LIPOSOME 1.3 % IJ SUSP
INTRAMUSCULAR | Status: DC | PRN
  Administered 2021-09-26: 20 mL

## 2021-09-26 MED ORDER — CHLORHEXIDINE GLUCONATE 0.12 % MT SOLN: 15.0000 mL | Freq: Once | OROMUCOSAL | Status: DC

## 2021-09-26 SURGICAL SUPPLY — 46 items
BLADE SURG SZ11 CARB STEEL (BLADE) ×2 IMPLANT
CHLORAPREP W/TINT 26 (MISCELLANEOUS) ×2 IMPLANT
CLOTH BEACON ORANGE TIMEOUT ST (SAFETY) ×2 IMPLANT
COVER LIGHT HANDLE STERIS (MISCELLANEOUS) ×4 IMPLANT
DERMABOND ADVANCED (GAUZE/BANDAGES/DRESSINGS) ×1
DERMABOND ADVANCED .7 DNX12 (GAUZE/BANDAGES/DRESSINGS) ×1 IMPLANT
DRSG OPSITE POSTOP 4X8 (GAUZE/BANDAGES/DRESSINGS) ×1 IMPLANT
ELECT REM PT RETURN 9FT ADLT (ELECTROSURGICAL) ×2
ELECTRODE REM PT RTRN 9FT ADLT (ELECTROSURGICAL) ×1 IMPLANT
EVACUATOR DRAINAGE 10X20 100CC (DRAIN) IMPLANT
EVACUATOR SILICONE 100CC (DRAIN) ×2
GAUZE 4X4 16PLY ~~LOC~~+RFID DBL (SPONGE) ×2 IMPLANT
GLOVE BIOGEL PI IND STRL 7.0 (GLOVE) ×2 IMPLANT
GLOVE BIOGEL PI INDICATOR 7.0 (GLOVE) ×2
GLOVE SURG SS PI 7.0 STRL IVOR (GLOVE) ×4 IMPLANT
GLOVE SURG SS PI 7.5 STRL IVOR (GLOVE) ×3 IMPLANT
GOWN STRL REUS W/TWL LRG LVL3 (GOWN DISPOSABLE) ×6 IMPLANT
HANDLE SUCTION POOLE (INSTRUMENTS) IMPLANT
KIT TURNOVER KIT A (KITS) ×2 IMPLANT
LIGASURE IMPACT 36 18CM CVD LR (INSTRUMENTS) ×1 IMPLANT
MANIFOLD NEPTUNE II (INSTRUMENTS) ×2 IMPLANT
NDL HYPO 21X1.5 SAFETY (NEEDLE) ×1 IMPLANT
NEEDLE HYPO 21X1.5 SAFETY (NEEDLE) ×2 IMPLANT
NS IRRIG 1000ML POUR BTL (IV SOLUTION) ×3 IMPLANT
PACK MAJOR ABDOMINAL (CUSTOM PROCEDURE TRAY) IMPLANT
PACK MINOR (CUSTOM PROCEDURE TRAY) ×1 IMPLANT
PAD ARMBOARD 7.5X6 YLW CONV (MISCELLANEOUS) ×2 IMPLANT
RELOAD PROXIMATE 75MM BLUE (ENDOMECHANICALS) ×4 IMPLANT
RELOAD STAPLE 75 3.8 BLU REG (ENDOMECHANICALS) IMPLANT
SET BASIN LINEN APH (SET/KITS/TRAYS/PACK) ×2 IMPLANT
SPONGE DRAIN TRACH 4X4 STRL 2S (GAUZE/BANDAGES/DRESSINGS) ×1 IMPLANT
SPONGE T-LAP 18X18 ~~LOC~~+RFID (SPONGE) ×2 IMPLANT
STAPLER GUN LINEAR PROX 60 (STAPLE) ×1 IMPLANT
STAPLER PROXIMATE 75MM BLUE (STAPLE) ×1 IMPLANT
STAPLER VISISTAT (STAPLE) ×1 IMPLANT
SUCTION POOLE HANDLE (INSTRUMENTS) ×2
SUT CHROMIC 2 0 SH (SUTURE) ×1 IMPLANT
SUT ETHILON 3 0 FSL (SUTURE) ×1 IMPLANT
SUT MNCRL AB 4-0 PS2 18 (SUTURE) ×2 IMPLANT
SUT PDS AB CT VIOLET #0 27IN (SUTURE) ×4 IMPLANT
SUT SILK 3 0 SH CR/8 (SUTURE) ×2 IMPLANT
SUT VIC AB 2-0 CT1 27 (SUTURE) ×2
SUT VIC AB 2-0 CT1 TAPERPNT 27 (SUTURE) ×1 IMPLANT
SUT VIC AB 3-0 SH 27 (SUTURE) ×4
SUT VIC AB 3-0 SH 27X BRD (SUTURE) ×1 IMPLANT
SYR 20ML LL LF (SYRINGE) ×4 IMPLANT

## 2021-09-26 NOTE — H&P (Signed)
History and Physical    Patient: Kelly Savage SWN:[id number] DOB: 1955-09-22 DOA: 09/25/2021 DOS: the patient was seen and examined on 09/26/2021 PCP: Fayrene Helper, MD  Patient coming from: Home  Chief Complaint:  Chief Complaint  Patient presents with   Abdominal Pain   HPI: Kelly Savage is a 66 y.o. female with medical history significant of with history of diabetes mellitus type 2, hypertension, and more presents to the ED with a chief complaint of nausea vomiting and abdominal pain.  Patient states this has been going on for 3 weeks but has been especially worse over the last 4 days.  The pain is diffuse across her abdomen, intermittent, and feels like burning or cramping.  She describes the pain is severe, lasting seconds-minutes.  The pain is more frequent and worse at night.  Eating makes the pain worse.  Patient had 1 episode of explosive watery bowel movement.  That was 6 days ago.  No bowel movement since then.  Patient has been throwing up.  That is also worse at night and worse with eating.  On Monday she ate 3 bites of a baked potato and threw up for 5 times at night.  She has been afraid to eat because of that.  She denies any hematemesis.  Patient denies hematochezia and melena as well.  Patient reports that she feels weak all over, with headache, and feeling completely rundown.  She associates the symptoms with her abdominal pain and nausea.  She is taken Tylenol without relief.  She did go to another ER-[place]-was told she had a viral enteritis and was sent home.  She talked her PCP today who sent her back to [facility].  Patient's last colonoscopy was several years ago.  Her last Cologuard was last year.  Patient has no other complaints at this time.  Patient does not smoke, does not drink, does not use illicit drugs.  She is vaccinated for COVID.  Patient is full code. Review of Systems: As mentioned in the history of present illness. All other systems reviewed and are  negative. Past Medical History:  Diagnosis Date   Ankle pain x3 months   bilateral   Diabetes mellitus type II 2006   Diabetes mellitus without complication ([place])    Phreesia 07/11/2020   Obesity all her life    lost 28 lbs in 14 weeks    Primary localized osteoarthritis of knee    Left   Primary localized osteoarthritis of left knee 09/23/2017   Right thigh pain    Thigh pain x 1 year    bilateral, esp when laying down, worse in the morning , and goes away with activity   Past Surgical History:  Procedure Laterality Date   [place]     in childhood   TOTAL KNEE ARTHROPLASTY Left 10/18/2017   TOTAL KNEE ARTHROPLASTY Left 10/18/2017   Procedure: TOTAL KNEE ARTHROPLASTY;  Surgeon: Elsie Saas, MD;  Location: [place];  Service: Orthopedics;  Laterality: Left;   Social History:  reports that she has never smoked. She has never used smokeless tobacco. She reports that she does not currently use alcohol. She reports that she does not use drugs.  No Known Allergies  Family History  Problem Relation Age of Onset   Heart failure Mother 67       CVA (secondary to afib    Hyperlipidemia Father  Hypertension Father    Cancer Maternal Grandfather        colon    Diabetes Maternal Grandmother     Prior to Admission medications   Medication Sig Start Date End Date Taking? Authorizing Provider  aspirin EC 81 MG tablet Take 81 mg by mouth daily.    [provider]  benazepril (LOTENSIN) 5 MG tablet Take 1 tablet (5 mg total) by mouth daily. 03/25/21   Fayrene Helper, MD  blood glucose meter kit and supplies Dispense based on patient and insurance preference. Three times daily testing dx e11.65 01/31/19   Fayrene Helper, MD  calcium carbonate (OSCAL) 1500 (600 Ca) MG TABS tablet Take 1,200 mg by mouth every evening.    [provider]  COVID-19 mRNA Vac-TriS, Pfizer, (PFIZER-BIONT COVID-19  VAC-TRIS) SUSP injection Inject into the muscle. 11/14/20   Carlyle Basques, MD  diclofenac (VOLTAREN) 75 MG EC tablet Take 1 tablet (75 mg total) by mouth daily as needed for knee pain 01/10/21   Fayrene Helper, MD  empagliflozin (JARDIANCE) 10 MG TABS tablet Take 1 tablet (10 mg total) by mouth daily before breakfast. 08/12/21   Fayrene Helper, MD  glipiZIDE (GLUCOTROL XL) 10 MG 24 hr tablet Take 2 tablets (20 mg total) by mouth in the morning at breakfast 03/25/21   Fayrene Helper, MD  glipiZIDE (GLUCOTROL) 5 MG tablet Take 1 tablet (5 mg total) by mouth daily 30 minutes before breakfast 04/29/21     glucose blood (FREESTYLE LITE) test strip Use as instructed to test blood sugar once daily. 10/10/20   Fayrene Helper, MD  Lancets (FREESTYLE) lancets Use as instructed to test blood sugar once daily. 10/10/20   Fayrene Helper, MD  metFORMIN (GLUCOPHAGE) 500 MG tablet Take 2 tablets (1,000 mg total) by mouth 2 (two) times daily for blood sugar. 05/21/21   Fayrene Helper, MD  ondansetron (ZOFRAN-ODT) 4 MG disintegrating tablet Allow1-2 tablets (4-8 mg total) to dissolve in the mouth every 8 (eight) hours as needed for nausea/vomiting 09/23/21   Hinda Kehr, MD  rosuvastatin (CRESTOR) 20 MG tablet Take 1 tablet (20 mg total) by mouth daily. 07/04/21   Fayrene Helper, MD  tirzepatide [facility]) 10 MG/0.5ML Pen Inject 10 mg into the skin once a week. Patient not taking: Reported on 09/25/2021 04/17/21     tirzepatide (MOUNJARO) 7.5 MG/0.5ML Pen Inject 7.5 mg into the skin once a week. Patient not taking: Reported on 09/25/2021 06/13/21     SEMGLEE 100 UNIT/ML injection Inject 0.35 mLs (35 Units total) into the skin daily. 08/22/20 08/23/20  Fayrene Helper, MD    Physical Exam: Vitals:   09/25/21 2000 09/25/21 2200 09/25/21 2306 09/26/21 0100  BP: 132/73 117/67 126/64 (!) 141/74  Pulse: 80  85 82  Resp: _0 Temp:    98.1 F (36.7 C)  TempSrc:    Oral  SpO2: 99%  97% 98% 97%  Weight:      Height:       1.  General: Patient lying supine in bed,  no acute distress   2. Psychiatric: Alert and oriented x 3, mood and behavior normal for situation, pleasant and cooperative with exam   3. Neurologic: Speech and language are normal, face is symmetric, moves all 4 extremities voluntarily, at baseline without acute deficits on limited exam   4. HEENMT:  Head is atraumatic, normocephalic, pupils reactive to light, neck is supple, trachea is midline,  mucous membranes are moist   5. Respiratory : Lungs are clear to auscultation bilaterally without wheezing, rhonchi, rales, no cyanosis, no increase in work of breathing or accessory muscle use   6. Cardiovascular : Heart rate normal, rhythm is regular, no murmurs, rubs or gallops, no peripheral edema, peripheral pulses palpated   7. Gastrointestinal:  Abdomen is soft, mildly distended, diffuse tenderness to palpation, bowel sounds active, no masses or organomegaly palpated   8. Skin:  Skin is warm, dry and intact without rashes, acute lesions, or ulcers on limited exam   9.Musculoskeletal:  No acute deformities or trauma, no asymmetry in tone, no peripheral edema, peripheral pulses palpated, no tenderness to palpation in the extremities  Data Reviewed: In the ED Temp 97.8, heart rate 80-96, respiratory rate 16-20, blood pressure 126/64, satting 98% No leukocytosis with a white blood cell count of 7.7, hemoglobin 10.1, platelets 303 Chemistry shows an elevated T. bili at 2.5 and is otherwise unremarkable Lactic acid ordered at admission is 1.3 and then 0.7 Dr. Arnoldo Morale was consulted from the ED and plans to see the patient in the a.m. Admission was requested for small bowel obstruction  Assessment and Plan: * Small bowel obstruction (HCC) - CT abdomen pelvis shows umbilical hernia containing a loop of small bowel with marked wall thickening and surrounding inflammation-resultant small bowel  obstruction.  Findings worrisome for incarcerated/strangulated hernia -Dr. Arnoldo Morale was consulted from the ED and plans to see the patient in the a.m. -Pain control with pain scale -Nausea control with Zofran and Compazine -N.p.o. -Continue to monitor  Essential hypertension Continue benazepril Blood pressure 126/64 at admission-well-controlled  Hyperlipidemia with target LDL less than 100 Continue Crestor  Type 2 diabetes mellitus with vascular disease ([place]) - Holding home oral hypoglycemics -Sliding scale coverage while in the hospital -Last hemoglobin A1c was 6.6 -Continue to monitor      Advance Care Planning:   Code Status: Prior full  Consults: Dr. Arnoldo Morale  Family Communication: No family at bedside  Severity of Illness: The appropriate patient status for this patient is INPATIENT. Inpatient status is judged to be reasonable and necessary in order to provide the required intensity of service to ensure the patient's safety. The patient's presenting symptoms, physical exam findings, and initial radiographic and laboratory data in the context of their chronic comorbidities is felt to place them at high risk for further clinical deterioration. Furthermore, it is not anticipated that the patient will be medically stable for discharge from the hospital within 2 midnights of admission.   * I certify that at the point of admission it is my clinical judgment that the patient will require inpatient hospital care spanning beyond 2 midnights from the point of admission due to high intensity of service, high risk for further deterioration and high frequency of surveillance required.*  Author: Rolla Plate, DO 09/26/2021 5:47 AM  For on call review [email or web address].

## 2021-09-26 NOTE — Progress Notes (Signed)
Pt off unit for surgery with Dr. Arnoldo Morale

## 2021-09-26 NOTE — Assessment & Plan Note (Addendum)
-  Postop day # 3 -09/26/2021 status post partial small bowel resection, right hernia repair repeat drain in place  -Reporting of gas and bowel movement this morning -  - CT abdomen pelvis shows umbilical hernia containing a loop of small bowel with marked wall thickening and surrounding inflammation-resultant small bowel obstruction.  Findings worrisome for incarcerated/strangulated hernia -Dr. Lovell Sheehan following  -Pain control with pain scale -Nausea control with Zofran and Compazine

## 2021-09-26 NOTE — Anesthesia Postprocedure Evaluation (Signed)
Anesthesia Post Note  Patient: Kelly Savage  Procedure(s) Performed: HERNIORRHAPHY INCISIONAL (Abdomen) PARTIAL SMALL BOWEL RESECTION (Abdomen)  Patient location during evaluation: PACU Anesthesia Type: General Level of consciousness: awake and alert and oriented Pain management: pain level controlled Vital Signs Assessment: post-procedure vital signs reviewed and stable Respiratory status: spontaneous breathing, nonlabored ventilation, respiratory function stable and patient connected to nasal cannula oxygen Cardiovascular status: blood pressure returned to baseline and stable Postop Assessment: no apparent nausea or vomiting Anesthetic complications: no   No notable events documented.   Last Vitals:  Vitals:   09/26/21 1625 09/26/21 1630  BP: 115/60 116/64  Pulse: 83 81  Resp:  13  Temp: 36.7 C   SpO2:  98%    Last Pain:  Vitals:   09/26/21 1325  TempSrc:   PainSc: 0-No pain                 Kieren Adkison C Calina Patrie

## 2021-09-26 NOTE — H&P (View-Only) (Signed)
Reason for Consult: Small bowel obstruction secondary to incisional hernia Referring Physician: Dr. Adela Lank is an 66 y.o. female.  HPI: Patient is a 66 year old white female who has had multiple abdominal surgeries in the past who presented to the emergency room with worsening nausea, vomiting, and abdominal pain.  She has a known periumbilical hernia but it became larger and firmer.  CT scan of the abdomen did reveal an incarcerated ventral hernia.  It was reducible in the emergency room but popped back out.  She was admitted to the hospital for further evaluation and treatment.  She denies any significant abdominal pain at the present time.  She has been n.p.o.  Past Medical History:  Diagnosis Date   Ankle pain x3 months   bilateral   Diabetes mellitus type II 2006   Diabetes mellitus without complication ([place])    Phreesia 07/11/2020   Obesity all her life    lost 28 lbs in 14 weeks    Primary localized osteoarthritis of knee    Left   Primary localized osteoarthritis of left knee 09/23/2017   Right thigh pain    Thigh pain x 1 year    bilateral, esp when laying down, worse in the morning , and goes away with activity    Past Surgical History:  Procedure Laterality Date   [place]     in childhood   TOTAL KNEE ARTHROPLASTY Left 10/18/2017   TOTAL KNEE ARTHROPLASTY Left 10/18/2017   Procedure: TOTAL KNEE ARTHROPLASTY;  Surgeon: Elsie Saas, MD;  Location: [facility];  Service: Orthopedics;  Laterality: Left;    Family History  Problem Relation Age of Onset   Heart failure Mother 29       CVA (secondary to afib    Hyperlipidemia Father    Hypertension Father    Cancer Maternal Grandfather        colon    Diabetes Maternal Grandmother     Social History:  reports that she has never smoked. She has never used smokeless tobacco. She reports that she does not currently use alcohol. She reports  that she does not use drugs.  Allergies: No Known Allergies  Medications: I have reviewed the patient's current medications. Prior to Admission:  Medications Prior to Admission  Medication Sig Dispense Refill Last Dose   aspirin EC 81 MG tablet Take 81 mg by mouth daily.      benazepril (LOTENSIN) 5 MG tablet Take 1 tablet (5 mg total) by mouth daily. 90 tablet 1    blood glucose meter kit and supplies Dispense based on patient and insurance preference. Three times daily testing dx e11.65 1 each 0    calcium carbonate (OSCAL) 1500 (600 Ca) MG TABS tablet Take 1,200 mg by mouth every evening.      COVID-19 mRNA Vac-TriS, Pfizer, (PFIZER-BIONT COVID-19 VAC-TRIS) SUSP injection Inject into the muscle. 0.3 mL 0    diclofenac (VOLTAREN) 75 MG EC tablet Take 1 tablet (75 mg total) by mouth daily as needed for knee pain 60 tablet 3    empagliflozin (JARDIANCE) 10 MG TABS tablet Take 1 tablet (10 mg total) by mouth daily before breakfast. 90 tablet 2    glipiZIDE (GLUCOTROL XL) 10 MG 24 hr tablet Take 2 tablets (20 mg total) by mouth in the morning at breakfast 180 tablet 1    glipiZIDE (GLUCOTROL) 5 MG tablet Take 1 tablet (5  mg total) by mouth daily 30 minutes before breakfast 30 tablet 1    glucose blood (FREESTYLE LITE) test strip Use as instructed to test blood sugar once daily. 100 each 11    Lancets (FREESTYLE) lancets Use as instructed to test blood sugar once daily. 100 each 2    metFORMIN (GLUCOPHAGE) 500 MG tablet Take 2 tablets (1,000 mg total) by mouth 2 (two) times daily for blood sugar. 180 tablet 1    ondansetron (ZOFRAN-ODT) 4 MG disintegrating tablet Allow1-2 tablets (4-8 mg total) to dissolve in the mouth every 8 (eight) hours as needed for nausea/vomiting 30 tablet 1    rosuvastatin (CRESTOR) 20 MG tablet Take 1 tablet (20 mg total) by mouth daily. 90 tablet 3    tirzepatide (MOUNJARO) 10 MG/0.5ML Pen Inject 10 mg into the skin once a week. (Patient not taking: Reported on  09/25/2021) 2 mL 6    tirzepatide (MOUNJARO) 7.5 MG/0.5ML Pen Inject 7.5 mg into the skin once a week. (Patient not taking: Reported on 09/25/2021) 2 mL 6     Results for orders placed or performed during the hospital encounter of 09/25/21 (from the past 48 hour(s))  Lipase, blood     Status: None   Collection Time: 09/25/21  4:24 PM  Result Value Ref Range   Lipase 23 11 - 51 U/L    Comment: Performed at [facility], [address], [place], [place] [address]  Comprehensive metabolic panel     Status: Abnormal   Collection Time: 09/25/21  4:24 PM  Result Value Ref Range   Sodium 132 (L) 135 - 145 mmol/L   Potassium 4.3 3.5 - 5.1 mmol/L   Chloride 95 (L) 98 - 111 mmol/L   CO2 20 (L) 22 - 32 mmol/L   Glucose, Bld 136 (H) 70 - 99 mg/dL    Comment: Glucose reference range applies only to samples taken after fasting for at least 8 hours.   BUN 21 8 - 23 mg/dL   Creatinine, Ser 0.76 0.44 - 1.00 mg/dL   Calcium 9.1 8.9 - 10.3 mg/dL   Total Protein 7.3 6.5 - 8.1 g/dL   Albumin 4.2 3.5 - 5.0 g/dL   AST 17 15 - 41 U/L   ALT 17 0 - 44 U/L   Alkaline Phosphatase 58 38 - 126 U/L   Total Bilirubin 2.5 (H) 0.3 - 1.2 mg/dL   GFR, Estimated >60 >60 mL/min    Comment: (NOTE) Calculated using the CKD-EPI Creatinine Equation (2021)    Anion gap 17 (H) 5 - 15    Comment: Performed at [facility], [address], [place], [place] [address]  CBC     Status: Abnormal   Collection Time: 09/25/21  4:24 PM  Result Value Ref Range   WBC 7.7 4.0 - 10.5 K/uL   RBC 4.94 3.87 - 5.11 MIL/uL   Hemoglobin 15.1 (H) 12.0 - 15.0 g/dL   HCT 45.1 36.0 - 46.0 %   MCV 91.3 80.0 - 100.0 fL   MCH 30.6 26.0 - 34.0 pg   MCHC 33.5 30.0 - 36.0 g/dL   RDW 13.2 11.5 - 15.5 %   Platelets 303 150 - 400 K/uL   nRBC 0.0 0.0 - 0.2 %    Comment: Performed at [facility], [address], [place], [place] [address]  Lactic acid, plasma     Status: None   Collection Time: 09/25/21  4:27 PM  Result Value Ref Range   Lactic  Acid, Venous  1.3 0.5 - 1.9 mmol/L    Comment: Performed at [facility], [address], [place], [place] [address]  Urinalysis, Routine w reflex microscopic Urine, Clean Catch     Status: Abnormal   Collection Time: 09/26/21 12:28 AM  Result Value Ref Range   Color, Urine YELLOW YELLOW   APPearance CLEAR CLEAR   Specific Gravity, Urine >1.046 (H) 1.005 - 1.030   pH 5.0 5.0 - 8.0   Glucose, UA >=500 (A) NEGATIVE mg/dL   Hgb urine dipstick SMALL (A) NEGATIVE   Bilirubin Urine NEGATIVE NEGATIVE   Ketones, ur 80 (A) NEGATIVE mg/dL   Protein, ur NEGATIVE NEGATIVE mg/dL   Nitrite NEGATIVE NEGATIVE   Leukocytes,Ua SMALL (A) NEGATIVE   RBC / HPF 0-5 0 - 5 RBC/hpf   WBC, UA 6-10 0 - 5 WBC/hpf   Bacteria, UA NONE SEEN NONE SEEN   Squamous Epithelial / LPF 0-5 0 - 5   Mucus PRESENT     Comment: Performed at [facility], [address], [place], [place] [address]  Lactic acid, plasma     Status: None   Collection Time: 09/26/21  3:43 AM  Result Value Ref Range   Lactic Acid, Venous 0.7 0.5 - 1.9 mmol/L    Comment: Performed at [facility], [address] [place], [place] [address]  Glucose, capillary     Status: Abnormal   Collection Time: 09/26/21  7:25 AM  Result Value Ref Range   Glucose-Capillary 106 (H) 70 - 99 mg/dL    Comment: Glucose reference range applies only to samples taken after fasting for at least 8 hours.  Magnesium     Status: None   Collection Time: 09/26/21  7:34 AM  Result Value Ref Range   Magnesium 1.9 1.7 - 2.4 mg/dL    Comment: Performed at [facility], [address], [place], [place] [address]    CT ABDOMEN PELVIS W CONTRAST  Result Date: 09/25/2021 CLINICAL DATA:  Bowel obstruction suspected.  Nausea and vomiting. EXAM: CT ABDOMEN AND PELVIS WITH CONTRAST TECHNIQUE: Multidetector CT imaging of the abdomen and pelvis was performed using the standard protocol following bolus administration of intravenous contrast. RADIATION DOSE REDUCTION: This exam was  performed according to the departmental dose-optimization program which includes automated exposure control, adjustment of the mA and/or kV according to patient size and/or use of iterative reconstruction technique. CONTRAST:  11m OMNIPAQUE IOHEXOL 300 MG/ML  SOLN COMPARISON:  CT abdomen and pelvis 10/05/2007. FINDINGS: Lower chest: No acute abnormality. Hepatobiliary: Gallbladder surgically absent. No biliary ductal dilatation. There is a hypodensity in the right lobe of the liver which is too small to characterize measuring 8 mm image 2/31. This was not seen in 2009. Pancreas: Unremarkable. No pancreatic ductal dilatation or surrounding inflammatory changes. Spleen: Normal in size without focal abnormality. Adrenals/Urinary Tract: Adrenal glands are unremarkable. Kidneys are normal, without renal calculi, focal lesion, or hydronephrosis. Bladder is unremarkable. Stomach/Bowel: There is an umbilical hernia which is moderate in size containing a loop of small bowel with marked wall thickening and surrounding mesenteric edema/inflammation. This is transition point for small bowel obstruction. Small bowel loops proximal to this level are dilated with air-fluid levels measuring up to 4 cm. Stomach is nondilated. Colon is nondilated. Appendix not visualized. There is sigmoid colon diverticulosis without evidence for acute diverticulitis. There is some mild wall thickening of short segment small bowel in the right lower quadrant, indeterminate. No free air. Vascular/Lymphatic: No significant vascular findings are present. No enlarged abdominal  or pelvic lymph nodes. Reproductive: Uterus and bilateral adnexa are unremarkable. Other: There is a moderate-sized fat containing hernia, right of midline near the umbilicus containing a small amount of free fluid. There is a fat containing umbilical hernia as described above. No free air. Musculoskeletal: Severe degenerative changes affect the spine. IMPRESSION: 1. Umbilical  hernia containing a loop of small bowel with marked wall thickening and surrounding inflammation. There is resultant small bowel obstruction. Findings are worrisome for incarcerated/strangulated hernia. Surgical consultation recommended. 2. Additional ventral hernia containing fluid and fat, just right of the umbilicus. 3. Sigmoid colon diverticulosis. 4. Hypodense lesion in the liver is too small to characterize in new from 2009. This can be further evaluated with ultrasound or MRI as clinically appropriate. Electronically Signed   By: Ronney Asters M.D.   On: 09/25/2021 22:47    ROS:  Pertinent items are noted in HPI.  Blood pressure 116/68, pulse 83, temperature 98.2 F (36.8 C), temperature source Oral, resp. rate 18, height _0  (1.727 m), weight 79.9 kg, SpO2 99 %. Physical Exam: Pleasant well-developed well-nourished white female no acute distress Head is normocephalic, atraumatic Lungs clear to auscultation with equal breath sounds bilaterally Heart examination reveals regular rate and rhythm without S3, S4, murmurs Abdomen is soft with a large partially reducible periumbilical hernia.  Bowel is present.  Patient is nontender.  CT scan images personally reviewed  Assessment/Plan: Impression: Small bowel obstruction secondary to an incisional hernia. Plan: Patient will be taken to the operating room today for an incisional herniorrhaphy with mesh.  The risks and benefits of the procedure including bleeding, infection, mesh use, and the possibility of a bowel resection were fully explained to the patient, who gave informed consent.  Patient will be transferred to my service.  Aviva Signs 09/26/2021, 8:17 AM

## 2021-09-26 NOTE — Assessment & Plan Note (Signed)
Continue Crestor 

## 2021-09-26 NOTE — Hospital Course (Signed)
  Kelly Savage is a 66 y.o. female with medical history significant of with history of diabetes mellitus type 2, hypertension, and more presents to the ED with a chief complaint of nausea vomiting and abdominal pain.   Patient states this has been going on for 3 weeks but has been especially worse over the last 4 days.  The pain is diffuse across her abdomen, intermittent, and feels like burning or cramping.  She describes the pain is severe, lasting seconds-minutes.  The pain is more frequent and worse at night.  Eating makes the pain worse.  Patient had 1 episode of explosive watery bowel movement.  That was 6 days ago.  No bowel movement since then.  Patient has been throwing up.  That is also worse at night and worse with eating.    She denies any hematemesis.  Patient denies hematochezia and melena as well. Patient's last colonoscopy was several years ago.  Her last Cologuard was last year.  Patient does not smoke, does not drink, does not use illicit drugs.   She is vaccinated for COVID.  Patient is full code.

## 2021-09-26 NOTE — Op Note (Signed)
Patient:  Kelly Savage  DOB:  [birth year]  MRN:  [id number]   Preop Diagnosis: Incarcerated incisional hernia  Postop Diagnosis: Same, ischemic and strictured small bowel, small bowel diverticulum  Procedure: Partial small bowel resection, incisional herniorrhaphy  Surgeon: Franky Macho, MD  Anes: General endotracheal  Indications: Patient is a 66 year old white female status post multiple abdominal surgeries in the past who presented to the emergency room with worsening abdominal pain and nausea.  CT scan of the abdomen revealed a ventral hernia in the umbilical region with incarcerated small bowel.  The patient now comes to the operating room for exploratory laparotomy, incisional herniorrhaphy, possible small bowel resection.  The risks and benefits of the procedures including bleeding, infection, the possibility of a bowel resection, and the possibility of recurrence of the hernia were fully explained to the patient, who gave informed consent.  Procedure note: The patient was placed in the supine position.  After induction of general endotracheal anesthesia, the abdomen was prepped and draped using usual sterile technique with ChloraPrep.  Surgical site confirmation was performed.  An infraumbilical incision was made transversely.  This was taken down to the fascia.  2 hernia sacs were identified.  I did enter into one of the hernia sacs and incarcerated omentum was present.  In order to reduce this, a LigaSure was used to excise the excess omentum.  A second hernia sac contained incarcerated loop of bowel.  This was freed away but looked chronically inflamed with evidence of a stricture present.  In addition, there was a diverticulum present just distal to the affected small bowel.  There was no evidence of perforation.  I elected to do a limited small bowel resection.  A GIA 75 stapler was placed proximally distally across the affected area.  This affected area was approximately 10-12  centimeters.  The mesentery was divided using the LigaSure.  The specimen was then removed from the operative field.  A side-to-side enteroenterostomy was performed using a GIA 75 stapler.  The enterotomy was closed using a TA 60 stapler.  The staple line was bolstered using 3-0 silk Lembert sutures.  The mesenteric defect was closed using 2-0 Chromic Gut interrupted sutures.  The bowel was then returned into the abdominal cavity in orderly fashion.  The surrounding bowel was inspected and no other changes were noted.  The hernia sacs were excised down to the fascia.  As this case involved a small bowel resection, I elected to proceed with primary closure of the abdominal defect.  There was significant laxity of the abdominal wall.  The fascia was closed transversely using 0 PDS interrupted sutures.  The base of the umbilicus was secured back to the fascia using a 2-0 Vicryl interrupted suture.  A #10 flat Jackson-Pratt drain was placed over the fascia and brought out through a separate stab wound to the right of the incision.  It was secured into place using a 3-0 nylon interrupted suture.  The subcutaneous layer was reapproximated using 3-0 Vicryl interrupted sutures.  Exparel was instilled into the surrounding wound.  The skin was closed using staples.  Betadine ointment and dry sterile dressings were applied.  All tape and needle counts were correct at the end of the procedure.  The patient was extubated in the operating room and transferred to PACU in stable condition.  Complications: None  EBL: 50 cc  Specimen: Small bowel, omentum, hernia sac  Drain: Jackson-Pratt drain to subcutaneous tissue of incision

## 2021-09-26 NOTE — Anesthesia Preprocedure Evaluation (Signed)
Anesthesia Evaluation  Patient identified by MRN, date of birth, ID band Patient awake    Reviewed: Allergy & Precautions, NPO status , Patient's Chart, lab work & pertinent test results  Airway Mallampati: I  TM Distance: >3 FB Neck ROM: Full    Dental  (+) Dental Advisory Given, Missing, Chipped   Pulmonary neg pulmonary ROS,    Pulmonary exam normal breath sounds clear to auscultation       Cardiovascular Exercise Tolerance: Good hypertension, Pt. on medications Normal cardiovascular exam Rhythm:Regular Rate:Normal     Neuro/Psych  Neuromuscular disease negative psych ROS   GI/Hepatic negative GI ROS, Neg liver ROS,   Endo/Other  diabetes, Well Controlled, Type 2, Oral Hypoglycemic Agents  Renal/GU negative Renal ROS  negative genitourinary   Musculoskeletal  (+) Arthritis , Osteoarthritis,    Abdominal   Peds negative pediatric ROS (+)  Hematology negative hematology ROS (+)   Anesthesia Other Findings FSBS - 76 - 25 ml of dextrose 50 was  given  Reproductive/Obstetrics negative OB ROS                           Anesthesia Physical Anesthesia Plan  ASA: 2 and emergent  Anesthesia Plan: General   Post-op Pain Management: Dilaudid IV   Induction: Intravenous  PONV Risk Score and Plan: 4 or greater and Ondansetron and Dexamethasone  Airway Management Planned: Oral ETT  Additional Equipment:   Intra-op Plan:   Post-operative Plan: Extubation in OR and Possible Post-op intubation/ventilation  Informed Consent: I have reviewed the patients History and Physical, chart, labs and discussed the procedure including the risks, benefits and alternatives for the proposed anesthesia with the patient or authorized representative who has indicated his/her understanding and acceptance.     Dental advisory given  Plan Discussed with: CRNA and Surgeon  Anesthesia Plan Comments:          Anesthesia Quick Evaluation

## 2021-09-26 NOTE — Progress Notes (Signed)
PROGRESS NOTE    Patient: Kelly Savage                            PCP: Kerri Perches, MD                    DOB: 05/08/1955            DOA: 09/25/2021 EVO:[id number]             DOS: 09/26/2021, 10:46 AM   LOS: 0 days   Date of Service: The patient was seen and examined on 09/26/2021  Subjective:   The patient was seen and examined this morning. Stable at this time. Still complaining of : Generalized abdominal discomfort, tolerable with analgesics, improved nausea vomiting.  Otherwise no issues overnight .  Brief Narrative:    Kelly Savage is a 66 y.o. female with medical history significant of with history of diabetes mellitus type 2, hypertension, and more presents to the ED with a chief complaint of nausea vomiting and abdominal pain.   Patient states this has been going on for 3 weeks but has been especially worse over the last 4 days.  The pain is diffuse across her abdomen, intermittent, and feels like burning or cramping.  She describes the pain is severe, lasting seconds-minutes.  The pain is more frequent and worse at night.  Eating makes the pain worse.  Patient had 1 episode of explosive watery bowel movement.  That was 6 days ago.  No bowel movement since then.  Patient has been throwing up.  That is also worse at night and worse with eating.    She denies any hematemesis.  Patient denies hematochezia and melena as well. Patient's last colonoscopy was several years ago.  Her last Cologuard was last year.  Patient does not smoke, does not drink, does not use illicit drugs.   She is vaccinated for COVID.  Patient is full code.    Assessment & Plan:   Principal Problem:   Small bowel obstruction (HCC) Active Problems:   Type 2 diabetes mellitus with vascular disease (HCC)   Hyperlipidemia with target LDL less than 100   Essential hypertension     Assessment and Plan: * Small bowel obstruction (HCC) - CT abdomen pelvis shows umbilical hernia containing a loop  of small bowel with marked wall thickening and surrounding inflammation-resultant small bowel obstruction.  Findings worrisome for incarcerated/strangulated hernia -Dr. Lovell Sheehan has seen evaluate the patient, will take the patient to or later today 09/26/2021  -Pain control with pain scale -Nausea control with Zofran and Compazine -N.p.o. -Continue to monitor   Patient is currently hemodynamically stable, mild- risk for complication from surgical intervention, due to her comorbidities of diabetes, hypertension. Benefit outweighs the risks of surgical intervention.   Patient is cleared for surgery  Essential hypertension Continue benazepril Blood pressure 126/64 at admission-well-controlled  Hyperlipidemia with target LDL less than 100 Continue Crestor  Type 2 diabetes mellitus with vascular disease (HCC) - Holding home oral hypoglycemics -N.p.o. -Last hemoglobin A1c was 6.6 -Monitoring CBG every 4 hours, with SSI coverage    ---------------------------------------------------------------------------------------------------------------------  DVT prophylaxis:  heparin injection 5,000 Units Start: 09/26/21 1000 SCDs Start: 09/26/21 0641   Code Status:   Code Status: Full Code  Family Communication: No family member present at bedside- attempt will be made to update daily The above findings and plan of care has been discussed with patient (and family)  in detail,  they expressed understanding and agreement of above. -Advance care planning has been discussed.   Admission status:   Status is: Inpatient Remains inpatient appropriate because: Needing IV fluid, n.p.o., needing surgery evaluation possible surgical intervention     Procedures:   No admission procedures for hospital encounter.   Antimicrobials:  Anti-infectives (From admission, onward)    None        Medication:   [START ON 09/27/2021] benazepril  5 mg Oral Daily   heparin  5,000 Units Subcutaneous  Q8H   insulin aspart  0-15 Units Subcutaneous TID WC   insulin aspart  0-5 Units Subcutaneous QHS   pantoprazole (PROTONIX) IV  40 mg Intravenous QHS   [START ON 09/27/2021] rosuvastatin  20 mg Oral Daily    acetaminophen **OR** acetaminophen, hydrALAZINE, morphine injection, ondansetron **OR** ondansetron (ZOFRAN) IV, oxyCODONE, prochlorperazine   Objective:   Vitals:   09/25/21 2200 09/25/21 2306 09/26/21 0100 09/26/21 0547  BP: 117/67 126/64 (!) 141/74 116/68  Pulse:  85 82 83  Resp: 17 19 18    Temp:   98.1 F (36.7 C) 98.2 F (36.8 C)  TempSrc:   Oral Oral  SpO2: 97% 98% 97% 99%  Weight:      Height:       No intake or output data in the 24 hours ending 09/26/21 1046 Filed Weights   09/25/21 1547  Weight: 79.9 kg     Examination:   Physical Exam  Constitution:  Alert, cooperative, no distress,  Appears calm and comfortable  Psychiatric:   Normal and stable mood and affect, cognition intact,   HEENT:        Normocephalic, PERRL, otherwise with in Normal limits  Chest:         Chest symmetric Cardio vascular:  S1/S2, RRR, No murmure, No Rubs or Gallops  pulmonary: Clear to auscultation bilaterally, respirations unlabored, negative wheezes / crackles Abdomen: Soft, mild diffuse tenderness, ventral hernia,, hypoactive bowel sounds Muscular skeletal: Limited exam - in bed, able to move all 4 extremities,   Neuro: CNII-XII intact. , normal motor and sensation, reflexes intact  Extremities: No pitting edema lower extremities, +2 pulses  Skin: Dry, warm to touch, negative for any Rashes, No open wounds Wounds: per nursing documentation   ------------------------------------------------------------------------------------------------------------------------------------------    LABs:     Latest Ref Rng & Units 09/25/2021    4:24 PM 09/23/2021    2:39 AM 06/03/2020    9:17 AM  CBC  WBC 4.0 - 10.5 K/uL 7.7   11.6   6.5    Hemoglobin 12.0 - 15.0 g/dL 06/05/2020   16.1   09.6     Hematocrit 36.0 - 46.0 % 45.1   42.2   40.9    Platelets 150 - 400 K/uL 303   243   218        Latest Ref Rng & Units 09/25/2021    4:24 PM 09/23/2021    2:39 AM 07/02/2021    8:38 AM  CMP  Glucose 70 - 99 mg/dL 09/01/2021   409   92    BUN 8 - 23 mg/dL 21   16   13     Creatinine 0.44 - 1.00 mg/dL 811     9.14    Sodium 135 - 145 mmol/L 132   136   139    Potassium 3.5 - 5.1 mmol/L 4.3   3.5   3.6    Chloride 98 - 111 mmol/L 95  99   101    CO2 22 - 32 mmol/L 20   24   24     Calcium 8.9 - 10.3 mg/dL 9.1   9.4   9.3    Total Protein 6.5 - 8.1 g/dL 7.3   6.9   6.5    Total Bilirubin 0.3 - 1.2 mg/dL 2.5   2.9   0.8    Alkaline Phos 38 - 126 U/L 58   54   64    AST 15 - 41 U/L 17   18   21     ALT 0 - 44 U/L 17   14   18          Micro Results No results found for this or any previous visit (from the past 240 hour(s)).  Radiology Reports DG Abd 1 View  Result Date: 09/26/2021 CLINICAL DATA:  Abdominal pain EXAM: ABDOMEN - 1 VIEW COMPARISON:  CT abdomen and pelvis 09/25/2021 FINDINGS: Abnormally distended gas-filled loops of small bowel visualized in the mid abdomen measuring up to 4.6 cm in diameter. Retained fecal material in the right colon. Cholecystectomy clips. Excreted contrast in the urinary bladder. No nephrolithiasis visualized. IMPRESSION: Abnormal bowel-gas pattern suggesting obstruction as seen on CT. Continued follow-up recommended. Electronically Signed   By: Jannifer Hickelaney  Williams M.D.   On: 09/26/2021 08:42   CT ABDOMEN PELVIS W CONTRAST  Result Date: 09/25/2021 CLINICAL DATA:  Bowel obstruction suspected.  Nausea and vomiting. EXAM: CT ABDOMEN AND PELVIS WITH CONTRAST TECHNIQUE: Multidetector CT imaging of the abdomen and pelvis was performed using the standard protocol following bolus administration of intravenous contrast. RADIATION DOSE REDUCTION: This exam was performed according to the departmental dose-optimization program which includes automated exposure control,  adjustment of the mA and/or kV according to patient size and/or use of iterative reconstruction technique. CONTRAST:  100mL OMNIPAQUE IOHEXOL 300 MG/ML  SOLN COMPARISON:  CT abdomen and pelvis 10/05/2007. FINDINGS: Lower chest: No acute abnormality. Hepatobiliary: Gallbladder surgically absent. No biliary ductal dilatation. There is a hypodensity in the right lobe of the liver which is too small to characterize measuring 8 mm image 2/31. This was not seen in 2009. Pancreas: Unremarkable. No pancreatic ductal dilatation or surrounding inflammatory changes. Spleen: Normal in size without focal abnormality. Adrenals/Urinary Tract: Adrenal glands are unremarkable. Kidneys are normal, without renal calculi, focal lesion, or hydronephrosis. Bladder is unremarkable. Stomach/Bowel: There is an umbilical hernia which is moderate in size containing a loop of small bowel with marked wall thickening and surrounding mesenteric edema/inflammation. This is transition point for small bowel obstruction. Small bowel loops proximal to this level are dilated with air-fluid levels measuring up to 4 cm. Stomach is nondilated. Colon is nondilated. Appendix not visualized. There is sigmoid colon diverticulosis without evidence for acute diverticulitis. There is some mild wall thickening of short segment small bowel in the right lower quadrant, indeterminate. No free air. Vascular/Lymphatic: No significant vascular findings are present. No enlarged abdominal or pelvic lymph nodes. Reproductive: Uterus and bilateral adnexa are unremarkable. Other: There is a moderate-sized fat containing hernia, right of midline near the umbilicus containing a small amount of free fluid. There is a fat containing umbilical hernia as described above. No free air. Musculoskeletal: Severe degenerative changes affect the spine. IMPRESSION: 1. Umbilical hernia containing a loop of small bowel with marked wall thickening and surrounding inflammation. There is  resultant small bowel obstruction. Findings are worrisome for incarcerated/strangulated hernia. Surgical consultation recommended. 2. Additional ventral hernia containing fluid and fat, just  right of the umbilicus. 3. Sigmoid colon diverticulosis. 4. Hypodense lesion in the liver is too small to characterize in new from 2009. This can be further evaluated with ultrasound or MRI as clinically appropriate. Electronically Signed   By: Darliss Cheney M.D.   On: 09/25/2021 22:47    SIGNED: Kendell Bane, MD, FHM. [facility],  Pager (please use [email or web address] to page/text) Please use Epic Secure Chat for non-urgent communication (7AM-7PM)  If 7PM-7AM, please contact night-coverage [email or web address], 09/26/2021, 10:46 AM

## 2021-09-26 NOTE — Anesthesia Procedure Notes (Signed)
Procedure Name: Intubation Date/Time: 09/26/2021 3:05 PM Performed by: Vista Deck, CRNA Pre-anesthesia Checklist: Patient identified, Patient being monitored, Timeout performed, Emergency Drugs available and Suction available Patient Re-evaluated:Patient Re-evaluated prior to induction Oxygen Delivery Method: Circle system utilized Preoxygenation: Pre-oxygenation with 100% oxygen Induction Type: IV induction and Cricoid Pressure applied Laryngoscope Size: Mac and 3 Grade View: Grade I Tube type: Oral Tube size: 7.0 mm Number of attempts: 1 Airway Equipment and Method: Stylet and Oral airway Placement Confirmation: ETT inserted through vocal cords under direct vision, positive ETCO2 and breath sounds checked- equal and bilateral Secured at: 21 cm Tube secured with: Tape Dental Injury: Teeth and Oropharynx as per pre-operative assessment

## 2021-09-26 NOTE — Assessment & Plan Note (Signed)
Continue benazepril Blood pressure 126/64 at admission-well-controlled

## 2021-09-26 NOTE — Progress Notes (Signed)
  Transition of Care [facility]) Screening Note   Patient Details  Name: Kelly Savage Date of Birth: [birth year]   [facility] of [facility]) CM/SW Contact:    Villa Herb, LCSWA Phone Number: 09/26/2021, 11:23 AM    Transition of Care Department [facility]) has reviewed patient and no TOC needs have been identified at this time. We will continue to monitor patient advancement through interdisciplinary progression rounds. If new patient transition needs arise, please place a TOC consult.

## 2021-09-26 NOTE — Interval H&P Note (Signed)
History and Physical Interval Note:  09/26/2021 2:27 PM  Kelly Savage  has presented today for surgery, with the diagnosis of incisional hernia.  The various methods of treatment have been discussed with the patient and family. After consideration of risks, benefits and other options for treatment, the patient has consented to  Procedure(s): HERNIA REPAIR INCISIONAL (N/A) as a surgical intervention.  The patient's history has been reviewed, patient examined, no change in status, stable for surgery.  I have reviewed the patient's chart and labs.  Questions were answered to the patient's satisfaction.     Franky Macho

## 2021-09-26 NOTE — Assessment & Plan Note (Addendum)
-   Holding home oral hypoglycemics -Last hemoglobin A1c was 6.6 -Monitoring CBG every 4 hours, with SSI coverage

## 2021-09-26 NOTE — Progress Notes (Signed)
Gave patient Facilities manager.  Patient was able to do  X10 with good patient effort.

## 2021-09-26 NOTE — Progress Notes (Signed)
Pt returned to room 1750

## 2021-09-26 NOTE — Transfer of Care (Signed)
Immediate Anesthesia Transfer of Care Note  Patient: Kelly Savage  Procedure(s) Performed: HERNIORRHAPHY INCISIONAL (Abdomen) PARTIAL SMALL BOWEL RESECTION (Abdomen)  Patient Location: PACU  Anesthesia Type:General  Level of Consciousness: awake, alert , oriented and drowsy  Airway & Oxygen Therapy: Patient Spontanous Breathing and Patient connected to nasal cannula oxygen  Post-op Assessment: Report given to RN and Post -op Vital signs reviewed and stable  Post vital signs: Reviewed and stable  Last Vitals:  Vitals Value Taken Time  BP 116/64 09/26/21 1630  Temp 98.1 09/26/21 1627  Pulse 82 09/26/21 1630  Resp 13 09/26/21 1630  SpO2 98 % 09/26/21 1630  Vitals shown include unvalidated device data.  Last Pain:  Vitals:   09/26/21 1325  TempSrc:   PainSc: 0-No pain      Patients Stated Pain Goal: 0 (09/26/21 0730)  Complications: No notable events documented.

## 2021-09-26 NOTE — Consult Note (Signed)
Reason for Consult: Small bowel obstruction secondary to incisional hernia Referring Physician: Dr. Adela Lank is an 66 y.o. female.  HPI: Patient is a 66 year old white female who has had multiple abdominal surgeries in the past who presented to the emergency room with worsening nausea, vomiting, and abdominal pain.  She has a known periumbilical hernia but it became larger and firmer.  CT scan of the abdomen did reveal an incarcerated ventral hernia.  It was reducible in the emergency room but popped back out.  She was admitted to the hospital for further evaluation and treatment.  She denies any significant abdominal pain at the present time.  She has been n.p.o.  Past Medical History:  Diagnosis Date   Ankle pain x3 months   bilateral   Diabetes mellitus type II 2006   Diabetes mellitus without complication ([place])    Phreesia 07/11/2020   Obesity all her life    lost 28 lbs in 14 weeks    Primary localized osteoarthritis of knee    Left   Primary localized osteoarthritis of left knee 09/23/2017   Right thigh pain    Thigh pain x 1 year    bilateral, esp when laying down, worse in the morning , and goes away with activity    Past Surgical History:  Procedure Laterality Date   [place]     in childhood   TOTAL KNEE ARTHROPLASTY Left 10/18/2017   TOTAL KNEE ARTHROPLASTY Left 10/18/2017   Procedure: TOTAL KNEE ARTHROPLASTY;  Surgeon: Elsie Saas, MD;  Location: [facility];  Service: Orthopedics;  Laterality: Left;    Family History  Problem Relation Age of Onset   Heart failure Mother 29       CVA (secondary to afib    Hyperlipidemia Father    Hypertension Father    Cancer Maternal Grandfather        colon    Diabetes Maternal Grandmother     Social History:  reports that she has never smoked. She has never used smokeless tobacco. She reports that she does not currently use alcohol. She reports  that she does not use drugs.  Allergies: No Known Allergies  Medications: I have reviewed the patient's current medications. Prior to Admission:  Medications Prior to Admission  Medication Sig Dispense Refill Last Dose   aspirin EC 81 MG tablet Take 81 mg by mouth daily.      benazepril (LOTENSIN) 5 MG tablet Take 1 tablet (5 mg total) by mouth daily. 90 tablet 1    blood glucose meter kit and supplies Dispense based on patient and insurance preference. Three times daily testing dx e11.65 1 each 0    calcium carbonate (OSCAL) 1500 (600 Ca) MG TABS tablet Take 1,200 mg by mouth every evening.      COVID-19 mRNA Vac-TriS, Pfizer, (PFIZER-BIONT COVID-19 VAC-TRIS) SUSP injection Inject into the muscle. 0.3 mL 0    diclofenac (VOLTAREN) 75 MG EC tablet Take 1 tablet (75 mg total) by mouth daily as needed for knee pain 60 tablet 3    empagliflozin (JARDIANCE) 10 MG TABS tablet Take 1 tablet (10 mg total) by mouth daily before breakfast. 90 tablet 2    glipiZIDE (GLUCOTROL XL) 10 MG 24 hr tablet Take 2 tablets (20 mg total) by mouth in the morning at breakfast 180 tablet 1    glipiZIDE (GLUCOTROL) 5 MG tablet Take 1 tablet (5  mg total) by mouth daily 30 minutes before breakfast 30 tablet 1    glucose blood (FREESTYLE LITE) test strip Use as instructed to test blood sugar once daily. 100 each 11    Lancets (FREESTYLE) lancets Use as instructed to test blood sugar once daily. 100 each 2    metFORMIN (GLUCOPHAGE) 500 MG tablet Take 2 tablets (1,000 mg total) by mouth 2 (two) times daily for blood sugar. 180 tablet 1    ondansetron (ZOFRAN-ODT) 4 MG disintegrating tablet Allow1-2 tablets (4-8 mg total) to dissolve in the mouth every 8 (eight) hours as needed for nausea/vomiting 30 tablet 1    rosuvastatin (CRESTOR) 20 MG tablet Take 1 tablet (20 mg total) by mouth daily. 90 tablet 3    tirzepatide (MOUNJARO) 10 MG/0.5ML Pen Inject 10 mg into the skin once a week. (Patient not taking: Reported on  09/25/2021) 2 mL 6    tirzepatide (MOUNJARO) 7.5 MG/0.5ML Pen Inject 7.5 mg into the skin once a week. (Patient not taking: Reported on 09/25/2021) 2 mL 6     Results for orders placed or performed during the hospital encounter of 09/25/21 (from the past 48 hour(s))  Lipase, blood     Status: None   Collection Time: 09/25/21  4:24 PM  Result Value Ref Range   Lipase 23 11 - 51 U/L    Comment: Performed at [facility], [address], [place], [place] [address]  Comprehensive metabolic panel     Status: Abnormal   Collection Time: 09/25/21  4:24 PM  Result Value Ref Range   Sodium 132 (L) 135 - 145 mmol/L   Potassium 4.3 3.5 - 5.1 mmol/L   Chloride 95 (L) 98 - 111 mmol/L   CO2 20 (L) 22 - 32 mmol/L   Glucose, Bld 136 (H) 70 - 99 mg/dL    Comment: Glucose reference range applies only to samples taken after fasting for at least 8 hours.   BUN 21 8 - 23 mg/dL   Creatinine, Ser 0.76 0.44 - 1.00 mg/dL   Calcium 9.1 8.9 - 10.3 mg/dL   Total Protein 7.3 6.5 - 8.1 g/dL   Albumin 4.2 3.5 - 5.0 g/dL   AST 17 15 - 41 U/L   ALT 17 0 - 44 U/L   Alkaline Phosphatase 58 38 - 126 U/L   Total Bilirubin 2.5 (H) 0.3 - 1.2 mg/dL   GFR, Estimated >60 >60 mL/min    Comment: (NOTE) Calculated using the CKD-EPI Creatinine Equation (2021)    Anion gap 17 (H) 5 - 15    Comment: Performed at [facility], [address], [place], [place] [address]  CBC     Status: Abnormal   Collection Time: 09/25/21  4:24 PM  Result Value Ref Range   WBC 7.7 4.0 - 10.5 K/uL   RBC 4.94 3.87 - 5.11 MIL/uL   Hemoglobin 15.1 (H) 12.0 - 15.0 g/dL   HCT 45.1 36.0 - 46.0 %   MCV 91.3 80.0 - 100.0 fL   MCH 30.6 26.0 - 34.0 pg   MCHC 33.5 30.0 - 36.0 g/dL   RDW 13.2 11.5 - 15.5 %   Platelets 303 150 - 400 K/uL   nRBC 0.0 0.0 - 0.2 %    Comment: Performed at [facility], [address], [place], [place] [address]  Lactic acid, plasma     Status: None   Collection Time: 09/25/21  4:27 PM  Result Value Ref Range   Lactic  Acid, Venous  1.3 0.5 - 1.9 mmol/L    Comment: Performed at [facility], [address] [place], [place] [address]  Urinalysis, Routine w reflex microscopic Urine, Clean Catch     Status: Abnormal   Collection Time: 09/26/21 12:28 AM  Result Value Ref Range   Color, Urine YELLOW YELLOW   APPearance CLEAR CLEAR   Specific Gravity, Urine >1.046 (H) 1.005 - 1.030   pH 5.0 5.0 - 8.0   Glucose, UA >=500 (A) NEGATIVE mg/dL   Hgb urine dipstick SMALL (A) NEGATIVE   Bilirubin Urine NEGATIVE NEGATIVE   Ketones, ur 80 (A) NEGATIVE mg/dL   Protein, ur NEGATIVE NEGATIVE mg/dL   Nitrite NEGATIVE NEGATIVE   Leukocytes,Ua SMALL (A) NEGATIVE   RBC / HPF 0-5 0 - 5 RBC/hpf   WBC, UA 6-10 0 - 5 WBC/hpf   Bacteria, UA NONE SEEN NONE SEEN   Squamous Epithelial / LPF 0-5 0 - 5   Mucus PRESENT     Comment: Performed at [facility], [address] [place], [place] [address]  Lactic acid, plasma     Status: None   Collection Time: 09/26/21  3:43 AM  Result Value Ref Range   Lactic Acid, Venous 0.7 0.5 - 1.9 mmol/L    Comment: Performed at [facility], [address] [place], [place] [address]  Glucose, capillary     Status: Abnormal   Collection Time: 09/26/21  7:25 AM  Result Value Ref Range   Glucose-Capillary 106 (H) 70 - 99 mg/dL    Comment: Glucose reference range applies only to samples taken after fasting for at least 8 hours.  Magnesium     Status: None   Collection Time: 09/26/21  7:34 AM  Result Value Ref Range   Magnesium 1.9 1.7 - 2.4 mg/dL    Comment: Performed at [facility], [address] [place], [place] [address]    CT ABDOMEN PELVIS W CONTRAST  Result Date: 09/25/2021 CLINICAL DATA:  Bowel obstruction suspected.  Nausea and vomiting. EXAM: CT ABDOMEN AND PELVIS WITH CONTRAST TECHNIQUE: Multidetector CT imaging of the abdomen and pelvis was performed using the standard protocol following bolus administration of intravenous contrast. RADIATION DOSE REDUCTION: This exam was  performed according to the departmental dose-optimization program which includes automated exposure control, adjustment of the mA and/or kV according to patient size and/or use of iterative reconstruction technique. CONTRAST:  100mL OMNIPAQUE IOHEXOL 300 MG/ML  SOLN COMPARISON:  CT abdomen and pelvis 10/05/2007. FINDINGS: Lower chest: No acute abnormality. Hepatobiliary: Gallbladder surgically absent. No biliary ductal dilatation. There is a hypodensity in the right lobe of the liver which is too small to characterize measuring 8 mm image 2/31. This was not seen in 2009. Pancreas: Unremarkable. No pancreatic ductal dilatation or surrounding inflammatory changes. Spleen: Normal in size without focal abnormality. Adrenals/Urinary Tract: Adrenal glands are unremarkable. Kidneys are normal, without renal calculi, focal lesion, or hydronephrosis. Bladder is unremarkable. Stomach/Bowel: There is an umbilical hernia which is moderate in size containing a loop of small bowel with marked wall thickening and surrounding mesenteric edema/inflammation. This is transition point for small bowel obstruction. Small bowel loops proximal to this level are dilated with air-fluid levels measuring up to 4 cm. Stomach is nondilated. Colon is nondilated. Appendix not visualized. There is sigmoid colon diverticulosis without evidence for acute diverticulitis. There is some mild wall thickening of short segment small bowel in the right lower quadrant, indeterminate. No free air. Vascular/Lymphatic: No significant vascular findings are present. No enlarged abdominal   or pelvic lymph nodes. Reproductive: Uterus and bilateral adnexa are unremarkable. Other: There is a moderate-sized fat containing hernia, right of midline near the umbilicus containing a small amount of free fluid. There is a fat containing umbilical hernia as described above. No free air. Musculoskeletal: Severe degenerative changes affect the spine. IMPRESSION: 1. Umbilical  hernia containing a loop of small bowel with marked wall thickening and surrounding inflammation. There is resultant small bowel obstruction. Findings are worrisome for incarcerated/strangulated hernia. Surgical consultation recommended. 2. Additional ventral hernia containing fluid and fat, just right of the umbilicus. 3. Sigmoid colon diverticulosis. 4. Hypodense lesion in the liver is too small to characterize in new from 2009. This can be further evaluated with ultrasound or MRI as clinically appropriate. Electronically Signed   By: Ronney Asters M.D.   On: 09/25/2021 22:47    ROS:  Pertinent items are noted in HPI.  Blood pressure 116/68, pulse 83, temperature 98.2 F (36.8 C), temperature source Oral, resp. rate 18, height _0  (1.727 m), weight 79.9 kg, SpO2 99 %. Physical Exam: Pleasant well-developed well-nourished white female no acute distress Head is normocephalic, atraumatic Lungs clear to auscultation with equal breath sounds bilaterally Heart examination reveals regular rate and rhythm without S3, S4, murmurs Abdomen is soft with a large partially reducible periumbilical hernia.  Bowel is present.  Patient is nontender.  CT scan images personally reviewed  Assessment/Plan: Impression: Small bowel obstruction secondary to an incisional hernia. Plan: Patient will be taken to the operating room today for an incisional herniorrhaphy with mesh.  The risks and benefits of the procedure including bleeding, infection, mesh use, and the possibility of a bowel resection were fully explained to the patient, who gave informed consent.  Patient will be transferred to my service.  Aviva Signs 09/26/2021, 8:17 AM

## 2021-09-27 ENCOUNTER — Encounter: Payer: Self-pay | Admitting: Family Medicine

## 2021-09-27 DIAGNOSIS — R1112 Projectile vomiting: Secondary | ICD-10-CM | POA: Insufficient documentation

## 2021-09-27 DIAGNOSIS — K56609 Unspecified intestinal obstruction, unspecified as to partial versus complete obstruction: Secondary | ICD-10-CM | POA: Diagnosis not present

## 2021-09-27 LAB — CBC
HCT: 35.9 % — ABNORMAL LOW (ref 36.0–46.0)
Hemoglobin: 12.3 g/dL (ref 12.0–15.0)
MCH: 31 pg (ref 26.0–34.0)
MCHC: 34.3 g/dL (ref 30.0–36.0)
MCV: 90.4 fL (ref 80.0–100.0)
Platelets: 213 K/uL (ref 150–400)
RBC: 3.97 MIL/uL (ref 3.87–5.11)
RDW: 13.3 % (ref 11.5–15.5)
WBC: 6.7 K/uL (ref 4.0–10.5)
nRBC: 0 % (ref 0.0–0.2)

## 2021-09-27 LAB — GLUCOSE, CAPILLARY
Glucose-Capillary: 186 mg/dL — ABNORMAL HIGH (ref 70–99)
Glucose-Capillary: 155 mg/dL — ABNORMAL HIGH (ref 70–99)
Glucose-Capillary: 189 mg/dL — ABNORMAL HIGH (ref 70–99)
Glucose-Capillary: 202 mg/dL — ABNORMAL HIGH (ref 70–99)

## 2021-09-27 LAB — BASIC METABOLIC PANEL
Anion gap: 10 (ref 5–15)
BUN: 12 mg/dL (ref 8–23)
CO2: 20 mmol/L — ABNORMAL LOW (ref 22–32)
Calcium: 7.8 mg/dL — ABNORMAL LOW (ref 8.9–10.3)
Chloride: 104 mmol/L (ref 98–111)
Creatinine, Ser: 0.59 mg/dL (ref 0.44–1.00)
GFR, Estimated: >60 mL/min (ref >60)
Glucose, Bld: 170 mg/dL — ABNORMAL HIGH (ref 70–99)
Potassium: 3.3 mmol/L — ABNORMAL LOW (ref 3.5–5.1)
Sodium: 134 mmol/L — ABNORMAL LOW (ref 135–145)

## 2021-09-27 LAB — HEMOGLOBIN A1C
Hgb A1c MFr Bld: 6.4 % — ABNORMAL HIGH (ref 4.8–5.6)
Mean Plasma Glucose: 136.98 mg/dL

## 2021-09-27 LAB — MAGNESIUM: Magnesium: 1.4 mg/dL — ABNORMAL LOW (ref 1.7–2.4)

## 2021-09-27 LAB — PHOSPHORUS: Phosphorus: 2.7 mg/dL (ref 2.5–4.6)

## 2021-09-27 MED ORDER — CHLORHEXIDINE GLUCONATE CLOTH 2 % EX PADS
6.0000 | MEDICATED_PAD | Freq: Every day | CUTANEOUS | Status: DC
  Administered 2021-09-27 – 2021-10-01 (×5): 6 via TOPICAL

## 2021-09-27 MED ORDER — MAGNESIUM SULFATE 2 GM/50ML IV SOLN
2.0000 g | Freq: Once | INTRAVENOUS | Status: AC
  Administered 2021-09-27: 2 g via INTRAVENOUS
  Filled 2021-09-27: qty 50

## 2021-09-27 MED ORDER — KCL IN DEXTROSE-NACL 40-5-0.45 MEQ/L-%-% IV SOLN: INTRAVENOUS | Status: DC

## 2021-09-27 NOTE — Progress Notes (Signed)
Pts foley was removed today at 0800. Pt has voided twice since removal with no complaints. Pt ambulated to and from the bathroom with stand by assist. Pt has complained of minimal pain this shift and has tolerated a full liquid diet without and N/V.

## 2021-09-27 NOTE — Assessment & Plan Note (Signed)
5 day h/o worsening symptoms, zofran 4 mg Im, needs urgent ct abd and pelvis, suspect obstruction, unable to get sat,sent to ED and I spoke with MD

## 2021-09-27 NOTE — Assessment & Plan Note (Signed)
09/26/2021 S/P HERNIORRHAPHY INCISIONAL PARTIAL SMALL BOWEL  RESECTION -Tolerated procedure well, stable,

## 2021-09-27 NOTE — Assessment & Plan Note (Signed)
Controlled, no change in medication  

## 2021-09-27 NOTE — Progress Notes (Signed)
PROGRESS NOTE    Patient: Kelly Savage                            PCP: Kerri PerchesSimpson, Margaret E, MD                    DOB: 12/16/1955            DOA: 09/25/2021 [id number]             DOS: 09/27/2021, 11:34 AM   LOS: 1 day   Date of Service: The patient was seen and examined on 09/27/2021  Subjective:   The patient was seen and examined this morning. Stable at this time. Still complaining of : Generalized abdominal discomfort, tolerable with analgesics, improved nausea vomiting.  Otherwise no issues overnight .  Brief Narrative:    Kelly SpareCheryl D Savage is a 66 y.o. female with medical history significant of with history of diabetes mellitus type 2, hypertension, and more presents to the ED with a chief complaint of nausea vomiting and abdominal pain.   Patient states this has been going on for 3 weeks but has been especially worse over the last 4 days.  The pain is diffuse across her abdomen, intermittent, and feels like burning or cramping.  She describes the pain is severe, lasting seconds-minutes.  The pain is more frequent and worse at night.  Eating makes the pain worse.  Patient had 1 episode of explosive watery bowel movement.  That was 6 days ago.  No bowel movement since then.  Patient has been throwing up.  That is also worse at night and worse with eating.    She denies any hematemesis.  Patient denies hematochezia and melena as well. Patient's last colonoscopy was several years ago.  Her last Cologuard was last year.  Patient does not smoke, does not drink, does not use illicit drugs.   She is vaccinated for COVID.  Patient is full code.    Assessment & Plan:   Principal Problem:   Small bowel obstruction (HCC) Active Problems:   Type 2 diabetes mellitus with vascular disease (HCC)   Hyperlipidemia with target LDL less than 100   Essential hypertension   Hernia, ventral incisional, with obstruction     Assessment and Plan: * Small bowel obstruction (HCC) -Postop day #  1 -09/26/2021 status post partial small bowel resection, right hernia repair repeat drain in place  - CT abdomen pelvis shows umbilical hernia containing a loop of small bowel with marked wall thickening and surrounding inflammation-resultant small bowel obstruction.  Findings worrisome for incarcerated/strangulated hernia -Dr. Lovell SheehanJenkins following  -Pain control with pain scale -Nausea control with Zofran and Compazine   Hernia, ventral incisional, with obstruction 09/26/2021 S/P HERNIORRHAPHY INCISIONAL PARTIAL SMALL BOWEL  RESECTION -Tolerated procedure well, stable,  Essential hypertension Continue benazepril -Blood pressure remained stable  Hyperlipidemia with target LDL less than 100 Continue Crestor  Type 2 diabetes mellitus with vascular disease (HCC) - Holding home oral hypoglycemics -Last hemoglobin A1c was 6.6 -Monitoring CBG every 4 hours, with SSI coverage    ---------------------------------------------------------------------------------------------------------------------  DVT prophylaxis:  enoxaparin (LOVENOX) injection 40 mg Start: 09/27/21 0800 SCD's Start: 09/26/21 1812   Code Status:   Code Status: Full Code  Family Communication:  The patient no family present The above findings and plan of care has been discussed with patient in detail,  they expressed understanding and agreement of above. -Advance care planning has been discussed.  Admission status:   Status is: Inpatient Remains inpatient appropriate because: Needing IV fluid, n.p.o., needing surgery evaluation possible surgical intervention     Procedures:   No admission procedures for hospital encounter.   Antimicrobials:  Anti-infectives (From admission, onward)    Start     Dose/Rate Route Frequency Ordered Stop   09/26/21 1200  ceFAZolin (ANCEF) IVPB 2g/100 mL premix        2 g 200 mL/hr over 30 Minutes Intravenous On call to O.R. 09/26/21 1105 09/26/21 1520         Medication:   Chlorhexidine Gluconate Cloth  6 each Topical Daily   enoxaparin (LOVENOX) injection  40 mg Subcutaneous Q24H   insulin aspart  0-15 Units Subcutaneous TID WC   pantoprazole  40 mg Oral Q1200    acetaminophen **OR** acetaminophen, alum & mag hydroxide-simeth, diphenhydrAMINE **OR** diphenhydrAMINE, HYDROmorphone (DILAUDID) injection, [COMPLETED] ketorolac **FOLLOWED BY** ketorolac, LORazepam, ondansetron **OR** ondansetron (ZOFRAN) IV, oxyCODONE, simethicone   Objective:   Vitals:   09/26/21 1730 09/26/21 1756 09/27/21 0553 09/27/21 0827  BP: (!) 108/59 106/61 (!) 99/57 112/60  Pulse: 83 80 89 90  Resp: 15 18 18    Temp:  97.8 F (36.6 C) (!) 97.4 F (36.3 C)   TempSrc:  Oral Oral   SpO2: 96% 96% 96%   Weight:      Height:        Intake/Output Summary (Last 24 hours) at 09/27/2021 1134 Last data filed at 09/27/2021 0900 Gross per 24 hour  Intake 4596.97 ml  Output 1560 ml  Net 3036.97 ml   Filed Weights   09/25/21 1547  Weight: 79.9 kg     Examination:     Physical Exam:   General:  AAO x 3,  cooperative, no distress;   HEENT:  Normocephalic, PERRL, otherwise with in Normal limits   Neuro:  CNII-XII intact. , normal motor and sensation, reflexes intact   Lungs:   Clear to auscultation BL, Respirations unlabored,  No wheezes / crackles  Cardio:    S1/S2, RRR, No murmure, No Rubs or Gallops   Abdomen:  Soft, non-tender, hypoactive bowel sounds, no guarding or peritoneal signs.  Mild diffuse tenderness, surgical wound, dressing in place  Muscular  skeletal:  Limited exam -global generalized weaknesses - in bed, able to move all 4 extremities,   2+ pulses,  symmetric, No pitting edema  Skin:  Dry, warm to touch, negative for any Rashes, post abdominal wound  Wounds: Postop abdominal wound-Singh in place, JP in place       ------------------------------------------------------------------------------------------------------------------------------------------    LABs:     Latest Ref Rng & Units 09/27/2021    6:15 AM 09/25/2021    4:24 PM 09/23/2021    2:39 AM  CBC  WBC 4.0 - 10.5 K/uL 6.7   7.7   11.6    Hemoglobin 12.0 - 15.0 g/dL 09/25/2021   17.5   10.2    Hematocrit 36.0 - 46.0 % 35.9   45.1   42.2    Platelets 150 - 400 K/uL 213   303   243        Latest Ref Rng & Units 09/27/2021    6:15 AM 09/25/2021    4:24 PM 09/23/2021    2:39 AM  CMP  Glucose 70 - 99 mg/dL 09/25/2021   277   824    BUN 8 - 23 mg/dL 12   21   16     Creatinine 0.44 - 1.00 mg/dL 235  0.76   0.69    Sodium 135 - 145 mmol/L 134   132   136    Potassium 3.5 - 5.1 mmol/L 3.3   4.3   3.5    Chloride 98 - 111 mmol/L 104   95   99    CO2 22 - 32 mmol/L 20   20   24     Calcium 8.9 - 10.3 mg/dL 7.8   9.1   9.4    Total Protein 6.5 - 8.1 g/dL  7.3   6.9    Total Bilirubin 0.3 - 1.2 mg/dL  2.5   2.9    Alkaline Phos 38 - 126 U/L  58   54    AST 15 - 41 U/L  17   18    ALT 0 - 44 U/L  17   14         Micro Results Recent Results (from the past 240 hour(s))  Surgical pcr screen     Status: None   Collection Time: 09/26/21  9:48 AM   Specimen: Nasal Mucosa; Nasal Swab  Result Value Ref Range Status   MRSA, PCR NEGATIVE NEGATIVE Final   Staphylococcus aureus NEGATIVE NEGATIVE Final    Comment: (NOTE) The Xpert SA Assay (FDA approved for NASAL specimens in patients 37 years of age and older), is one component of a comprehensive surveillance program. It is not intended to diagnose infection nor to guide or monitor treatment. Performed at [facility], [address], [place], [place]     Radiology Reports No results found.  SIGNED: [id number], MD, FHM. [facility],  Pager (please use [email or web address] to page/text) Please use Epic Secure Chat for non-urgent communication (7AM-7PM)  If 7PM-7AM, please contact  night-coverage [email or web address], 09/27/2021, 11:34 AM

## 2021-09-27 NOTE — Progress Notes (Signed)
1 Day Post-Op  Subjective: Patient has minimal incisional pain.  Is passing gas.  No bowel movement yet.  Objective: Vital signs in last 24 hours: Temp:  [97.4 F (36.3 C)-98.5 F (36.9 C)] 97.4 F (36.3 C) (05/27 0553) Pulse Rate:  [77-90] 90 (05/27 0827) Resp:  [13-18] 18 (05/27 0553) BP: (99-116)/(57-64) 112/60 (05/27 0827) SpO2:  [93 %-98 %] 96 % (05/27 0553) Last BM Date : 09/20/21 (explosivice Diarrhea)  Intake/Output from previous day: 05/26 0701 - 05/27 0700 In: 4117 [P.O.:240; I.V.:3777; IV Piggyback:100] Out: 1560 [Urine:1450; Drains:35; Blood:50] Intake/Output this shift: No intake/output data recorded.  General appearance: alert, cooperative, and no distress Resp: clear to auscultation bilaterally Cardio: regular rate and rhythm, S1, S2 normal, no murmur, click, rub or gallop GI: Soft, incision healing well.  JP drainage serosanguineous in nature.  Lab Results:    BMET Recent Labs    09/25/21 1624 09/27/21 0615  NA 132* 134*  K 4.3 3.3*  CL 95* 104  CO2 20* 20*  GLUCOSE 136* 170*  BUN 21 12  CREATININE 0.76 0.59  CALCIUM 9.1 7.8*   PT/INR No results for input(s): LABPROT, INR in the last 72 hours.  Studies/Results: DG Abd 1 View  Result Date: 09/26/2021 CLINICAL DATA:  Abdominal pain EXAM: ABDOMEN - 1 VIEW COMPARISON:  CT abdomen and pelvis 09/25/2021 FINDINGS: Abnormally distended gas-filled loops of small bowel visualized in the mid abdomen measuring up to 4.6 cm in diameter. Retained fecal material in the right colon. Cholecystectomy clips. Excreted contrast in the urinary bladder. No nephrolithiasis visualized. IMPRESSION: Abnormal bowel-gas pattern suggesting obstruction as seen on CT. Continued follow-up recommended. Electronically Signed   By: Jannifer Hick M.D.   On: 09/26/2021 08:42   CT ABDOMEN PELVIS W CONTRAST  Result Date: 09/25/2021 CLINICAL DATA:  Bowel obstruction suspected.  Nausea and vomiting. EXAM: CT ABDOMEN AND PELVIS WITH  CONTRAST TECHNIQUE: Multidetector CT imaging of the abdomen and pelvis was performed using the standard protocol following bolus administration of intravenous contrast. RADIATION DOSE REDUCTION: This exam was performed according to the departmental dose-optimization program which includes automated exposure control, adjustment of the mA and/or kV according to patient size and/or use of iterative reconstruction technique. CONTRAST:  OMNIPAQUE IOHEXOL 300 MG/ML  SOLN COMPARISON:  CT abdomen and pelvis 10/05/2007. FINDINGS: Lower chest: No acute abnormality. Hepatobiliary: Gallbladder surgically absent. No biliary ductal dilatation. There is a hypodensity in the right lobe of the liver which is too small to characterize measuring 8 mm image 2/31. This was not seen in 2009. Pancreas: Unremarkable. No pancreatic ductal dilatation or surrounding inflammatory changes. Spleen: Normal in size without focal abnormality. Adrenals/Urinary Tract: Adrenal glands are unremarkable. Kidneys are normal, without renal calculi, focal lesion, or hydronephrosis. Bladder is unremarkable. Stomach/Bowel: There is an umbilical hernia which is moderate in size containing a loop of small bowel with marked wall thickening and surrounding mesenteric edema/inflammation. This is transition point for small bowel obstruction. Small bowel loops proximal to this level are dilated with air-fluid levels measuring up to 4 cm. Stomach is nondilated. Colon is nondilated. Appendix not visualized. There is sigmoid colon diverticulosis without evidence for acute diverticulitis. There is some mild wall thickening of short segment small bowel in the right lower quadrant, indeterminate. No free air. Vascular/Lymphatic: No significant vascular findings are present. No enlarged abdominal or pelvic lymph nodes. Reproductive: Uterus and bilateral adnexa are unremarkable. Other: There is a moderate-sized fat containing hernia, right of midline near the  umbilicus containing  a small amount of free fluid. There is a fat containing umbilical hernia as described above. No free air. Musculoskeletal: Severe degenerative changes affect the spine. IMPRESSION: 1. Umbilical hernia containing a loop of small bowel with marked wall thickening and surrounding inflammation. There is resultant small bowel obstruction. Findings are worrisome for incarcerated/strangulated hernia. Surgical consultation recommended. 2. Additional ventral hernia containing fluid and fat, just right of the umbilicus. 3. Sigmoid colon diverticulosis. 4. Hypodense lesion in the liver is too small to characterize in new from 2009. This can be further evaluated with ultrasound or MRI as clinically appropriate. Electronically Signed   By: Darliss Cheney M.D.   On: 09/25/2021 22:47    Anti-infectives: Anti-infectives (From admission, onward)    Start     Dose/Rate Route Frequency Ordered Stop   09/26/21 1200  ceFAZolin (ANCEF) IVPB 2g/100 mL premix        2 g 200 mL/hr over 30 Minutes Intravenous On call to O.R. 09/26/21 1105 09/26/21 1520       Assessment/Plan: s/p Procedure(s): HERNIORRHAPHY INCISIONAL PARTIAL SMALL BOWEL RESECTION Impression: Stable on postoperative day 1.  Awaiting return of bowel function.  Hypokalemia and hypomagnesemia will be addressed.  LOS: 1 day    Franky Macho 09/27/2021

## 2021-09-28 DIAGNOSIS — K56609 Unspecified intestinal obstruction, unspecified as to partial versus complete obstruction: Secondary | ICD-10-CM | POA: Diagnosis not present

## 2021-09-28 LAB — BASIC METABOLIC PANEL
Anion gap: 3 — ABNORMAL LOW (ref 5–15)
BUN: 7 mg/dL — ABNORMAL LOW (ref 8–23)
CO2: 26 mmol/L (ref 22–32)
Calcium: 7.9 mg/dL — ABNORMAL LOW (ref 8.9–10.3)
Chloride: 106 mmol/L (ref 98–111)
Creatinine, Ser: 0.41 mg/dL — ABNORMAL LOW (ref 0.44–1.00)
GFR, Estimated: >60 mL/min (ref >60)
Glucose, Bld: 190 mg/dL — ABNORMAL HIGH (ref 70–99)
Potassium: 3.8 mmol/L (ref 3.5–5.1)
Sodium: 135 mmol/L (ref 135–145)

## 2021-09-28 LAB — GLUCOSE, CAPILLARY
Glucose-Capillary: 200 mg/dL — ABNORMAL HIGH (ref 70–99)
Glucose-Capillary: 163 mg/dL — ABNORMAL HIGH (ref 70–99)
Glucose-Capillary: 178 mg/dL — ABNORMAL HIGH (ref 70–99)
Glucose-Capillary: 223 mg/dL — ABNORMAL HIGH (ref 70–99)

## 2021-09-28 LAB — MAGNESIUM: Magnesium: 1.9 mg/dL (ref 1.7–2.4)

## 2021-09-28 LAB — CBC
HCT: 34.4 % — ABNORMAL LOW (ref 36.0–46.0)
Hemoglobin: 11.8 g/dL — ABNORMAL LOW (ref 12.0–15.0)
MCH: 30.6 pg (ref 26.0–34.0)
MCHC: 34.3 g/dL (ref 30.0–36.0)
MCV: 89.1 fL (ref 80.0–100.0)
Platelets: 183 10*3/uL (ref 150–400)
RBC: 3.86 MIL/uL — ABNORMAL LOW (ref 3.87–5.11)
RDW: 13.5 % (ref 11.5–15.5)
WBC: 7.9 10*3/uL (ref 4.0–10.5)
nRBC: 0 % (ref 0.0–0.2)

## 2021-09-28 LAB — PHOSPHORUS: Phosphorus: <1 mg/dL — CL (ref 2.5–4.6)

## 2021-09-28 MED ORDER — DEXTROSE 5 % IV SOLN
30.0000 mmol | Freq: Once | INTRAVENOUS | Status: AC
  Administered 2021-09-28: 30 mmol via INTRAVENOUS
  Filled 2021-09-28: qty 10

## 2021-09-28 NOTE — Progress Notes (Signed)
2 Days Post-Op  Subjective: Is passing flatus.  Is hungry.  Denies any nausea.  No significant incisional pain.  Objective: Vital signs in last 24 hours: Temp:  [98.5 F (36.9 C)-98.9 F (37.2 C)] 98.5 F (36.9 C) (05/28 0552) Pulse Rate:  [89-99] 89 (05/28 0552) Resp:  [17] 17 (05/27 1934) BP: (102-112)/(59-65) 102/65 (05/28 0552) SpO2:  [94 %-97 %] 94 % (05/28 0552) Last BM Date : 09/20/21 (explosivice Diarrhea)  Intake/Output from previous day: 05/27 0701 - 05/28 0700 In: 3343.3 [P.O.:1680; I.V.:1663.3] Out: -  Intake/Output this shift: No intake/output data recorded.  General appearance: alert, cooperative, and no distress Resp: clear to auscultation bilaterally Cardio: regular rate and rhythm, S1, S2 normal, no murmur, click, rub or gallop GI: Soft, occasional bowel sounds appreciated.  Incision healing well.  JP drainage serosanguineous in nature.  Lab Results:  Recent Labs    09/27/21 0615 09/28/21 0426  WBC 6.7 7.9  HGB 12.3 11.8*  HCT 35.9* 34.4*  PLT 213 183   BMET Recent Labs    09/27/21 0615 09/28/21 0426  NA 134* 135  K 3.3* 3.8  CL 104 106  CO2 20* 26  GLUCOSE 170* 190*  BUN 12 7*  CREATININE 0.59 0.41*  CALCIUM 7.8* 7.9*   PT/INR No results for input(s): LABPROT, INR in the last 72 hours.  Studies/Results: No results found.  Anti-infectives: Anti-infectives (From admission, onward)    Start     Dose/Rate Route Frequency Ordered Stop   09/26/21 1200  ceFAZolin (ANCEF) IVPB 2g/100 mL premix        2 g 200 mL/hr over 30 Minutes Intravenous On call to O.R. 09/26/21 1105 09/26/21 1520       Assessment/Plan: s/p Procedure(s): HERNIORRHAPHY INCISIONAL PARTIAL SMALL BOWEL RESECTION Impression: Bowel function starting to return on postoperative day 2.  Hypophosphatemia noted and being addressed.  Hypomagnesemia resolved.  Will advance to carb modified diet.  LOS: 2 days    Franky Macho 09/28/2021

## 2021-09-28 NOTE — Progress Notes (Signed)
PROGRESS NOTE    Patient: Kelly Savage                            PCP: Kerri Perches, MD                    DOB: 03-20-1956            DOA: 09/25/2021 WYS:[id number]             DOS: 09/28/2021, 12:33 PM   LOS: 2 days   Date of Service: The patient was seen and examined on 09/28/2021  Subjective:   Postop day #2 Patient was seen and examined, medically stable, reporting of gas and no bowel movement yet No issues overnight  Brief Narrative:    Kelly Savage is a 66 y.o. female with medical history significant of with history of diabetes mellitus type 2, hypertension, and more presents to the ED with a chief complaint of nausea vomiting and abdominal pain.   Patient states this has been going on for 3 weeks but has been especially worse over the last 4 days.  The pain is diffuse across her abdomen, intermittent, and feels like burning or cramping.  She describes the pain is severe, lasting seconds-minutes.  The pain is more frequent and worse at night.  Eating makes the pain worse.  Patient had 1 episode of explosive watery bowel movement.  That was 6 days ago.  No bowel movement since then.  Patient has been throwing up.  That is also worse at night and worse with eating.    She denies any hematemesis.  Patient denies hematochezia and melena as well. Patient's last colonoscopy was several years ago.  Her last Cologuard was last year.  Patient does not smoke, does not drink, does not use illicit drugs.   She is vaccinated for COVID.  Patient is full code.    Assessment & Plan:   Principal Problem:   Small bowel obstruction (HCC) Active Problems:   Type 2 diabetes mellitus with vascular disease (HCC)   Hyperlipidemia with target LDL less than 100   Essential hypertension   Hernia, ventral incisional, with obstruction     Assessment and Plan: * Small bowel obstruction (HCC) -Postop day # 2 -09/26/2021 status post partial small bowel resection, right hernia repair repeat  drain in place  -Reporting of gas but no bowel movement yet, tolerating clear liquid diet, advancing as tolerated   - CT abdomen pelvis shows umbilical hernia containing a loop of small bowel with marked wall thickening and surrounding inflammation-resultant small bowel obstruction.  Findings worrisome for incarcerated/strangulated hernia -Dr. Lovell Sheehan following  -Pain control with pain scale -Nausea control with Zofran and Compazine    Hernia, ventral incisional, with obstruction 09/26/2021 S/P HERNIORRHAPHY INCISIONAL PARTIAL SMALL BOWEL  RESECTION -Tolerated procedure well, stable,  Essential hypertension Continue benazepril -Blood pressure remained stable  Hyperlipidemia with target LDL less than 100 Continue Crestor  Type 2 diabetes mellitus with vascular disease (HCC) - Holding home oral hypoglycemics -Last hemoglobin A1c was 6.6 -Monitoring CBG every 4 hours, with SSI coverage    ---------------------------------------------------------------------------------------------------------------------  DVT prophylaxis:  enoxaparin (LOVENOX) injection 40 mg Start: 09/27/21 0800 SCD's Start: 09/26/21 1812   Code Status:   Code Status: Full Code  Family Communication:  The patient no family present The above findings and plan of care has been discussed with patient in detail,  they expressed understanding and agreement  of above. -Advance care planning has been discussed.   Admission status:   Status is: Inpatient Remains inpatient appropriate because: Needing IV fluid, n.p.o., pending bowel function.. Likely discharge in 1 to 2 days    Procedures:   No admission procedures for hospital encounter.   Antimicrobials:  Anti-infectives (From admission, onward)    Start     Dose/Rate Route Frequency Ordered Stop   09/26/21 1200  ceFAZolin (ANCEF) IVPB 2g/100 mL premix        2 g 200 mL/hr over 30 Minutes Intravenous On call to O.R. 09/26/21 1105 09/26/21 1520         Medication:   Chlorhexidine Gluconate Cloth  6 each Topical Daily   enoxaparin (LOVENOX) injection  40 mg Subcutaneous Q24H   insulin aspart  0-15 Units Subcutaneous TID WC   pantoprazole  40 mg Oral Q1200    acetaminophen **OR** acetaminophen, alum & mag hydroxide-simeth, diphenhydrAMINE **OR** diphenhydrAMINE, HYDROmorphone (DILAUDID) injection, [COMPLETED] ketorolac **FOLLOWED BY** ketorolac, LORazepam, ondansetron **OR** ondansetron (ZOFRAN) IV, oxyCODONE, simethicone   Objective:   Vitals:   09/27/21 0827 09/27/21 1149 09/27/21 1934 09/28/21 0552  BP: 112/60 105/65 (!) 108/59 102/65  Pulse: 90 90 99 89  Resp:   17   Temp:   98.9 F (37.2 C) 98.5 F (36.9 C)  TempSrc:   Oral Oral  SpO2:   97% 94%  Weight:      Height:        Intake/Output Summary (Last 24 hours) at 09/28/2021 1233 Last data filed at 09/28/2021 0500 Gross per 24 hour  Intake 2863.33 ml  Output --  Net 2863.33 ml   Filed Weights   09/25/21 1547  Weight: 79.9 kg     Examination:      General:  AAO x 3,  cooperative, no distress;   HEENT:  Normocephalic, PERRL, otherwise with in Normal limits   Neuro:  CNII-XII intact. , normal motor and sensation, reflexes intact   Lungs:   Clear to auscultation BL, Respirations unlabored,  No wheezes / crackles  Cardio:    S1/S2, RRR, No murmure, No Rubs or Gallops   Abdomen:  Soft, mild diffuse tenderness, bowel sounds active all four quadrants, no guarding or peritoneal signs.  Muscular  skeletal:  Limited exam -global generalized weaknesses - in bed, able to move all 4 extremities,   2+ pulses,  symmetric, No pitting edema  Skin:  Postsurgical abdominal wound, dressing in place, JP drain in place, draining minimal serosanguineous fluid  Otherwise skin is dry, warm to touch, negative for any Rashes,  Wounds: Please see nursing documentation   Postop abdominal wound-Singh in place, JP in place         LABs:     Latest Ref Rng & Units  09/28/2021    4:26 AM 09/27/2021    6:15 AM 09/25/2021    4:24 PM  CBC  WBC 4.0 - 10.5 K/uL 7.9   6.7   7.7    Hemoglobin 12.0 - 15.0 g/dL 13.211.8   44.012.3   10.215.1    Hematocrit 36.0 - 46.0 % 34.4   35.9   45.1    Platelets 150 - 400 K/uL 183   213   303        Latest Ref Rng & Units 09/28/2021    4:26 AM 09/27/2021    6:15 AM 09/25/2021    4:24 PM  CMP  Glucose 70 - 99 mg/dL 725190   366170   [id number]  BUN 8 - 23 mg/dL 7   12   21     Creatinine 0.44 - 1.00 mg/dL   3.29   9.24    Sodium 135 - 145 mmol/L 135   134   132    Potassium 3.5 - 5.1 mmol/L 3.8   3.3   4.3    Chloride 98 - 111 mmol/L 106   104   95    CO2 22 - 32 mmol/L 26   20   20     Calcium 8.9 - 10.3 mg/dL 7.9   7.8   9.1    Total Protein 6.5 - 8.1 g/dL   7.3    Total Bilirubin 0.3 - 1.2 mg/dL   2.5    Alkaline Phos 38 - 126 U/L   58    AST 15 - 41 U/L   17    ALT 0 - 44 U/L   17         Micro Results Recent Results (from the past 240 hour(s))  Surgical pcr screen     Status: None   Collection Time: 09/26/21  9:48 AM   Specimen: Nasal Mucosa; Nasal Swab  Result Value Ref Range Status   MRSA, PCR NEGATIVE NEGATIVE Final   Staphylococcus aureus NEGATIVE NEGATIVE Final    Comment: (NOTE) The Xpert SA Assay (FDA approved for NASAL specimens in patients 56 years of age and older), is one component of a comprehensive surveillance program. It is not intended to diagnose infection nor to guide or monitor treatment. Performed at [facility], [address] [place], [address] [place]     Radiology Reports No results found.  SIGNED: [place], MD, FHM. [facility],  Pager (please use [email or web address] to page/text) Please use Epic Secure Chat for non-urgent communication (7AM-7PM)  If 7PM-7AM, please contact night-coverage [email or web address], 09/28/2021, 12:33 PM

## 2021-09-29 DIAGNOSIS — E876 Hypokalemia: Secondary | ICD-10-CM | POA: Diagnosis not present

## 2021-09-29 DIAGNOSIS — K56609 Unspecified intestinal obstruction, unspecified as to partial versus complete obstruction: Secondary | ICD-10-CM | POA: Diagnosis not present

## 2021-09-29 LAB — CBC
HCT: 32.5 % — ABNORMAL LOW (ref 36.0–46.0)
Hemoglobin: 11 g/dL — ABNORMAL LOW (ref 12.0–15.0)
MCH: 30.5 pg (ref 26.0–34.0)
MCHC: 33.8 g/dL (ref 30.0–36.0)
MCV: 90 fL (ref 80.0–100.0)
Platelets: 178 10*3/uL (ref 150–400)
RBC: 3.61 MIL/uL — ABNORMAL LOW (ref 3.87–5.11)
RDW: 13.5 % (ref 11.5–15.5)
WBC: 8 10*3/uL (ref 4.0–10.5)
nRBC: 0 % (ref 0.0–0.2)

## 2021-09-29 LAB — BASIC METABOLIC PANEL
Anion gap: 1 — ABNORMAL LOW (ref 5–15)
BUN: 5 mg/dL — ABNORMAL LOW (ref 8–23)
CO2: 29 mmol/L (ref 22–32)
Calcium: 7.6 mg/dL — ABNORMAL LOW (ref 8.9–10.3)
Chloride: 104 mmol/L (ref 98–111)
Creatinine, Ser: 0.44 mg/dL (ref 0.44–1.00)
GFR, Estimated: >60 mL/min (ref >60)
Glucose, Bld: 169 mg/dL — ABNORMAL HIGH (ref 70–99)
Potassium: 3.3 mmol/L — ABNORMAL LOW (ref 3.5–5.1)
Sodium: 134 mmol/L — ABNORMAL LOW (ref 135–145)

## 2021-09-29 LAB — MAGNESIUM: Magnesium: 1.5 mg/dL — ABNORMAL LOW (ref 1.7–2.4)

## 2021-09-29 LAB — PHOSPHORUS: Phosphorus: 1.6 mg/dL — ABNORMAL LOW (ref 2.5–4.6)

## 2021-09-29 LAB — GLUCOSE, CAPILLARY
Glucose-Capillary: 166 mg/dL — ABNORMAL HIGH (ref 70–99)
Glucose-Capillary: 196 mg/dL — ABNORMAL HIGH (ref 70–99)
Glucose-Capillary: 135 mg/dL — ABNORMAL HIGH (ref 70–99)
Glucose-Capillary: 158 mg/dL — ABNORMAL HIGH (ref 70–99)

## 2021-09-29 MED ORDER — POTASSIUM CHLORIDE CRYS ER 20 MEQ PO TBCR
20.0000 meq | EXTENDED_RELEASE_TABLET | Freq: Once | ORAL | Status: AC
  Administered 2021-09-29: 20 meq via ORAL
  Filled 2021-09-29: qty 1

## 2021-09-29 MED ORDER — POTASSIUM CHLORIDE IN NACL 20-0.9 MEQ/L-% IV SOLN
INTRAVENOUS | Status: DC
Start: 2021-09-29 — End: 2021-10-01

## 2021-09-29 MED ORDER — ASPIRIN 81 MG PO TBEC
81.0000 mg | DELAYED_RELEASE_TABLET | Freq: Every day | ORAL | 11 refills | Status: DC
  Filled 2021-09-29: qty 30, 30d supply, fill #0

## 2021-09-29 MED ORDER — POTASSIUM PHOSPHATES 15 MMOLE/5ML IV SOLN
30.0000 mmol | Freq: Once | INTRAVENOUS | Status: AC
  Administered 2021-09-29: 30 mmol via INTRAVENOUS
  Filled 2021-09-29: qty 10

## 2021-09-29 MED ORDER — POLYETHYLENE GLYCOL 3350 17 G PO PACK
17.0000 g | PACK | Freq: Two times a day (BID) | ORAL | Status: DC
  Administered 2021-09-29 – 2021-10-01 (×5): 17 g via ORAL
  Filled 2021-09-29 (×5): qty 1

## 2021-09-29 MED ORDER — MAGNESIUM SULFATE 2 GM/50ML IV SOLN
2.0000 g | Freq: Once | INTRAVENOUS | Status: AC
  Administered 2021-09-29: 2 g via INTRAVENOUS
  Filled 2021-09-29: qty 50

## 2021-09-29 MED ORDER — SENNA 8.6 MG PO TABS
1.0000 | ORAL_TABLET | Freq: Every day | ORAL | 0 refills | Status: AC
  Filled 2021-09-29: qty 30, 30d supply, fill #0

## 2021-09-29 NOTE — Progress Notes (Signed)
3 Days Post-Op  Subjective: No nausea or vomiting.  Is passing flatus but has not had a bowel movement yet.  Minimal incisional pain.  Objective: Vital signs in last 24 hours: Temp:  [98.5 F (36.9 C)-99.4 F (37.4 C)] 98.5 F (36.9 C) (05/29 0555) Pulse Rate:  [92-95] 92 (05/29 0555) Resp:  [20] 20 (05/29 0555) BP: (95-100)/(53-57) 100/57 (05/29 0555) SpO2:  [97 %] 97 % (05/29 0555) Last BM Date : 09/20/21 (explosivice Diarrhea)  Intake/Output from previous day: 05/28 0701 - 05/29 0700 In: 1759.1 [P.O.:720; I.V.:1039.1] Out: -  Intake/Output this shift: No intake/output data recorded.  General appearance: alert, cooperative, and no distress Resp: clear to auscultation bilaterally Cardio: regular rate and rhythm, S1, S2 normal, no murmur, click, rub or gallop GI: Soft, occasional bowel sounds appreciated.  Incision healing well.  JP drainage serous in nature.  Lab Results:  Recent Labs    09/28/21 0426 09/29/21 0523  WBC 7.9 8.0  HGB 11.8* 11.0*  HCT 34.4* 32.5*  PLT 183 178   BMET Recent Labs    09/28/21 0426 09/29/21 0523  NA 135 134*  K 3.8 3.3*  CL 106 104  CO2 26 29  GLUCOSE 190* 169*  BUN 7* 5*  CREATININE 0.41* 0.44  CALCIUM 7.9* 7.6*   PT/INR No results for input(s): LABPROT, INR in the last 72 hours.  Studies/Results: No results found.  Anti-infectives: Anti-infectives (From admission, onward)    Start     Dose/Rate Route Frequency Ordered Stop   09/26/21 1200  ceFAZolin (ANCEF) IVPB 2g/100 mL premix        2 g 200 mL/hr over 30 Minutes Intravenous On call to O.R. 09/26/21 1105 09/26/21 1520       Assessment/Plan: s/p Procedure(s): HERNIORRHAPHY INCISIONAL PARTIAL SMALL BOWEL RESECTION Impression: Stable on postoperative day 3.  Awaiting full return of bowel function.  Hypophosphatemia and hypomagnesemia as well as hypokalemia all being addressed.  Anticipate discharge in next 24 to 48 hours.  We will add MiraLAX to patient's bowel  regimen.  LOS: 3 days    Franky Macho 09/29/2021

## 2021-09-29 NOTE — Assessment & Plan Note (Signed)
-   Repleting with 40 mg of KCl p.o. today

## 2021-09-29 NOTE — Progress Notes (Signed)
PROGRESS NOTE    Patient: Kelly Savage                            PCP: Fayrene Helper, MD                    DOB: Mar 03, 1956            DOA: 09/25/2021 [id number]             DOS: 09/29/2021, 11:04 AM   LOS: 3 days   Date of Service: The patient was seen and examined on 09/29/2021  Subjective:   Postop day #3 Seen and examined no acute distress, reporting of gas and bowel movement this morning No issues overnight  Brief Narrative:    Kelly Savage is a 66 y.o. female with medical history significant of with history of diabetes mellitus type 2, hypertension, and more presents to the ED with a chief complaint of nausea vomiting and abdominal pain.   Patient states this has been going on for 3 weeks but has been especially worse over the last 4 days.  The pain is diffuse across her abdomen, intermittent, and feels like burning or cramping.  She describes the pain is severe, lasting seconds-minutes.  The pain is more frequent and worse at night.  Eating makes the pain worse.  Patient had 1 episode of explosive watery bowel movement.  That was 6 days ago.  No bowel movement since then.  Patient has been throwing up.  That is also worse at night and worse with eating.    She denies any hematemesis.  Patient denies hematochezia and melena as well. Patient's last colonoscopy was several years ago.  Her last Cologuard was last year.  Patient does not smoke, does not drink, does not use illicit drugs.   She is vaccinated for COVID.  Patient is full code.    Assessment & Plan:   Principal Problem:   Small bowel obstruction (HCC) Active Problems:   Hypokalemia   Hypomagnesemia   Type 2 diabetes mellitus with vascular disease (HCC)   Hyperlipidemia with target LDL less than 100   Essential hypertension   Hernia, ventral incisional, with obstruction     Assessment and Plan: * Small bowel obstruction ([place]) -Postop day # 3 -09/26/2021 status post partial small bowel resection,  right hernia repair repeat drain in place  -Reporting of gas and bowel movement this morning -  - CT abdomen pelvis shows umbilical hernia containing a loop of small bowel with marked wall thickening and surrounding inflammation-resultant small bowel obstruction.  Findings worrisome for incarcerated/strangulated hernia -Dr. Arnoldo Morale following  -Pain control with pain scale -Nausea control with Zofran and Compazine    Hypomagnesemia - Your magnesium 1.5 this morning, repleted with 2 g of IV magnesium  Hypokalemia - Repleting with 40 mg of KCl p.o. today  Hernia, ventral incisional, with obstruction 09/26/2021 S/P HERNIORRHAPHY INCISIONAL PARTIAL SMALL BOWEL  RESECTION -Tolerated procedure well, stable,  Essential hypertension Continue benazepril -Blood pressure remained stable  Hyperlipidemia with target LDL less than 100 Continue Crestor  Type 2 diabetes mellitus with vascular disease ([place]) - Holding home oral hypoglycemics -Last hemoglobin A1c was 6.6 -Monitoring CBG every 4 hours, with SSI coverage  -------------------------------------------------------------------------------------------------------------  DVT prophylaxis:  enoxaparin (LOVENOX) injection 40 mg Start: 09/27/21 0800 SCD's Start: 09/26/21 1812  Code Status:   Code Status: Full Code  Family Communication:  With the  patient  no family present -Advance care planning has been discussed.   Admission status:   Status is: Inpatient   Patient remained stable likely can be discharged in 1 to 2 days  We will sign off!  Please call with any questions or concerns.     Procedures:   No admission procedures for hospital encounter.   Antimicrobials:  Anti-infectives (From admission, onward)    Start     Dose/Rate Route Frequency Ordered Stop   09/26/21 1200  ceFAZolin (ANCEF) IVPB 2g/100 mL premix        2 g 200 mL/hr over 30 Minutes Intravenous On call to O.R. 09/26/21 1105 09/26/21 1520         Medication:   Chlorhexidine Gluconate Cloth  6 each Topical Daily   enoxaparin (LOVENOX) injection  40 mg Subcutaneous Q24H   insulin aspart  0-15 Units Subcutaneous TID WC   pantoprazole  40 mg Oral Q1200   polyethylene glycol  17 g Oral BID    acetaminophen **OR** acetaminophen, alum & mag hydroxide-simeth, diphenhydrAMINE **OR** diphenhydrAMINE, HYDROmorphone (DILAUDID) injection, [COMPLETED] ketorolac **FOLLOWED BY** ketorolac, LORazepam, ondansetron **OR** ondansetron (ZOFRAN) IV, oxyCODONE, simethicone   Objective:   Vitals:   09/27/21 1934 09/28/21 0552 09/28/21 2149 09/29/21 0555  BP: (!) 108/59 102/65 (!) 95/53 (!) 100/57  Pulse: 99 89 95 92  Resp: 17  20 20   Temp: 98.9 F (37.2 C) 98.5 F (36.9 C) 99.4 F (37.4 C) 98.5 F (36.9 C)  TempSrc: Oral Oral Oral Oral  SpO2: 97% 94% 97% 97%  Weight:      Height:        Intake/Output Summary (Last 24 hours) at 09/29/2021 1104 Last data filed at 09/29/2021 0800 Gross per 24 hour  Intake 2098.31 ml  Output 15 ml  Net 2083.31 ml   Filed Weights   09/25/21 1547  Weight: 79.9 kg     Examination:      General:  AAO x 3,  cooperative, no distress;   HEENT:  Normocephalic, PERRL, otherwise with in Normal limits   Neuro:  CNII-XII intact. , normal motor and sensation, reflexes intact   Lungs:   Clear to auscultation BL, Respirations unlabored,  No wheezes / crackles  Cardio:    S1/S2, RRR, No murmure, No Rubs or Gallops   Abdomen:  Ventral abdominal surgical wound with dressing and right-sided JP drain in place- soft, mild diffuse, bowel sounds active all four quadrants, no guarding or peritoneal signs.  Muscular  skeletal:  Limited exam -global generalized weaknesses - in bed, able to move all 4 extremities,   2+ pulses,  symmetric, No pitting edema  Skin:  Dry, warm to touch, negative for any Rashes, Postop abdominal surgical wound with JP drain in place  Wounds: Please see nursing documentation         Postop abdominal wound-Domino surgical wound with dressing and JP in place         LABs:     Latest Ref Rng & Units 09/29/2021    5:23 AM 09/28/2021    4:26 AM 09/27/2021    6:15 AM  CBC  WBC 4.0 - 10.5 K/uL 8.0   7.9   6.7    Hemoglobin 12.0 - 15.0 g/dL 11.0   11.8   12.3    Hematocrit 36.0 - 46.0 % 32.5   34.4   35.9    Platelets 150 - 400 K/uL 178   183   213  Latest Ref Rng & Units 09/29/2021    5:23 AM 09/28/2021    4:26 AM 09/27/2021    6:15 AM  CMP  Glucose 70 - 99 mg/dL 169   190   170    BUN 8 - 23 mg/dL 5   7   12     Creatinine 0.44 - 1.00 mg/dL 0.44   0.41   0.59    Sodium 135 - 145 mmol/L 134   135   134    Potassium 3.5 - 5.1 mmol/L 3.3   3.8   3.3    Chloride 98 - 111 mmol/L 104   106   104    CO2 22 - 32 mmol/L 29   26   20     Calcium 8.9 - 10.3 mg/dL 7.6   7.9   7.8         Micro Results Recent Results (from the past 240 hour(s))  Surgical pcr screen     Status: None   Collection Time: 09/26/21  9:48 AM   Specimen: Nasal Mucosa; Nasal Swab  Result Value Ref Range Status   MRSA, PCR NEGATIVE NEGATIVE Final   Staphylococcus aureus NEGATIVE NEGATIVE Final    Comment: (NOTE) The Xpert SA Assay (FDA approved for NASAL specimens in patients 53 years of age and older), is one component of a comprehensive surveillance program. It is not intended to diagnose infection nor to guide or monitor treatment. Performed at [facility], [address] [place], [place] [address]     Radiology Reports No results found.  SIGNED: Deatra James, MD, FHM. [facility],  Pager (please use [email or web address] to page/text) Please use Epic Secure Chat for non-urgent communication (7AM-7PM)  If 7PM-7AM, please contact night-coverage [email or web address], 09/29/2021, 11:04 AM

## 2021-09-29 NOTE — Assessment & Plan Note (Signed)
-   Your magnesium 1.5 this morning, repleted with 2 g of IV magnesium

## 2021-09-30 ENCOUNTER — Encounter (HOSPITAL_COMMUNITY): Payer: Self-pay | Admitting: General Surgery

## 2021-09-30 ENCOUNTER — Other Ambulatory Visit (HOSPITAL_COMMUNITY): Payer: Self-pay

## 2021-09-30 LAB — BASIC METABOLIC PANEL
Anion gap: 6 (ref 5–15)
BUN: 6 mg/dL — ABNORMAL LOW (ref 8–23)
CO2: 29 mmol/L (ref 22–32)
Calcium: 8 mg/dL — ABNORMAL LOW (ref 8.9–10.3)
Chloride: 101 mmol/L (ref 98–111)
Creatinine, Ser: 0.46 mg/dL (ref 0.44–1.00)
GFR, Estimated: >60 mL/min (ref >60)
Glucose, Bld: 163 mg/dL — ABNORMAL HIGH (ref 70–99)
Potassium: 3.9 mmol/L (ref 3.5–5.1)
Sodium: 136 mmol/L (ref 135–145)

## 2021-09-30 LAB — GLUCOSE, CAPILLARY
Glucose-Capillary: 132 mg/dL — ABNORMAL HIGH (ref 70–99)
Glucose-Capillary: 177 mg/dL — ABNORMAL HIGH (ref 70–99)
Glucose-Capillary: 187 mg/dL — ABNORMAL HIGH (ref 70–99)
Glucose-Capillary: 155 mg/dL — ABNORMAL HIGH (ref 70–99)

## 2021-09-30 LAB — MAGNESIUM: Magnesium: 1.8 mg/dL (ref 1.7–2.4)

## 2021-09-30 LAB — PHOSPHORUS: Phosphorus: 3.2 mg/dL (ref 2.5–4.6)

## 2021-09-30 MED ORDER — MAGNESIUM HYDROXIDE 400 MG/5ML PO SUSP
30.0000 mL | Freq: Once | ORAL | Status: AC
  Administered 2021-09-30: 30 mL via ORAL
  Filled 2021-09-30: qty 30

## 2021-09-30 NOTE — Progress Notes (Signed)
4 Days Post-Op  Subjective: No complaints.  Is passing flatus.  No bowel movement yet.  Tolerating carb modified diet well.  Objective: Vital signs in last 24 hours: Temp:  [98.4 F (36.9 C)-99.7 F (37.6 C)] 98.7 F (37.1 C) (05/30 0454) Pulse Rate:  [79-101] 79 (05/30 0454) Resp:  [16-17] 16 (05/30 0454) BP: (105-128)/(58-76) 105/65 (05/30 0454) SpO2:  [97 %-98 %] 97 % (05/30 0454) Last BM Date : 09/20/21 (explosivice Diarrhea)  Intake/Output from previous day: 05/29 0701 - 05/30 0700 In: 785.7 [I.V.:582.3; IV Piggyback:203.4] Out: 15 [Drains:15] Intake/Output this shift: No intake/output data recorded.  General appearance: alert, cooperative, and no distress Resp: clear to auscultation bilaterally Cardio: regular rate and rhythm, S1, S2 normal, no murmur, click, rub or gallop GI: Soft, occasional bowel sounds appreciated.  Incision healing well.  JP drainage with minimal serosanguineous drainage present.  Lab Results:  Recent Labs    09/28/21 0426 09/29/21 0523  WBC 7.9 8.0  HGB 11.8* 11.0*  HCT 34.4* 32.5*  PLT 183 178   BMET Recent Labs    09/29/21 0523 09/30/21 0448  NA 134* 136  K 3.3* 3.9  CL 104 101  CO2 29 29  GLUCOSE 169* 163*  BUN 5* 6*  CREATININE 0.44 0.46  CALCIUM 7.6* 8.0*   PT/INR No results for input(s): LABPROT, INR in the last 72 hours.  Studies/Results: No results found.  Anti-infectives: Anti-infectives (From admission, onward)    Start     Dose/Rate Route Frequency Ordered Stop   09/26/21 1200  ceFAZolin (ANCEF) IVPB 2g/100 mL premix        2 g 200 mL/hr over 30 Minutes Intravenous On call to O.R. 09/26/21 1105 09/26/21 1520       Assessment/Plan: s/p Procedure(s): HERNIORRHAPHY INCISIONAL PARTIAL SMALL BOWEL RESECTION Impression: Postoperative day 4.  Awaiting return of bowel function.  Once bowel function returns fully, patient will be discharged.  Will reassess later today.  LOS: 4 days    Franky Macho 09/30/2021

## 2021-10-01 ENCOUNTER — Other Ambulatory Visit (HOSPITAL_COMMUNITY): Payer: Self-pay

## 2021-10-01 LAB — SURGICAL PATHOLOGY

## 2021-10-01 LAB — GLUCOSE, CAPILLARY: Glucose-Capillary: 155 mg/dL — ABNORMAL HIGH (ref 70–99)

## 2021-10-01 MED ORDER — BISACODYL 10 MG RE SUPP
10.0000 mg | Freq: Once | RECTAL | Status: DC
  Filled 2021-10-01: qty 1

## 2021-10-01 NOTE — Progress Notes (Signed)
Nsg Discharge Note  Admit Date:  09/25/2021 Discharge date: 10/01/2021   Kelly Savage to be D/C'd Home per MD order.  AVS completed.  Copy for chart, and copy for patient signed, and dated. Patient/caregiver able to verbalize understanding.  Discharge Medication: Allergies as of 10/01/2021   No Known Allergies      Medication List     STOP taking these medications    benazepril 5 MG tablet Commonly known as: LOTENSIN   diclofenac 75 MG EC tablet Commonly known as: VOLTAREN   Mounjaro 10 MG/0.5ML Pen Generic drug: tirzepatide   Mounjaro 7.5 MG/0.5ML Pen Generic drug: tirzepatide       TAKE these medications    aspirin EC 81 MG tablet Take 1 tablet (81 mg total) by mouth daily. Start taking on: October 06, 2021 What changed: These instructions start on October 06, 2021. If you are unsure what to do until then, ask your doctor or other care provider.   blood glucose meter kit and supplies Dispense based on patient and insurance preference. Three times daily testing dx e11.65   calcium carbonate 1500 (600 Ca) MG Tabs tablet Commonly known as: OSCAL Take 1,200 mg by mouth every evening.   freestyle lancets Use as instructed to test blood sugar once daily.   FREESTYLE LITE test strip Generic drug: glucose blood Use as instructed to test blood sugar once daily.   glipiZIDE 10 MG 24 hr tablet Commonly known as: GLUCOTROL XL Take 2 tablets (20 mg total) by mouth in the morning at breakfast What changed: Another medication with the same name was removed. Continue taking this medication, and follow the directions you see here.   Jardiance 10 MG Tabs tablet Generic drug: empagliflozin Take 1 tablet (10 mg total) by mouth daily before breakfast.   metFORMIN 500 MG tablet Commonly known as: GLUCOPHAGE Take 2 tablets (1,000 mg total) by mouth 2 (two) times daily for blood sugar.   ondansetron 4 MG disintegrating tablet Commonly known as: ZOFRAN-ODT Allow1-2 tablets (4-8  mg total) to dissolve in the mouth every 8 (eight) hours as needed for nausea/vomiting   Pfizer-BioNT COVID-19 Vac-TriS Susp injection Generic drug: COVID-19 mRNA Vac-TriS (Pfizer) Inject into the muscle.   rosuvastatin 20 MG tablet Commonly known as: Crestor Take 1 tablet (20 mg total) by mouth daily.   senna 8.6 MG Tabs tablet Commonly known as: SENOKOT Take 1 tablet (8.6 mg total) by mouth daily.        Discharge Assessment: Vitals:   09/30/21 2052 10/01/21 0413  BP: 102/66 (!) 102/59  Pulse: 83 81  Resp: 20 15  Temp: 98.2 F (36.8 C) 98.9 F (37.2 C)  SpO2: 100% 96%   Skin clean, dry and intact without evidence of skin break down, no evidence of skin tears noted. IV catheter discontinued intact. Site without signs and symptoms of complications - no redness or edema noted at insertion site, patient denies c/o pain - only slight tenderness at site.  Dressing with slight pressure applied.  D/c Instructions-Education: Discharge instructions given to patient/family with verbalized understanding. D/c education completed with patient/family including follow up instructions, medication list, d/c activities limitations if indicated, with other d/c instructions as indicated by MD - patient able to verbalize understanding, all questions fully answered. Patient instructed to return to ED, call 911, or call MD for any changes in condition.  Patient escorted via [place], and D/C home via private auto.  Dorcas Mcmurray, LPN 9/56/2130 86:57 AM

## 2021-10-01 NOTE — Discharge Summary (Signed)
Physician Discharge Summary  Patient ID: MADELEIN MAHADEO MRN: [id number] DOB/AGE: 1955/06/23 66 y.o.  Admit date: 09/25/2021 Discharge date: 10/01/2021  Admission Diagnoses: Incarcerated ventral hernia  Discharge Diagnoses:  Principal Problem:   Small bowel obstruction ([place]) Active Problems:   Type 2 diabetes mellitus with vascular disease (HCC)   Hyperlipidemia with target LDL less than 100   Essential hypertension   Hernia, ventral incisional, with obstruction   Hypokalemia   Hypomagnesemia Hypophosphatemia  Discharged Condition: good  Hospital Course: Patient is a 66 year old white female who presented to the emergency room with worsening abdominal pain, nausea, vomiting, and swelling from a periumbilical known hernia.  A CT scan of the abdomen revealed incarcerated small bowel with a partial small bowel obstruction.  The patient was taken to the operating room on 09/26/2021 and underwent a partial small bowel resection as well as a ventral herniorrhaphy.  Her postoperative course was remarkable for hypokalemia, hypomagnesemia, and hypophosphatemia.  Her diet was advanced out difficulty once her bowel function returned.  Final pathology is still pending.  The patient is being discharged home on 10/01/2021 in good and improving condition.  Treatments: surgery: Partial small bowel resection, ventral herniorrhaphy on 09/26/2021  Discharge Exam: Blood pressure (!) 102/59, pulse 81, temperature 98.9 F (37.2 C), temperature source Oral, resp. rate 15, height '5\' 8"'  (1.727 m), weight 79.9 kg, SpO2 96 %. General appearance: alert, cooperative, and no distress Resp: clear to auscultation bilaterally Cardio: regular rate and rhythm, S1, S2 normal, no murmur, click, rub or gallop GI: Soft, nontender.  JP drain removed.  Incision healing well without drainage.  Bowel sounds present.  Disposition: Discharge disposition: 01-Home or Self Care       Discharge Instructions     Diet - low  sodium heart healthy   Complete by: As directed    Increase activity slowly   Complete by: As directed       Allergies as of 10/01/2021   No Known Allergies      Medication List     STOP taking these medications    benazepril 5 MG tablet Commonly known as: LOTENSIN   diclofenac 75 MG EC tablet Commonly known as: VOLTAREN   Mounjaro 10 MG/0.5ML Pen Generic drug: tirzepatide   Mounjaro 7.5 MG/0.5ML Pen Generic drug: tirzepatide       TAKE these medications    aspirin EC 81 MG tablet Take 1 tablet (81 mg total) by mouth daily. Start taking on: October 06, 2021 What changed: These instructions start on October 06, 2021. If you are unsure what to do until then, ask your doctor or other care provider.   blood glucose meter kit and supplies Dispense based on patient and insurance preference. Three times daily testing dx e11.65   calcium carbonate 1500 (600 Ca) MG Tabs tablet Commonly known as: OSCAL Take 1,200 mg by mouth every evening.   freestyle lancets Use as instructed to test blood sugar once daily.   FREESTYLE LITE test strip Generic drug: glucose blood Use as instructed to test blood sugar once daily.   glipiZIDE 10 MG 24 hr tablet Commonly known as: GLUCOTROL XL Take 2 tablets (20 mg total) by mouth in the morning at breakfast What changed: Another medication with the same name was removed. Continue taking this medication, and follow the directions you see here.   Jardiance 10 MG Tabs tablet Generic drug: empagliflozin Take 1 tablet (10 mg total) by mouth daily before breakfast.   metFORMIN 500 MG tablet  Commonly known as: GLUCOPHAGE Take 2 tablets (1,000 mg total) by mouth 2 (two) times daily for blood sugar.   ondansetron 4 MG disintegrating tablet Commonly known as: ZOFRAN-ODT Allow1-2 tablets (4-8 mg total) to dissolve in the mouth every 8 (eight) hours as needed for nausea/vomiting   Pfizer-BioNT COVID-19 Vac-TriS Susp injection Generic drug:  COVID-19 mRNA Vac-TriS (Pfizer) Inject into the muscle.   rosuvastatin 20 MG tablet Commonly known as: Crestor Take 1 tablet (20 mg total) by mouth daily.   senna 8.6 MG Tabs tablet Commonly known as: SENOKOT Take 1 tablet (8.6 mg total) by mouth daily.        Follow-up Information     Aviva Signs, MD. Schedule an appointment as soon as possible for a visit on 10/07/2021.   Specialty: General Surgery Contact information: [address] [address] [phone]                 Signed: Aviva Signs 10/01/2021, 8:57 AM

## 2021-10-02 ENCOUNTER — Other Ambulatory Visit (HOSPITAL_COMMUNITY): Payer: Self-pay

## 2021-10-02 ENCOUNTER — Other Ambulatory Visit: Payer: Self-pay

## 2021-10-02 ENCOUNTER — Other Ambulatory Visit (INDEPENDENT_AMBULATORY_CARE_PROVIDER_SITE_OTHER): Payer: No Typology Code available for payment source | Admitting: General Surgery

## 2021-10-02 DIAGNOSIS — I10 Essential (primary) hypertension: Secondary | ICD-10-CM

## 2021-10-02 DIAGNOSIS — Z09 Encounter for follow-up examination after completed treatment for conditions other than malignant neoplasm: Secondary | ICD-10-CM

## 2021-10-02 DIAGNOSIS — E1159 Type 2 diabetes mellitus with other circulatory complications: Secondary | ICD-10-CM

## 2021-10-02 MED ORDER — HYDROCODONE-ACETAMINOPHEN 5-325 MG PO TABS
1.0000 | ORAL_TABLET | ORAL | 0 refills | Status: DC | PRN
  Filled 2021-10-02: qty 30, 5d supply, fill #0

## 2021-10-02 NOTE — Progress Notes (Signed)
[place] for pain 

## 2021-10-02 NOTE — Patient Outreach (Addendum)
Received a hospital discharge notification from [facility] for Kelly Savage . The Primary Care Physician has a [facility] Embedded Nurse.   I have sent a referral to the Embedded Team.   Iverson Alamin, Donivan Scull [facility] Care Management Assistant [facility] Care Management [phone]

## 2021-10-03 ENCOUNTER — Telehealth: Payer: Self-pay | Admitting: *Deleted

## 2021-10-03 NOTE — Telephone Encounter (Signed)
Transition Care Management Follow-up Telephone Call Date of discharge and from where: 10-01-21 Kelly Savage  How have you been since you were released from the hospital? Feeling weak  Any questions or concerns? No  Items Reviewed: Did the pt receive and understand the discharge instructions provided? Yes  Medications obtained and verified? Yes  Other? No  Any new allergies since your discharge? No  Dietary orders reviewed? Yes Do you have support at home? Yes   Home Care and Equipment/Supplies: Were home health services ordered? no If so, what is the name of the agency? NA  Has the agency set up a time to come to the patient's home? not applicable Were any new equipment or medical supplies ordered?  No What is the name of the medical supply agency? NA Were you able to get the supplies/equipment? not applicable Do you have any questions related to the use of the equipment or supplies? No  Functional Questionnaire: (I = Independent and D = Dependent) ADLs: i  Bathing/Dressing- i  Meal Prep- i  Eating- i  Maintaining continence- i  Transferring/Ambulation- i  Managing Meds- i  Follow up appointments reviewed:  PCP Hospital f/u appt confirmed? Yes  Scheduled to see Dr Lodema Hong on 10-09-21 @ 8:00. Specialist Hospital f/u appt confirmed? Yes  Scheduled to see Lovell Sheehan on 10-07-21 Are transportation arrangements needed? No  If their condition worsens, is the pt aware to call PCP or go to the Emergency Dept.? Yes Was the patient provided with contact information for the PCP's office or ED? Yes Was to pt encouraged to call back with questions or concerns? Yes

## 2021-10-03 NOTE — Chronic Care Management (AMB) (Signed)
  Care Management   Outreach Note  10/03/2021 Name: Kelly Savage MRN: [id number] DOB: 02-04-1956  Referred by: Kerri Perches, MD Reason for referral : Care Coordination (Initial outreach to schedule referral with [facility] )   An unsuccessful telephone outreach was attempted today. The patient was referred to the case management team for assistance with care management and care coordination.   Follow Up Plan:  A HIPAA compliant phone message was left for the patient providing contact information and requesting a return call. The care management team will reach out to the patient again over the next 7 days. If patient returns call to provider office, please advise to call Embedded Care Management Care Guide Misty Stanley at [phone].  Gwenevere Ghazi  Care Guide, Embedded Care Coordination [facility] Management  Direct Dial: [phone]

## 2021-10-06 ENCOUNTER — Other Ambulatory Visit (HOSPITAL_COMMUNITY): Payer: Self-pay

## 2021-10-06 ENCOUNTER — Other Ambulatory Visit (INDEPENDENT_AMBULATORY_CARE_PROVIDER_SITE_OTHER): Payer: No Typology Code available for payment source | Admitting: General Surgery

## 2021-10-06 ENCOUNTER — Telehealth: Payer: Self-pay | Admitting: *Deleted

## 2021-10-06 DIAGNOSIS — Z09 Encounter for follow-up examination after completed treatment for conditions other than malignant neoplasm: Secondary | ICD-10-CM

## 2021-10-06 MED ORDER — CEPHALEXIN 500 MG PO CAPS
500.0000 mg | ORAL_CAPSULE | Freq: Four times a day (QID) | ORAL | 0 refills | Status: DC
  Filled 2021-10-06: qty 28, 7d supply, fill #0

## 2021-10-06 NOTE — Telephone Encounter (Signed)
Received call from patient [phone]~ telephone.   Surgical Date: 09/26/2021 Procedure: Incisional Hernia Repair. Small Bowel Resection   Patient reports that skin surrounding incision is red and tender. States that redness is spreading out from incision. Reports blood tinged foul smelling drainage from center of incision. Requesting ABTx.   Denies increased abdominal pain, fever/ chills.   Allergies: NKDA Pharmacy: [facility]  Please advise.

## 2021-10-06 NOTE — Telephone Encounter (Signed)
Provider called patient and discussed with her.

## 2021-10-06 NOTE — Progress Notes (Signed)
Keflex for incision redness Will see patient tomorrow.

## 2021-10-07 ENCOUNTER — Encounter: Payer: Self-pay | Admitting: General Surgery

## 2021-10-07 ENCOUNTER — Ambulatory Visit (INDEPENDENT_AMBULATORY_CARE_PROVIDER_SITE_OTHER): Payer: No Typology Code available for payment source | Admitting: General Surgery

## 2021-10-07 ENCOUNTER — Telehealth: Payer: Self-pay | Admitting: Family Medicine

## 2021-10-07 VITALS — BP 97/63 | HR 88 | Temp 97.7°F | Resp 12 | Ht 68.0 in | Wt 173.0 lb

## 2021-10-07 DIAGNOSIS — Z09 Encounter for follow-up examination after completed treatment for conditions other than malignant neoplasm: Secondary | ICD-10-CM

## 2021-10-07 NOTE — Telephone Encounter (Signed)
FYI- msg from patient

## 2021-10-07 NOTE — Progress Notes (Signed)
Subjective:     Kelly Savage  Here for postoperative visit, status post partial small bowel resection and ventral hernia repair.  Patient started having some cloudy yellowish drainage from her umbilicus with mild redness around the incision several days ago.  She was started empirically on Keflex.  She states that the redness seems to have subsided, though she still has the cloudy yellow drainage.  She denies any fevers.  Her husband recently passed away a couple days ago. Objective:    BP 97/63   Pulse 88   Temp 97.7 F (36.5 C) (Oral)   Resp 12   Ht 5\' 8"  (1.727 m)   Wt 173 lb (78.5 kg)   SpO2 96%   BMI 26.30 kg/m   General:  alert, cooperative, and no distress  Abdomen is soft with an intact incision line.  No drainage is noted from the incision line.  Mild erythema is noted.  She does have a punctate wound in the umbilicus which is draining cloudy yellow fluid.  One half of staples removed.  I did clean the umbilicus and reapply a dressing.  Triple antibiotic ointment was also applied to the umbilicus.     Assessment:    Serous drainage from umbilicus which may be due to skin disruption within the umbilicus.  The incision is healing well.    Plan:   Continue taking Keflex.  Would clean wound with soap and water twice a day.  Dressings given.  Follow-up here in 1 week.

## 2021-10-07 NOTE — Telephone Encounter (Signed)
Pt called stating she was wanting to let you know she is follow up & seeing surgeon today. Also, to let you know her husband passed away. She states she will r/s when she is settled

## 2021-10-07 NOTE — Telephone Encounter (Signed)
I spoke directly with Ms Friedt

## 2021-10-08 ENCOUNTER — Inpatient Hospital Stay: Payer: No Typology Code available for payment source | Admitting: Family Medicine

## 2021-10-08 NOTE — Chronic Care Management (AMB) (Signed)
  Care Management   Note  10/08/2021 Name: RUBI TOOLEY MRN: [id number] DOB: 19-Nov-1955  Berna Spare is a 66 y.o. year old female who is a primary care patient of Lodema Hong Milus Mallick, MD. I reached out to Berna Spare by phone today offer care coordination services.   Ms. Lalley was given information about care management services today including:  Care management services include personalized support from designated clinical staff supervised by her physician, including individualized plan of care and coordination with other care providers 24/7 contact phone numbers for assistance for urgent and routine care needs. The patient may stop care management services at any time by phone call to the office staff.  Patient did not agree to enrollment in care management services and does not wish to consider at this time.  Follow up plan: The care management team is available to follow up with the patient after provider conversation with the patient regarding recommendation for care management engagement and subsequent re-referral to the care management team.   [facility] Guide, Embedded Care Coordination [facility]  Care Management  Direct Dial: [phone]

## 2021-10-14 ENCOUNTER — Other Ambulatory Visit: Payer: Self-pay | Admitting: Family Medicine

## 2021-10-14 ENCOUNTER — Encounter: Payer: Self-pay | Admitting: *Deleted

## 2021-10-14 ENCOUNTER — Ambulatory Visit (INDEPENDENT_AMBULATORY_CARE_PROVIDER_SITE_OTHER): Payer: No Typology Code available for payment source | Admitting: General Surgery

## 2021-10-14 ENCOUNTER — Encounter: Payer: Self-pay | Admitting: General Surgery

## 2021-10-14 ENCOUNTER — Other Ambulatory Visit (HOSPITAL_COMMUNITY): Payer: Self-pay

## 2021-10-14 VITALS — BP 102/66 | HR 71 | Temp 98.1°F | Resp 12 | Ht 68.0 in | Wt 177.0 lb

## 2021-10-14 DIAGNOSIS — Z09 Encounter for follow-up examination after completed treatment for conditions other than malignant neoplasm: Secondary | ICD-10-CM

## 2021-10-14 DIAGNOSIS — E1159 Type 2 diabetes mellitus with other circulatory complications: Secondary | ICD-10-CM

## 2021-10-14 MED ORDER — FREESTYLE LITE TEST VI STRP
ORAL_STRIP | 11 refills | Status: DC
  Filled 2021-10-14: qty 50, 50d supply, fill #0
  Filled 2022-01-09: qty 50, 50d supply, fill #1
  Filled 2022-03-08: qty 50, 50d supply, fill #2
  Filled 2022-04-28: qty 50, 50d supply, fill #3
  Filled 2022-06-24: qty 50, 50d supply, fill #4
  Filled 2022-09-06: qty 100, 90d supply, fill #5

## 2021-10-14 NOTE — Progress Notes (Signed)
Subjective:     Kelly Savage  Here for follow-up wound check.  Patient thinks the drainage has decreased a little bit.  She denies any fever or chills.  She has no abdominal pain. Objective:    BP 102/66   Pulse 71   Temp 98.1 F (36.7 C) (Oral)   Resp 12   Ht 5\' 8"  (1.727 m)   Wt 177 lb (80.3 kg)   SpO2 96%   BMI 26.91 kg/m   General:  alert, cooperative, and no distress  Abdomen is soft.  Incision healing well.  Remaining staples removed.  A's punctate hole at the base of the umbilicus was present, silver nitrate applied.  No purulent drainage present.  No erythema present.     Assessment:    Postoperative wound drainage is subsiding.    Plan:   Continue keeping wound clean and dry.  Follow-up here next week if you have any questions or have persistent drainage.  May return to work without restrictions on 10/20/2021.

## 2021-10-16 ENCOUNTER — Other Ambulatory Visit (HOSPITAL_COMMUNITY): Payer: Self-pay

## 2021-10-16 ENCOUNTER — Ambulatory Visit (INDEPENDENT_AMBULATORY_CARE_PROVIDER_SITE_OTHER): Payer: No Typology Code available for payment source | Admitting: Family Medicine

## 2021-10-16 ENCOUNTER — Encounter: Payer: Self-pay | Admitting: Family Medicine

## 2021-10-16 VITALS — BP 122/80 | HR 71 | Resp 16 | Ht 68.0 in | Wt 178.0 lb

## 2021-10-16 DIAGNOSIS — Z09 Encounter for follow-up examination after completed treatment for conditions other than malignant neoplasm: Secondary | ICD-10-CM | POA: Diagnosis not present

## 2021-10-16 DIAGNOSIS — K430 Incisional hernia with obstruction, without gangrene: Secondary | ICD-10-CM | POA: Diagnosis not present

## 2021-10-16 DIAGNOSIS — E1159 Type 2 diabetes mellitus with other circulatory complications: Secondary | ICD-10-CM

## 2021-10-16 DIAGNOSIS — I10 Essential (primary) hypertension: Secondary | ICD-10-CM

## 2021-10-16 DIAGNOSIS — Z1231 Encounter for screening mammogram for malignant neoplasm of breast: Secondary | ICD-10-CM

## 2021-10-16 MED ORDER — DICLOFENAC SODIUM 75 MG PO TBEC
75.0000 mg | DELAYED_RELEASE_TABLET | Freq: Every day | ORAL | 0 refills | Status: DC | PRN
  Filled 2021-10-16: qty 90, 90d supply, fill #0

## 2021-10-16 NOTE — Patient Instructions (Addendum)
Annual exam end August , call if you need me sooner  Please schedule Sept mammogram at checkout    CBC, chem 7 and EGFR today  Stop benazepril  Voltaren is being prescribed  Work excuse/ FMLA will be completed as requested  Thanks for choosing [facility], we consider it a privelige to serve you.

## 2021-10-17 ENCOUNTER — Other Ambulatory Visit (HOSPITAL_COMMUNITY): Payer: Self-pay

## 2021-10-17 LAB — BMP8+EGFR
BUN/Creatinine Ratio: 26 (ref 12–28)
BUN: 16 mg/dL (ref 8–27)
CO2: 23 mmol/L (ref 20–29)
Calcium: 9.3 mg/dL (ref 8.7–10.3)
Chloride: 103 mmol/L (ref 96–106)
Creatinine, Ser: 0.61 mg/dL (ref 0.57–1.00)
Glucose: 103 mg/dL — ABNORMAL HIGH (ref 70–99)
Potassium: 4.3 mmol/L (ref 3.5–5.2)
Sodium: 141 mmol/L (ref 134–144)
eGFR: 99 mL/min/{1.73_m2} (ref >59)

## 2021-10-17 LAB — CBC
Hematocrit: 37.1 % (ref 34.0–46.6)
Hemoglobin: 12.4 g/dL (ref 11.1–15.9)
MCH: 30.5 pg (ref 26.6–33.0)
MCHC: 33.4 g/dL (ref 31.5–35.7)
MCV: 91 fL (ref 79–97)
Platelets: 351 10*3/uL (ref 150–450)
RBC: 4.07 x10E6/uL (ref 3.77–5.28)
RDW: 14.1 % (ref 11.7–15.4)
WBC: 8 10*3/uL (ref 3.4–10.8)

## 2021-10-20 ENCOUNTER — Other Ambulatory Visit (HOSPITAL_COMMUNITY): Payer: Self-pay

## 2021-10-21 ENCOUNTER — Telehealth: Payer: Self-pay | Admitting: *Deleted

## 2021-10-21 ENCOUNTER — Other Ambulatory Visit (HOSPITAL_COMMUNITY): Payer: Self-pay

## 2021-10-21 NOTE — Telephone Encounter (Signed)
Received call from patient.   Reports that drainage has resolved.   States that she would like to extend her leave until 10/27/2021.  Dr. Lovell Sheehan made aware and agreeable to plan.

## 2021-10-22 ENCOUNTER — Other Ambulatory Visit (HOSPITAL_COMMUNITY): Payer: Self-pay

## 2021-10-23 ENCOUNTER — Other Ambulatory Visit (HOSPITAL_COMMUNITY): Payer: Self-pay

## 2021-10-26 ENCOUNTER — Encounter: Payer: Self-pay | Admitting: Family Medicine

## 2021-10-26 DIAGNOSIS — Z09 Encounter for follow-up examination after completed treatment for conditions other than malignant neoplasm: Secondary | ICD-10-CM | POA: Insufficient documentation

## 2021-10-27 ENCOUNTER — Encounter: Payer: Self-pay | Admitting: Family Medicine

## 2021-10-29 ENCOUNTER — Telehealth: Payer: Self-pay | Admitting: Family Medicine

## 2021-10-29 ENCOUNTER — Encounter: Payer: Self-pay | Admitting: Family Medicine

## 2021-10-29 DIAGNOSIS — Z0279 Encounter for issue of other medical certificate: Secondary | ICD-10-CM

## 2021-10-29 NOTE — Telephone Encounter (Signed)
FMLA   Noted  Sleeved copied.  Original was placed In provider box  Copy in brown folder up front

## 2021-10-31 NOTE — Telephone Encounter (Signed)
Forms were faxed

## 2021-11-03 ENCOUNTER — Other Ambulatory Visit (HOSPITAL_COMMUNITY): Payer: Self-pay

## 2021-12-04 ENCOUNTER — Other Ambulatory Visit: Payer: Self-pay | Admitting: Family Medicine

## 2021-12-05 ENCOUNTER — Other Ambulatory Visit (HOSPITAL_COMMUNITY): Payer: Self-pay

## 2021-12-05 MED ORDER — METFORMIN HCL 500 MG PO TABS
1000.0000 mg | ORAL_TABLET | Freq: Two times a day (BID) | ORAL | 1 refills | Status: DC
  Filled 2021-12-05: qty 360, 90d supply, fill #0

## 2021-12-19 ENCOUNTER — Ambulatory Visit (INDEPENDENT_AMBULATORY_CARE_PROVIDER_SITE_OTHER): Payer: No Typology Code available for payment source | Admitting: Family Medicine

## 2021-12-19 ENCOUNTER — Other Ambulatory Visit (HOSPITAL_COMMUNITY)
Admission: RE | Admit: 2021-12-19 | Discharge: 2021-12-19 | Disposition: A | Discharge status: Home / Self Care | Payer: No Typology Code available for payment source | Source: Ambulatory Visit | Attending: Family Medicine | Admitting: Family Medicine

## 2021-12-19 ENCOUNTER — Encounter: Payer: Self-pay | Admitting: Family Medicine

## 2021-12-19 VITALS — BP 120/76 | HR 65 | Ht 68.0 in | Wt 187.0 lb

## 2021-12-19 DIAGNOSIS — Z1231 Encounter for screening mammogram for malignant neoplasm of breast: Secondary | ICD-10-CM | POA: Diagnosis not present

## 2021-12-19 DIAGNOSIS — E559 Vitamin D deficiency, unspecified: Secondary | ICD-10-CM | POA: Diagnosis not present

## 2021-12-19 DIAGNOSIS — E1159 Type 2 diabetes mellitus with other circulatory complications: Secondary | ICD-10-CM

## 2021-12-19 DIAGNOSIS — Z0000 Encounter for general adult medical examination without abnormal findings: Secondary | ICD-10-CM | POA: Diagnosis not present

## 2021-12-19 DIAGNOSIS — Z124 Encounter for screening for malignant neoplasm of cervix: Secondary | ICD-10-CM | POA: Insufficient documentation

## 2021-12-19 NOTE — Patient Instructions (Addendum)
F/U in 5 months, call if you need me sooner  Labs today, lipid, cmp and eGFr, TSH, vit D  Please schedule and  get mammogram and eye exam  Resume benazepril 5 mg one daily  It is important that you exercise regularly at least 30 minutes 5 times a week. If you develop chest pain, have severe difficulty breathing, or feel very tired, stop exercising immediately and seek medical attention   Think about what you will eat, plan ahead. Choose " clean, green, fresh or frozen" over canned, processed or packaged foods which are more sugary, salty and fatty. 70 to 75% of food eaten should be vegetables and fruit. Three meals at set times with snacks allowed between meals, but they must be fruit or vegetables. Aim to eat over a 12 hour period , example 7 am to 7 pm, and STOP after  your last meal of the day. Drink water,generally about 64 ounces per day, no other drink is as healthy. Fruit juice is best enjoyed in a healthy way, by EATING the fruit.   Thanks for choosing [facility], we consider it a privelige to serve you.

## 2021-12-19 NOTE — Assessment & Plan Note (Signed)

## 2021-12-19 NOTE — Progress Notes (Signed)
    Kelly Savage     MRN: [id number]      DOB: 04-Feb-1956  HPI: Patient is in for annual physical exam. No other health concerns are expressed or addressed at the visit. Recent labs,  are reviewed. Immunization is reviewed , and  updated if needed.   PE: BP 120/76 (BP Location: Left Arm, Patient Position: Sitting, Cuff Size: Large)   Pulse 65   Ht 5\' 8"  (1.727 m)   Wt 187 lb (84.8 kg)   SpO2 93%   BMI 28.43 kg/m   Pleasant  female, alert and oriented x 3, in no cardio-pulmonary distress. Afebrile. HEENT No facial trauma or asymetry. Sinuses non tender.  Extra occullar muscles intact.. External ears normal, . Neck: supple, no adenopathy,JVD or thyromegaly.No bruits.  Chest: Clear to ascultation bilaterally.No crackles or wheezes. Non tender to palpation  Breast: No asymetry,no masses or lumps. No tenderness. No nipple discharge or inversion. No axillary or supraclavicular adenopathy  Cardiovascular system; Heart sounds normal,  S1 and  S2 ,no S3.  No murmur, or thrill. Apical beat not displaced Peripheral pulses normal.  Abdomen: Soft, non tender, no organomegaly or masses. No bruits. Bowel sounds normal. No guarding, tenderness or rebound.   GU: External genitalia normal female genitalia , normal female distribution of hair. No lesions. Urethral meatus normal in size, no  Prolapse, no lesions visibly  Present. Bladder non tender. Vagina pink and moist , with no visible lesions , discharge present . Adequate pelvic support no  cystocele or rectocele noted Cervix pink and appears healthy, no lesions or ulcerations noted, no discharge noted from os Uterus normal size, no adnexal masses, no cervical motion or adnexal tenderness.   Musculoskeletal exam: Full ROM of spine, hips , shoulders and knees. No deformity ,swelling or crepitus noted. No muscle wasting or atrophy.   Neurologic: Cranial nerves 2 to 12 intact. Power, tone ,sensation and reflexes normal  throughout. No disturbance in gait. No tremor.  Skin: Intact, no ulceration, erythema , scaling or rash noted. Pigmentation normal throughout  Psych; Normal mood and affect. Judgement and concentration normal   Assessment & Plan:  Annual physical exam Annual exam as documented. Counseling done  re healthy lifestyle involving commitment to 150 minutes exercise per week, heart healthy diet, and attaining healthy weight.The importance of adequate sleep also discussed. Regular seat belt use and home safety, is also discussed. Changes in health habits are decided on by the patient with goals and time frames  set for achieving them. Immunization and cancer screening needs are specifically addressed at this visit.

## 2021-12-21 LAB — CMP14+EGFR
ALT: 12 IU/L (ref 0–32)
AST: 20 IU/L (ref 0–40)
Albumin/Globulin Ratio: 1.8 (ref 1.2–2.2)
Albumin: 4.4 g/dL (ref 3.9–4.9)
Alkaline Phosphatase: 79 IU/L (ref 44–121)
BUN/Creatinine Ratio: 16 (ref 12–28)
BUN: 12 mg/dL (ref 8–27)
Bilirubin Total: 1.1 mg/dL (ref 0.0–1.2)
CO2: 23 mmol/L (ref 20–29)
Calcium: 9.6 mg/dL (ref 8.7–10.3)
Chloride: 102 mmol/L (ref 96–106)
Creatinine, Ser: 0.77 mg/dL (ref 0.57–1.00)
Globulin, Total: 2.4 g/dL (ref 1.5–4.5)
Glucose: 129 mg/dL — ABNORMAL HIGH (ref 70–99)
Potassium: 4.5 mmol/L (ref 3.5–5.2)
Sodium: 141 mmol/L (ref 134–144)
Total Protein: 6.8 g/dL (ref 6.0–8.5)
eGFR: 86 mL/min/{1.73_m2} (ref >59)

## 2021-12-21 LAB — LIPID PANEL
Chol/HDL Ratio: 1.7 ratio (ref 0.0–4.4)
Cholesterol, Total: 131 mg/dL (ref 100–199)
HDL: 75 mg/dL (ref >39)
LDL Chol Calc (NIH): 41 mg/dL (ref 0–99)
Triglycerides: 76 mg/dL (ref 0–149)
VLDL Cholesterol Cal: 15 mg/dL (ref 5–40)

## 2021-12-21 LAB — VITAMIN D 25 HYDROXY (VIT D DEFICIENCY, FRACTURES): Vit D, 25-Hydroxy: 28.7 ng/mL — ABNORMAL LOW (ref 30.0–100.0)

## 2021-12-21 LAB — TSH: TSH: 2.13 u[IU]/mL (ref 0.450–4.500)

## 2021-12-26 LAB — CYTOLOGY - PAP
Comment: NEGATIVE
Diagnosis: NEGATIVE
High risk HPV: NEGATIVE

## 2022-01-09 ENCOUNTER — Other Ambulatory Visit (HOSPITAL_COMMUNITY): Payer: Self-pay

## 2022-01-12 ENCOUNTER — Other Ambulatory Visit (HOSPITAL_COMMUNITY): Payer: Self-pay

## 2022-01-24 ENCOUNTER — Other Ambulatory Visit: Payer: Self-pay | Admitting: Family Medicine

## 2022-01-26 ENCOUNTER — Other Ambulatory Visit: Payer: Self-pay

## 2022-01-26 ENCOUNTER — Encounter: Payer: Self-pay | Admitting: Family Medicine

## 2022-01-26 ENCOUNTER — Other Ambulatory Visit (HOSPITAL_COMMUNITY): Payer: Self-pay

## 2022-01-26 MED ORDER — GLIPIZIDE ER 10 MG PO TB24
20.0000 mg | ORAL_TABLET | Freq: Every morning | ORAL | 1 refills | Status: DC
  Filled 2022-01-26: qty 180, 90d supply, fill #0
  Filled 2022-04-28: qty 180, 90d supply, fill #1

## 2022-01-26 MED ORDER — DICLOFENAC SODIUM 75 MG PO TBEC
75.0000 mg | DELAYED_RELEASE_TABLET | Freq: Every day | ORAL | 0 refills | Status: DC | PRN
  Filled 2022-01-26: qty 90, 90d supply, fill #0

## 2022-01-26 MED ORDER — BENAZEPRIL HCL 5 MG PO TABS
5.0000 mg | ORAL_TABLET | Freq: Every day | ORAL | 2 refills | Status: DC
  Filled 2022-01-26: qty 90, 90d supply, fill #0
  Filled 2022-04-09: qty 90, 90d supply, fill #1
  Filled 2022-07-26: qty 90, 90d supply, fill #2

## 2022-01-28 ENCOUNTER — Other Ambulatory Visit (HOSPITAL_COMMUNITY): Payer: Self-pay

## 2022-01-28 MED ORDER — JANUMET XR 50-1000 MG PO TB24
1.0000 | ORAL_TABLET | Freq: Two times a day (BID) | ORAL | 5 refills | Status: DC
  Filled 2022-01-28: qty 60, 30d supply, fill #0
  Filled 2022-02-24: qty 60, 30d supply, fill #1
  Filled 2022-03-25: qty 60, 30d supply, fill #2
  Filled 2022-04-29: qty 60, 30d supply, fill #3
  Filled 2022-06-01: qty 60, 30d supply, fill #4
  Filled 2022-07-01: qty 60, 30d supply, fill #5

## 2022-02-24 ENCOUNTER — Other Ambulatory Visit (HOSPITAL_COMMUNITY): Payer: Self-pay

## 2022-03-09 ENCOUNTER — Other Ambulatory Visit (HOSPITAL_COMMUNITY): Payer: Self-pay

## 2022-03-25 ENCOUNTER — Other Ambulatory Visit (HOSPITAL_COMMUNITY): Payer: Self-pay

## 2022-04-09 ENCOUNTER — Other Ambulatory Visit: Payer: Self-pay | Admitting: Family Medicine

## 2022-04-09 ENCOUNTER — Other Ambulatory Visit (HOSPITAL_COMMUNITY): Payer: Self-pay

## 2022-04-09 MED ORDER — EMPAGLIFLOZIN 10 MG PO TABS
10.0000 mg | ORAL_TABLET | Freq: Every day | ORAL | 2 refills | Status: DC
  Filled 2022-04-09: qty 90, 90d supply, fill #0
  Filled 2022-07-26: qty 90, 90d supply, fill #1
  Filled 2022-10-21: qty 90, 90d supply, fill #2

## 2022-04-14 ENCOUNTER — Encounter: Payer: No Typology Code available for payment source | Admitting: Nurse Practitioner

## 2022-04-14 NOTE — Progress Notes (Signed)
Kelly Savage, Based on your symptoms we would recommend you get seen in person for assessment and testing.   I feel your condition warrants further evaluation and I recommend that you be seen in a face to face visit.  You can start  by calling your primary care, if they are unable to see you in person today I would recommend one of the following Urgent Care Facilities    NOTE: There will be NO CHARGE for this eVisit   If you are having a true medical emergency please call 911.      For an urgent face to face visit, [facility] has seven urgent care centers for your convenience:     [facility] Health Urgent Care Center at [facility] Directions [phone] [address] [place], [place] [address]    [facility] Health Urgent Care Center [facility]) Get Driving Directions [phone] [address] [place], [place] [address]  [facility] Health Urgent Care Center [facility]) Get Driving Directions [phone] [address] [place],  [place]  [address]  [facility] Health Urgent Care Center [facility] - at [employer] Directions  [phone] [place],  [place] [address]   [facility] Health Urgent Care at [facility] Get Driving Directions [phone] [address] [place], [place] [address]   [facility] Health Urgent Care at [facility] Get Driving Directions  [phone] [address] [place], [place] [address]   [facility] Health Urgent Care at [facility] Directions [phone] [address], [address] [place], [place] [address]  Your MyChart E-visit questionnaire answers were reviewed by a board certified advanced clinical practitioner to complete your personal care plan based on your specific symptoms.  Thank you for using e-Visits.

## 2022-04-15 ENCOUNTER — Encounter: Payer: Self-pay | Admitting: Family Medicine

## 2022-04-15 ENCOUNTER — Ambulatory Visit (INDEPENDENT_AMBULATORY_CARE_PROVIDER_SITE_OTHER): Payer: No Typology Code available for payment source | Admitting: Family Medicine

## 2022-04-15 ENCOUNTER — Other Ambulatory Visit (HOSPITAL_COMMUNITY): Payer: Self-pay

## 2022-04-15 VITALS — BP 126/73 | HR 67 | Ht 68.0 in | Wt 197.0 lb

## 2022-04-15 DIAGNOSIS — N3090 Cystitis, unspecified without hematuria: Secondary | ICD-10-CM

## 2022-04-15 DIAGNOSIS — I10 Essential (primary) hypertension: Secondary | ICD-10-CM | POA: Diagnosis not present

## 2022-04-15 LAB — POCT URINALYSIS DIP (CLINITEK)
Bilirubin, UA: NEGATIVE
Glucose, UA: >=1000 mg/dL — AB
Ketones, POC UA: NEGATIVE mg/dL
Leukocytes, UA: NEGATIVE
Nitrite, UA: NEGATIVE
POC PROTEIN,UA: NEGATIVE
Spec Grav, UA: 1.02 (ref 1.010–1.025)
Urobilinogen, UA: 1 E.U./dL
pH, UA: 5.5 (ref 5.0–8.0)

## 2022-04-15 MED ORDER — SULFAMETHOXAZOLE-TRIMETHOPRIM 800-160 MG PO TABS
1.0000 | ORAL_TABLET | Freq: Two times a day (BID) | ORAL | 0 refills | Status: AC
  Filled 2022-04-15: qty 6, 3d supply, fill #0

## 2022-04-15 NOTE — Patient Instructions (Signed)
Follow-up as before, call if you need me sooner.  You are treated for an acute bladder infection.  3-day course of Septra is sent to your pharmacy as we discussed.  Please ensure you drink at least 64 ounces of water daily and empty often.  Thanks for choosing [facility], we consider it a privelige to serve you.  Best for 2024!

## 2022-04-18 DIAGNOSIS — N3090 Cystitis, unspecified without hematuria: Secondary | ICD-10-CM | POA: Insufficient documentation

## 2022-04-18 LAB — URINE CULTURE

## 2022-04-18 NOTE — Progress Notes (Signed)
   Kelly Savage     MRN: [id number]      DOB: 26-Oct-1955   HPI Kelly Savage is here with a 2 day h/o lower abdominal pain and urinary frequency, denies flank pain , fever, nausea or chills   ROS Denies recent fever or chills. Denies sinus pressure, nasal congestion, ear pain or sore throat. Denies chest congestion, productive cough or wheezing.    PE  BP 126/73 (BP Location: Right Arm, Patient Position: Sitting, Cuff Size: Normal)   Pulse 67   Ht 5\' 8"  (1.727 m)   Wt 197 lb (89.4 kg)   SpO2 97%   BMI 29.95 kg/m   Patient alert and oriented and in no cardiopulmonary distress.  HEENT: No facial asymmetry, EOMI,     Neck supple .  Chest: Clear to auscultation bilaterally.  CVS: S1, S2 no murmurs, no S3.Regular rate.  ABD: Soft , no renal angle tenderness  Ext: No edema  MS: Adequate ROM spine, shoulders, hips and knees.  Skin: Intact, no ulcerations or rash noted.  Psych: Good eye contact, normal affect. Memory intact not anxious or depressed appearing.  CNS: CN 2-12 intact, power,  normal throughout.no focal deficits noted.   Assessment & Plan  Cystitis Symptomatic with abn CCUA, treat presumptively and f/u c/s  Essential hypertension Controlled, no change in medication

## 2022-04-18 NOTE — Assessment & Plan Note (Signed)
Symptomatic with abn CCUA, treat presumptively and f/u c/s

## 2022-04-18 NOTE — Assessment & Plan Note (Signed)
Controlled, no change in medication  

## 2022-04-29 ENCOUNTER — Other Ambulatory Visit (HOSPITAL_COMMUNITY): Payer: Self-pay

## 2022-05-22 ENCOUNTER — Ambulatory Visit: Payer: No Typology Code available for payment source | Admitting: Family Medicine

## 2022-05-27 ENCOUNTER — Other Ambulatory Visit: Payer: Self-pay

## 2022-05-27 ENCOUNTER — Encounter (HOSPITAL_COMMUNITY): Payer: Self-pay

## 2022-05-27 ENCOUNTER — Inpatient Hospital Stay (HOSPITAL_COMMUNITY)
Admission: EM | Admit: 2022-05-27 | Discharge: 2022-05-29 | DRG: 391 | Disposition: A | Discharge status: Home / Self Care | Payer: 59 | Source: Ambulatory Visit | Attending: Internal Medicine | Admitting: Internal Medicine

## 2022-05-27 ENCOUNTER — Ambulatory Visit (INDEPENDENT_AMBULATORY_CARE_PROVIDER_SITE_OTHER): Payer: 59 | Admitting: Family Medicine

## 2022-05-27 ENCOUNTER — Other Ambulatory Visit (HOSPITAL_COMMUNITY)
Admission: RE | Admit: 2022-05-27 | Discharge: 2022-05-27 | Disposition: A | Discharge status: Home / Self Care | Payer: 59 | Source: Ambulatory Visit | Attending: Family Medicine | Admitting: Family Medicine

## 2022-05-27 ENCOUNTER — Inpatient Hospital Stay (HOSPITAL_COMMUNITY)
Admission: RE | Admit: 2022-05-27 | Discharge: 2022-05-27 | Disposition: A | Discharge status: Home / Self Care | Payer: 59 | Source: Ambulatory Visit | Attending: Family Medicine | Admitting: Family Medicine

## 2022-05-27 ENCOUNTER — Encounter: Payer: Self-pay | Admitting: Family Medicine

## 2022-05-27 VITALS — BP 120/77 | HR 105 | Ht 68.0 in | Wt 197.1 lb

## 2022-05-27 DIAGNOSIS — Z833 Family history of diabetes mellitus: Secondary | ICD-10-CM

## 2022-05-27 DIAGNOSIS — Z794 Long term (current) use of insulin: Secondary | ICD-10-CM | POA: Diagnosis not present

## 2022-05-27 DIAGNOSIS — E785 Hyperlipidemia, unspecified: Secondary | ICD-10-CM | POA: Diagnosis present

## 2022-05-27 DIAGNOSIS — Z7984 Long term (current) use of oral hypoglycemic drugs: Secondary | ICD-10-CM | POA: Diagnosis not present

## 2022-05-27 DIAGNOSIS — E876 Hypokalemia: Secondary | ICD-10-CM | POA: Diagnosis present

## 2022-05-27 DIAGNOSIS — R109 Unspecified abdominal pain: Secondary | ICD-10-CM | POA: Diagnosis present

## 2022-05-27 DIAGNOSIS — E8809 Other disorders of plasma-protein metabolism, not elsewhere classified: Secondary | ICD-10-CM | POA: Diagnosis present

## 2022-05-27 DIAGNOSIS — B999 Unspecified infectious disease: Secondary | ICD-10-CM

## 2022-05-27 DIAGNOSIS — E1169 Type 2 diabetes mellitus with other specified complication: Secondary | ICD-10-CM | POA: Diagnosis present

## 2022-05-27 DIAGNOSIS — D649 Anemia, unspecified: Secondary | ICD-10-CM | POA: Diagnosis not present

## 2022-05-27 DIAGNOSIS — E46 Unspecified protein-calorie malnutrition: Secondary | ICD-10-CM | POA: Diagnosis not present

## 2022-05-27 DIAGNOSIS — E1159 Type 2 diabetes mellitus with other circulatory complications: Secondary | ICD-10-CM

## 2022-05-27 DIAGNOSIS — Z8249 Family history of ischemic heart disease and other diseases of the circulatory system: Secondary | ICD-10-CM | POA: Diagnosis not present

## 2022-05-27 DIAGNOSIS — N200 Calculus of kidney: Secondary | ICD-10-CM | POA: Diagnosis not present

## 2022-05-27 DIAGNOSIS — R1084 Generalized abdominal pain: Secondary | ICD-10-CM | POA: Diagnosis not present

## 2022-05-27 DIAGNOSIS — Z96652 Presence of left artificial knee joint: Secondary | ICD-10-CM | POA: Diagnosis present

## 2022-05-27 DIAGNOSIS — E669 Obesity, unspecified: Secondary | ICD-10-CM | POA: Diagnosis not present

## 2022-05-27 DIAGNOSIS — Z79899 Other long term (current) drug therapy: Secondary | ICD-10-CM

## 2022-05-27 DIAGNOSIS — R519 Headache, unspecified: Secondary | ICD-10-CM | POA: Diagnosis not present

## 2022-05-27 DIAGNOSIS — Z6830 Body mass index (BMI) 30.0-30.9, adult: Secondary | ICD-10-CM

## 2022-05-27 DIAGNOSIS — R1033 Periumbilical pain: Secondary | ICD-10-CM | POA: Diagnosis not present

## 2022-05-27 DIAGNOSIS — Z83438 Family history of other disorder of lipoprotein metabolism and other lipidemia: Secondary | ICD-10-CM

## 2022-05-27 DIAGNOSIS — R935 Abnormal findings on diagnostic imaging of other abdominal regions, including retroperitoneum: Secondary | ICD-10-CM | POA: Diagnosis not present

## 2022-05-27 DIAGNOSIS — R81 Glycosuria: Secondary | ICD-10-CM | POA: Diagnosis not present

## 2022-05-27 DIAGNOSIS — A09 Infectious gastroenteritis and colitis, unspecified: Principal | ICD-10-CM | POA: Diagnosis present

## 2022-05-27 DIAGNOSIS — K651 Peritoneal abscess: Secondary | ICD-10-CM | POA: Diagnosis not present

## 2022-05-27 DIAGNOSIS — I10 Essential (primary) hypertension: Secondary | ICD-10-CM | POA: Diagnosis present

## 2022-05-27 DIAGNOSIS — E782 Mixed hyperlipidemia: Secondary | ICD-10-CM | POA: Diagnosis not present

## 2022-05-27 DIAGNOSIS — Z823 Family history of stroke: Secondary | ICD-10-CM

## 2022-05-27 DIAGNOSIS — R1030 Lower abdominal pain, unspecified: Secondary | ICD-10-CM | POA: Diagnosis not present

## 2022-05-27 DIAGNOSIS — Z9049 Acquired absence of other specified parts of digestive tract: Secondary | ICD-10-CM | POA: Diagnosis not present

## 2022-05-27 DIAGNOSIS — Z800 Family history of malignant neoplasm of digestive organs: Secondary | ICD-10-CM

## 2022-05-27 LAB — CBC WITH DIFFERENTIAL/PLATELET
Abs Immature Granulocytes: 0.04 10*3/uL (ref 0.00–0.07)
Abs Immature Granulocytes: 0.03 10*3/uL (ref 0.00–0.07)
Basophils Absolute: 0.1 10*3/uL (ref 0.0–0.1)
Basophils Absolute: 0 10*3/uL (ref 0.0–0.1)
Basophils Relative: 1 %
Basophils Relative: 0 %
Eosinophils Absolute: 0.1 10*3/uL (ref 0.0–0.5)
Eosinophils Absolute: 0.1 10*3/uL (ref 0.0–0.5)
Eosinophils Relative: 1 %
Eosinophils Relative: 1 %
HCT: 36.9 % (ref 36.0–46.0)
HCT: 34.4 % — ABNORMAL LOW (ref 36.0–46.0)
Hemoglobin: 12.3 g/dL (ref 12.0–15.0)
Hemoglobin: 11.3 g/dL — ABNORMAL LOW (ref 12.0–15.0)
Immature Granulocytes: 0 %
Immature Granulocytes: 0 %
Lymphocytes Relative: 13 %
Lymphocytes Relative: 8 %
Lymphs Abs: 1.2 10*3/uL (ref 0.7–4.0)
Lymphs Abs: 0.8 10*3/uL (ref 0.7–4.0)
MCH: 29.4 pg (ref 26.0–34.0)
MCH: 29 pg (ref 26.0–34.0)
MCHC: 33.3 g/dL (ref 30.0–36.0)
MCHC: 32.8 g/dL (ref 30.0–36.0)
MCV: 88.3 fL (ref 80.0–100.0)
MCV: 88.2 fL (ref 80.0–100.0)
Monocytes Absolute: 0.8 10*3/uL (ref 0.1–1.0)
Monocytes Absolute: 0.6 10*3/uL (ref 0.1–1.0)
Monocytes Relative: 8 %
Monocytes Relative: 6 %
Neutro Abs: 7.6 10*3/uL (ref 1.7–7.7)
Neutro Abs: 8.3 10*3/uL — ABNORMAL HIGH (ref 1.7–7.7)
Neutrophils Relative %: 77 %
Neutrophils Relative %: 85 %
Platelets: 296 10*3/uL (ref 150–400)
Platelets: 254 10*3/uL (ref 150–400)
RBC: 4.18 MIL/uL (ref 3.87–5.11)
RBC: 3.9 MIL/uL (ref 3.87–5.11)
RDW: 14.1 % (ref 11.5–15.5)
RDW: 14.1 % (ref 11.5–15.5)
WBC: 9.8 10*3/uL (ref 4.0–10.5)
WBC: 9.8 10*3/uL (ref 4.0–10.5)
nRBC: 0 % (ref 0.0–0.2)
nRBC: 0 % (ref 0.0–0.2)

## 2022-05-27 LAB — COMPREHENSIVE METABOLIC PANEL
ALT: 16 U/L (ref 0–44)
AST: 16 U/L (ref 15–41)
Albumin: 3.1 g/dL — ABNORMAL LOW (ref 3.5–5.0)
Alkaline Phosphatase: 61 U/L (ref 38–126)
Anion gap: 14 (ref 5–15)
BUN: 11 mg/dL (ref 8–23)
CO2: 23 mmol/L (ref 22–32)
Calcium: 8.8 mg/dL — ABNORMAL LOW (ref 8.9–10.3)
Chloride: 95 mmol/L — ABNORMAL LOW (ref 98–111)
Creatinine, Ser: 0.68 mg/dL (ref 0.44–1.00)
GFR, Estimated: >60 mL/min (ref >60)
Glucose, Bld: 117 mg/dL — ABNORMAL HIGH (ref 70–99)
Potassium: 3.6 mmol/L (ref 3.5–5.1)
Sodium: 132 mmol/L — ABNORMAL LOW (ref 135–145)
Total Bilirubin: 1.4 mg/dL — ABNORMAL HIGH (ref 0.3–1.2)
Total Protein: 7.5 g/dL (ref 6.5–8.1)

## 2022-05-27 LAB — URINALYSIS, ROUTINE W REFLEX MICROSCOPIC
Bilirubin Urine: NEGATIVE
Glucose, UA: >=500 mg/dL — AB
Ketones, ur: 20 mg/dL — AB
Leukocytes,Ua: NEGATIVE
Nitrite: NEGATIVE
Protein, ur: NEGATIVE mg/dL
Specific Gravity, Urine: 1.032 — ABNORMAL HIGH (ref 1.005–1.030)
pH: 5 (ref 5.0–8.0)

## 2022-05-27 LAB — BASIC METABOLIC PANEL
Anion gap: 13 (ref 5–15)
BUN: 11 mg/dL (ref 8–23)
CO2: 25 mmol/L (ref 22–32)
Calcium: 9 mg/dL (ref 8.9–10.3)
Chloride: 96 mmol/L — ABNORMAL LOW (ref 98–111)
Creatinine, Ser: 0.69 mg/dL (ref 0.44–1.00)
GFR, Estimated: >60 mL/min (ref >60)
Glucose, Bld: 107 mg/dL — ABNORMAL HIGH (ref 70–99)
Potassium: 3.3 mmol/L — ABNORMAL LOW (ref 3.5–5.1)
Sodium: 134 mmol/L — ABNORMAL LOW (ref 135–145)

## 2022-05-27 LAB — PROTIME-INR
INR: 1.3 — ABNORMAL HIGH (ref 0.8–1.2)
Prothrombin Time: 15.6 seconds — ABNORMAL HIGH (ref 11.4–15.2)

## 2022-05-27 LAB — LIPASE, BLOOD: Lipase: 30 U/L (ref 11–51)

## 2022-05-27 LAB — LACTIC ACID, PLASMA: Lactic Acid, Venous: 0.7 mmol/L (ref 0.5–1.9)

## 2022-05-27 MED ORDER — IOHEXOL 300 MG/ML  SOLN
100.0000 mL | Freq: Once | INTRAMUSCULAR | Status: AC | PRN
  Administered 2022-05-27: 100 mL via INTRAVENOUS

## 2022-05-27 MED ORDER — ONDANSETRON HCL 4 MG/2ML IJ SOLN: 4.0000 mg | Freq: Four times a day (QID) | INTRAMUSCULAR | Status: DC | PRN

## 2022-05-27 MED ORDER — LACTATED RINGERS IV BOLUS (SEPSIS)
500.0000 mL | Freq: Once | INTRAVENOUS | Status: AC
  Administered 2022-05-27: 500 mL via INTRAVENOUS

## 2022-05-27 MED ORDER — LACTATED RINGERS IV SOLN: INTRAVENOUS | Status: DC

## 2022-05-27 MED ORDER — PIPERACILLIN-TAZOBACTAM 3.375 G IVPB 30 MIN
3.3750 g | Freq: Once | INTRAVENOUS | Status: AC
  Administered 2022-05-27: 3.375 g via INTRAVENOUS
  Filled 2022-05-27: qty 50

## 2022-05-27 MED ORDER — FENTANYL CITRATE PF 50 MCG/ML IJ SOSY: 50.0000 ug | PREFILLED_SYRINGE | INTRAMUSCULAR | Status: DC | PRN

## 2022-05-27 MED ORDER — ONDANSETRON HCL 4 MG/2ML IJ SOLN
4.0000 mg | Freq: Once | INTRAMUSCULAR | Status: AC
  Administered 2022-05-27: 4 mg via INTRAVENOUS
  Filled 2022-05-27: qty 2

## 2022-05-27 NOTE — H&P (Signed)
History and Physical    Patient: Kelly Savage LSL:[id number] DOB: [birth year] DOA: 05/27/2022 DOS: the patient was seen and examined on 05/28/2022 PCP: Kerri Perches, MD  Patient coming from: Home  Chief Complaint:  Chief Complaint  Patient presents with   Abdominal Pain   HPI: Kelly Savage is a 67 y.o. female with medical history significant of hypertension, T2DM, hyperlipidemia who presents to the emergency department due to 4-day onset of subjective fever and chills and 2-day onset of generalized abdominal soreness with intermittent nausea without vomiting.  Patient went to see her PCP this morning, CT scan was done which showed  a thickened inflamed segment of small bowel within the mid abdomen with an adjacent complex inflammatory lesion possibly representing early abscess formation and she was asked to go to the ED for further evaluation and management.   Last bowel movement was this morning and it was normal. She was admitted from 5/25 to 5/31 due to incarcerated small bowel with a partial small bowel obstruction which was treated with partial small bowel resection and ventral herniorrhaphy on 09/26/2021 by Dr. Lovell Sheehan.  ED Course:  In the emergency department, she was hemodynamically stable.  Workup in the ED showed normal CBC except for normocytic anemia, BMP showed sodium 134, potassium 3.4, chloride 95, bicarb 23, blood glucose 117, BUN 11, creatinine 0.68, albumin 3.1.  Lactic acid 0.7, lipase 30, sed rate 73, urinalysis was positive for glycosuria.  Blood culture pending CT abdomen and pelvis with contrast showed: 1. Markedly thickened and inflamed segment of small bowel within the mid abdomen, as described above, with an adjacent 4.5 cm x 2.9 cm x 3.3 cm complex inflammatory lesion likely representing early abscess formation. 2. Short segment of moderately thickened and inflamed mid transverse colon just above this inflammatory process. 3. 2 mm nonobstructing left renal  calculus. 4. Evidence of prior cholecystectomy with mild central intrahepatic biliary dilatation and dilatation of the common bile duct. Correlation with liver enzymes is recommended. She was treated with IV fentanyl, Zofran.  IV hydration was provided and patient was empirically started on IV Zosyn.  General surgery Dr. Henreitta Leber was consulted and recommended admitting patient to start patient on IV antibiotics with plan to follow-up on patient in the morning.  Hospitalist was asked admit patient for further evaluation and management.   Review of Systems: Review of systems as noted in the HPI. All other systems reviewed and are negative.   Past Medical History:  Diagnosis Date   Ankle pain x3 months   bilateral   Diabetes mellitus type II 2006   Diabetes mellitus without complication (HCC)    [employer]rees[employer] 07/11/2020   Obesity all her life    lost 28 lbs in 14 weeks    Primary localized osteoarthritis of knee    Left   Primary localized osteoarthritis of left knee 09/23/2017   Right thigh pain    Thigh pain x 1 year    bilateral, esp when laying down, worse in the morning , and goes away with activity   Past Surgical History:  Procedure Laterality Date   APPENDECTOMY  2000   BOWEL RESECTION N/A 09/26/2021   Procedure: PARTIAL SMALL BOWEL RESECTION;  Surgeon: Franky Macho, MD;  Location: [facility];  Service: General;  Laterality: N/A;   Cholecysectomy  1998   CHOLECYSTECTOMY     INCISIONAL HERNIA REPAIR N/A 09/26/2021   Procedure: HERNIORRHAPHY INCISIONAL;  Surgeon: Franky Macho, MD;  Location: [facility];  Service: General;  Laterality: N/A;   TONSILLECTOMY     in childhood   TOTAL KNEE ARTHROPLASTY Left 10/18/2017   TOTAL KNEE ARTHROPLASTY Left 10/18/2017   Procedure: TOTAL KNEE ARTHROPLASTY;  Surgeon: Elsie Saas, MD;  Location: [facility];  Service: Orthopedics;  Laterality: Left;    Social History:  reports that she has never smoked. She has never used smokeless tobacco. She reports that  she does not currently use alcohol. She reports that she does not use drugs.   No Known Allergies  Family History  Problem Relation Age of Onset   Heart failure Mother 10       CVA (secondary to afib    Hyperlipidemia Father    Hypertension Father    Cancer Maternal Grandfather        colon    Diabetes Maternal Grandmother      Prior to Admission medications   Medication Sig Start Date End Date Taking? Authorizing Provider  aspirin EC 81 MG tablet Take 1 tablet (81 mg total) by mouth daily. 10/06/21   Shahmehdi, Valeria Batman, MD  benazepril (LOTENSIN) 5 MG tablet Take 1 tablet (5 mg total) by mouth daily. 01/26/22   Fayrene Helper, MD  blood glucose meter kit and supplies Dispense based on patient and insurance preference. Three times daily testing dx e11.65 01/31/19   Fayrene Helper, MD  calcium carbonate (OSCAL) 1500 (600 Ca) MG TABS tablet Take 1,200 mg by mouth every evening.    [provider]  diclofenac (VOLTAREN) 75 MG EC tablet Take 1 tablet (75 mg total) by mouth daily as needed for arthritic pain 01/26/22   Fayrene Helper, MD  empagliflozin (JARDIANCE) 10 MG TABS tablet Take 1 tablet (10 mg total) by mouth daily before breakfast. 04/09/22   Fayrene Helper, MD  glipiZIDE (GLUCOTROL XL) 10 MG 24 hr tablet Take 2 tablets (20 mg total) by mouth in the morning at breakfast 01/26/22   Fayrene Helper, MD  glucose blood (FREESTYLE LITE) test strip Use as instructed to test blood sugar once daily. 10/14/21   Fayrene Helper, MD  Lancets (FREESTYLE) lancets Use as instructed to test blood sugar once daily. 10/10/20   Fayrene Helper, MD  rosuvastatin (CRESTOR) 20 MG tablet Take 1 tablet (20 mg total) by mouth daily. 07/04/21   Fayrene Helper, MD  SitaGLIPtin-MetFORMIN HCl (JANUMET XR) 50-1000 MG TB24 Take 1 tablet by mouth 2 (two) times daily. 01/28/22     metFORMIN (GLUCOPHAGE) 500 MG tablet Take 2 tablets (1,000 mg total) by mouth 2 (two) times daily for  blood sugar. 12/05/21 02/24/22  Fayrene Helper, MD  SEMGLEE 100 UNIT/ML injection Inject 0.35 mLs (35 Units total) into the skin daily. 08/22/20 08/23/20  Fayrene Helper, MD    Physical Exam: BP 102/74 (BP Location: Left Arm)   Pulse 90   Temp 98.1 F (36.7 C) (Oral)   Resp 18   Ht 5\' 8"  (1.727 m)   Wt 90.1 kg   SpO2 100%   BMI 30.20 kg/m   General: 67 y.o. year-old female well developed well nourished in no acute distress.  Alert and oriented x3. HEENT: NCAT, EOMI Neck: Supple, trachea medial Cardiovascular: Regular rate and rhythm with no rubs or gallops.  No thyromegaly or JVD noted.  No lower extremity edema. 2/4 pulses in all 4 extremities. Respiratory: Clear to auscultation with no wheezes or rales. Good inspiratory effort. Abdomen: Soft, tender to palpation without guarding. Muskuloskeletal: No cyanosis, clubbing  or edema noted bilaterally Neuro: CN II-XII intact, strength 5/5 x 4, sensation, reflexes intact Skin: No ulcerative lesions noted or rashes Psychiatry: Judgement and insight appear normal. Mood is appropriate for condition and setting          Labs on Admission:  Basic Metabolic Panel: Recent Labs  Lab 05/27/22 1548 05/27/22 2217  NA 134* 132*  K 3.3* 3.6  CL 96* 95*  CO2 25 23  GLUCOSE 107* 117*  BUN 11 11  CREATININE 0.69 0.68  CALCIUM 9.0 8.8*   Liver Function Tests: Recent Labs  Lab 05/27/22 2217  AST 16  ALT 16  ALKPHOS 61  BILITOT 1.4*  PROT 7.5  ALBUMIN 3.1*   Recent Labs  Lab 05/27/22 2217  LIPASE 30   No results for input(s): "AMMONIA" in the last 168 hours. CBC: Recent Labs  Lab 05/27/22 1548 05/27/22 2217  WBC 9.8 9.8  NEUTROABS 7.6 8.3*  HGB 12.3 11.3*  HCT 36.9 34.4*  MCV 88.3 88.2  PLT 296 254   Cardiac Enzymes: No results for input(s): "CKTOTAL", "CKMB", "CKMBINDEX", "TROPONINI" in the last 168 hours.  BNP (last 3 results) No results for input(s): "BNP" in the last 8760 hours.  ProBNP (last 3  results) No results for input(s): "PROBNP" in the last 8760 hours.  CBG: No results for input(s): "GLUCAP" in the last 168 hours.  Radiological Exams on Admission: CT Abdomen Pelvis W Contrast  Result Date: 05/27/2022 CLINICAL DATA:  Lower abdominal pain with chills and fatigue. EXAM: CT ABDOMEN AND PELVIS WITH CONTRAST TECHNIQUE: Multidetector CT imaging of the abdomen and pelvis was performed using the standard protocol following bolus administration of intravenous contrast. RADIATION DOSE REDUCTION: This exam was performed according to the departmental dose-optimization program which includes automated exposure control, adjustment of the mA and/or kV according to patient size and/or use of iterative reconstruction technique. CONTRAST:  183mL OMNIPAQUE IOHEXOL 300 MG/ML  SOLN COMPARISON:  Sep 25, 2021 FINDINGS: Lower chest: No acute abnormality. Hepatobiliary: No focal liver abnormality is seen. Mild central intrahepatic biliary dilatation is noted. Status post cholecystectomy. The common bile duct is dilated and measures 11 mm in diameter. Pancreas: Unremarkable. No pancreatic ductal dilatation or surrounding inflammatory changes. Spleen: Normal in size without focal abnormality. Adrenals/Urinary Tract: Adrenal glands are unremarkable. Kidneys are normal, without obstructing renal calculi, focal lesion, or hydronephrosis. A 2 mm nonobstructing renal calculus is seen within the mid left kidney. Bladder is unremarkable. Stomach/Bowel: Stomach is within normal limits. The appendix is not visualized. No evidence of bowel dilatation. A 4.5 cm long, markedly thickened and inflamed segment of small bowel is seen within the anterior aspect of the mid abdomen. This is at the level the umbilicus and extends into a 2.9 cm x 1.1 cm left-sided para umbilical hernia. A 4.5 cm x 2.9 cm x 3.3 cm complex area of low attenuation (approximately 35.07 Hounsfield units), with thick, heterogeneous surrounding hyperdense wall,  is seen just above the segment of inflamed small bowel. Marked severity surrounding inflammatory fat stranding is seen. A short segment of moderately thickened and inflamed mid transverse colon is seen just above this inflammatory process (best seen on sagittal reformatted images 63 through 74, CT series 6). Vascular/Lymphatic: No significant vascular findings are present. No enlarged abdominal or pelvic lymph nodes. Reproductive: Uterus and bilateral adnexa are unremarkable. Other: No abdominal wall hernia or abnormality. No abdominopelvic ascites. Musculoskeletal: Multilevel degenerative changes seen throughout the lumbar spine. IMPRESSION: 1. Markedly thickened and inflamed segment of  small bowel within the mid abdomen, as described above, with an adjacent 4.5 cm x 2.9 cm x 3.3 cm complex inflammatory lesion likely representing early abscess formation. 2. Short segment of moderately thickened and inflamed mid transverse colon just above this inflammatory process. 3. 2 mm nonobstructing left renal calculus. 4. Evidence of prior cholecystectomy with mild central intrahepatic biliary dilatation and dilatation of the common bile duct. Correlation with liver enzymes is recommended. Electronically Signed   By: Aram Candela M.D.   On: 05/27/2022 19:06    EKG: I independently viewed the EKG done and my findings are as followed: Normal sinus rhythm at a rate of 83 bpm with RBBB and LAFB  Assessment/Plan Present on Admission:  Abdominal pain  Hypokalemia  Essential hypertension  Hyperlipidemia with target LDL less than 100  Type 2 diabetes mellitus with vascular disease (HCC)  Principal Problem:   Abdominal pain Active Problems:   Hypokalemia   Type 2 diabetes mellitus with vascular disease (HCC)   Hyperlipidemia with target LDL less than 100   Essential hypertension   Hypoalbuminemia due to protein-calorie malnutrition (HCC)  Abdominal pain CT abdomen pelvis with contrast was suggestive of   thickened inflamed segment of small bowel with a  complex inflammatory lesion likely representing early abscess formation. Patient will be admitted to [facility] Continue IV hydration Continue empiric IV Zosyn  Continue IV fentanyl 25 mcg q.2h p.r.n. for moderate to severe pain Continue IV Zofran p.r.n. Continue n.p.o. pending being cleared by surgery Blood culture pending General surgery was consulted and will follow up with patient in the morning  Hypokalemia K+ 3.4, this will be replenished  Hypoalbuminemia possibly secondary to high-protein calorie malnutrition Albumin 3.1, consider starting patient on protein supplement when patient resumes oral intake  Essential hypertension BP meds will be held at this time due to soft BP  Mixed hyperlipidemia Consider starting patient's home statin when she resumes oral intake  T2DM Continue ISS and hypoglycemia protocol Janumet, glipizide and Jardiance will be temporarily held at this time  Obesity (BMI 30.20) Continue diet and lifestyle medication  DVT prophylaxis: SCDs (consider starting chemoprophylaxis if patient does not require any surgical intervention)  Code Status: Full code  Consults: General surgery  Severity of Illness: The appropriate patient status for this patient is INPATIENT. Inpatient status is judged to be reasonable and necessary in order to provide the required intensity of service to ensure the patient's safety. The patient's presenting symptoms, physical exam findings, and initial radiographic and laboratory data in the context of their chronic comorbidities is felt to place them at high risk for further clinical deterioration. Furthermore, it is not anticipated that the patient will be medically stable for discharge from the hospital within 2 midnights of admission.   * I certify that at the point of admission it is my clinical judgment that the patient will require inpatient hospital care spanning beyond 2 midnights  from the point of admission due to high intensity of service, high risk for further deterioration and high frequency of surveillance required.*  Author: Frankey Shown, DO 05/28/2022 2:23 AM  For on call review [email or web address].

## 2022-05-27 NOTE — ED Triage Notes (Signed)
Pt presents to ED with c/o abd pain, pt saw Dr Moshe Cipro today and CT abd was done, results show abscess, was told by PCP that she needed to come to ED for IV anbx.

## 2022-05-27 NOTE — Patient Instructions (Signed)
I have ordered abdominal scan, and hopefully you will get this today  CBC and diff, chem 7 and EGFR stat at [facility] lab  Depending on results you may be able to be treated at home or need hospitalization, I will speak with you after the test

## 2022-05-27 NOTE — ED Provider Notes (Signed)
[facility] Provider Note   CSN: [id number] Arrival date & time: 05/27/22  2054     History  Chief Complaint  Patient presents with   Abdominal Pain    Kelly Savage is a 67 y.o. female.   Abdominal Pain Associated symptoms: chills, fever and nausea   Patient presents for abdominal pain.  Medical history includes arthritis, HTN, DM.  Over the last 3 to 4 days, patient has had chills and subjective fevers.  She developed a generalized abdominal pain.  She has had intermittent mild nausea without vomiting.  She has been able to tolerate p.o. intake.  She had 2 bowel movements today.  She was seen by PCP earlier today.  She underwent outpatient CT scan earlier today which showed a thickened inflamed segment of small bowel within the mid abdomen with an adjacent complex inflammatory lesion possibly representing early abscess formation.  She was advised to come to the ED.  Currently, pain is 2-3/10 in severity.  She denies any current nausea.  Pain is maximal in the left upper quadrant.  She does have history of multiple prior abdominal surgeries.     Home Medications Prior to Admission medications   Medication Sig Start Date End Date Taking? Authorizing Provider  aspirin EC 81 MG tablet Take 1 tablet (81 mg total) by mouth daily. 10/06/21   Shahmehdi, Valeria Batman, MD  benazepril (LOTENSIN) 5 MG tablet Take 1 tablet (5 mg total) by mouth daily. 01/26/22   Fayrene Helper, MD  blood glucose meter kit and supplies Dispense based on patient and insurance preference. Three times daily testing dx e11.65 01/31/19   Fayrene Helper, MD  calcium carbonate (OSCAL) 1500 (600 Ca) MG TABS tablet Take 1,200 mg by mouth every evening.    [provider]  diclofenac (VOLTAREN) 75 MG EC tablet Take 1 tablet (75 mg total) by mouth daily as needed for arthritic pain 01/26/22   Fayrene Helper, MD  empagliflozin (JARDIANCE) 10 MG TABS tablet Take 1  tablet (10 mg total) by mouth daily before breakfast. 04/09/22   Fayrene Helper, MD  glipiZIDE (GLUCOTROL XL) 10 MG 24 hr tablet Take 2 tablets (20 mg total) by mouth in the morning at breakfast 01/26/22   Fayrene Helper, MD  glucose blood (FREESTYLE LITE) test strip Use as instructed to test blood sugar once daily. 10/14/21   Fayrene Helper, MD  Lancets (FREESTYLE) lancets Use as instructed to test blood sugar once daily. 10/10/20   Fayrene Helper, MD  rosuvastatin (CRESTOR) 20 MG tablet Take 1 tablet (20 mg total) by mouth daily. 07/04/21   Fayrene Helper, MD  SitaGLIPtin-MetFORMIN HCl (JANUMET XR) 50-1000 MG TB24 Take 1 tablet by mouth 2 (two) times daily. 01/28/22     metFORMIN (GLUCOPHAGE) 500 MG tablet Take 2 tablets (1,000 mg total) by mouth 2 (two) times daily for blood sugar. 12/05/21 02/24/22  Fayrene Helper, MD  SEMGLEE 100 UNIT/ML injection Inject 0.35 mLs (35 Units total) into the skin daily. 08/22/20 08/23/20  Fayrene Helper, MD      Allergies    Patient has no known allergies.    Review of Systems   Review of Systems  Constitutional:  Positive for appetite change, chills and fever.  Gastrointestinal:  Positive for abdominal pain and nausea.  All other systems reviewed and are negative.   Physical Exam Updated Vital Signs BP 131/76 (BP Location: Right Arm)  Pulse 93   Temp 98.7 F (37.1 C) (Oral)   Resp 16   Ht 5\' 8"  (1.727 m)   Wt 89.4 kg   SpO2 97%   BMI 29.97 kg/m  Physical Exam Vitals and nursing note reviewed.  Constitutional:      General: She is not in acute distress.    Appearance: She is well-developed. She is not ill-appearing, toxic-appearing or diaphoretic.  HENT:     Head: Normocephalic and atraumatic.     Mouth/Throat:     Mouth: Mucous membranes are moist.  Eyes:     General: No scleral icterus.    Conjunctiva/sclera: Conjunctivae normal.     Pupils: Pupils are equal, round, and reactive to light.  Cardiovascular:      Rate and Rhythm: Normal rate and regular rhythm.     Heart sounds: No murmur heard. Pulmonary:     Effort: Pulmonary effort is normal. No respiratory distress.  Abdominal:     Palpations: Abdomen is soft.     Tenderness: There is generalized abdominal tenderness and tenderness in the left upper quadrant.  Musculoskeletal:        General: No swelling.     Cervical back: Neck supple.  Skin:    General: Skin is warm and dry.     Capillary Refill: Capillary refill takes less than 2 seconds.     Coloration: Skin is not cyanotic, jaundiced or pale.  Neurological:     General: No focal deficit present.     Mental Status: She is alert and oriented to person, place, and time.  Psychiatric:        Mood and Affect: Mood normal.        Behavior: Behavior normal.     ED Results / Procedures / Treatments   Labs (all labs ordered are listed, but only abnormal results are displayed) Labs Reviewed  CBC WITH DIFFERENTIAL/PLATELET - Abnormal; Notable for the following components:      Result Value   Hemoglobin 11.3 (*)    HCT 34.4 (*)    Neutro Abs 8.3 (*)    All other components within normal limits  CULTURE, BLOOD (ROUTINE X 2)  CULTURE, BLOOD (ROUTINE X 2)  LACTIC ACID, PLASMA  LACTIC ACID, PLASMA  COMPREHENSIVE METABOLIC PANEL  PROTIME-INR  URINALYSIS, ROUTINE W REFLEX MICROSCOPIC  LIPASE, BLOOD  SEDIMENTATION RATE  C-REACTIVE PROTEIN    EKG None  Radiology CT Abdomen Pelvis W Contrast  Result Date: 05/27/2022 CLINICAL DATA:  Lower abdominal pain with chills and fatigue. EXAM: CT ABDOMEN AND PELVIS WITH CONTRAST TECHNIQUE: Multidetector CT imaging of the abdomen and pelvis was performed using the standard protocol following bolus administration of intravenous contrast. RADIATION DOSE REDUCTION: This exam was performed according to the departmental dose-optimization program which includes automated exposure control, adjustment of the mA and/or kV according to patient size and/or  use of iterative reconstruction technique. CONTRAST:  157mL OMNIPAQUE IOHEXOL 300 MG/ML  SOLN COMPARISON:  Sep 25, 2021 FINDINGS: Lower chest: No acute abnormality. Hepatobiliary: No focal liver abnormality is seen. Mild central intrahepatic biliary dilatation is noted. Status post cholecystectomy. The common bile duct is dilated and measures 11 mm in diameter. Pancreas: Unremarkable. No pancreatic ductal dilatation or surrounding inflammatory changes. Spleen: Normal in size without focal abnormality. Adrenals/Urinary Tract: Adrenal glands are unremarkable. Kidneys are normal, without obstructing renal calculi, focal lesion, or hydronephrosis. A 2 mm nonobstructing renal calculus is seen within the mid left kidney. Bladder is unremarkable. Stomach/Bowel: Stomach is within normal  limits. The appendix is not visualized. No evidence of bowel dilatation. A 4.5 cm long, markedly thickened and inflamed segment of small bowel is seen within the anterior aspect of the mid abdomen. This is at the level the umbilicus and extends into a 2.9 cm x 1.1 cm left-sided para umbilical hernia. A 4.5 cm x 2.9 cm x 3.3 cm complex area of low attenuation (approximately 35.07 Hounsfield units), with thick, heterogeneous surrounding hyperdense wall, is seen just above the segment of inflamed small bowel. Marked severity surrounding inflammatory fat stranding is seen. A short segment of moderately thickened and inflamed mid transverse colon is seen just above this inflammatory process (best seen on sagittal reformatted images 63 through 74, CT series 6). Vascular/Lymphatic: No significant vascular findings are present. No enlarged abdominal or pelvic lymph nodes. Reproductive: Uterus and bilateral adnexa are unremarkable. Other: No abdominal wall hernia or abnormality. No abdominopelvic ascites. Musculoskeletal: Multilevel degenerative changes seen throughout the lumbar spine. IMPRESSION: 1. Markedly thickened and inflamed segment of small  bowel within the mid abdomen, as described above, with an adjacent 4.5 cm x 2.9 cm x 3.3 cm complex inflammatory lesion likely representing early abscess formation. 2. Short segment of moderately thickened and inflamed mid transverse colon just above this inflammatory process. 3. 2 mm nonobstructing left renal calculus. 4. Evidence of prior cholecystectomy with mild central intrahepatic biliary dilatation and dilatation of the common bile duct. Correlation with liver enzymes is recommended. Electronically Signed   By: Virgina Norfolk M.D.   On: 05/27/2022 19:06    Procedures Procedures    Medications Ordered in ED Medications  lactated ringers infusion ( Intravenous New Bag/Given 05/27/22 2211)  piperacillin-tazobactam (ZOSYN) IVPB 3.375 g (0 g Intravenous Stopped 05/27/22 2210)  lactated ringers bolus 500 mL (500 mLs Intravenous New Bag/Given 05/27/22 2212)    ED Course/ Medical Decision Making/ A&P                             Medical Decision Making Amount and/or Complexity of Data Reviewed Labs: ordered. ECG/medicine tests: ordered.  Risk Prescription drug management.   This patient presents to the ED for concern of abdominal pain, this involves an extensive number of treatment options, and is a complaint that carries with it a high risk of complications and morbidity.  The differential diagnosis includes enteritis, intra-abdominal abscess, new diagnosis of Crohn's, bowel obstruction   Co morbidities that complicate the patient evaluation  arthritis, HTN, DM, cholecystectomy, appendectomy, bowel resection last year   Additional history obtained:  Additional history obtained from patient's PCP External records from outside source obtained and reviewed including EMR   Lab Tests:  I reviewed lab work from PCPs office the pertinent results include: Normal hemoglobin, no leukocytosis, normal kidney function.   Imaging Studies ordered:  I reviewed CT scan from earlier  today I independently visualized and interpreted imaging which showed thickened and inflamed segment of small bowel with adjacent inflammatory lesion suggestive of early abscess formation I agree with the radiologist interpretation   Cardiac Monitoring: / EKG:  The patient was maintained on a cardiac monitor.  I personally viewed and interpreted the cardiac monitored which showed an underlying rhythm of: Sinus rhythm   Consultations Obtained:  I requested consultation with the general surgeon, Dr. Constance Haw,  and discussed lab and imaging findings as well as pertinent plan - they recommend: Admission to hospitalist, IV antibiotics, and she will see her in the morning.  Problem List / ED Course / Critical interventions / Medication management  Patient presenting after CT scan findings of small bowel abscess.  She reports that she has had systemic symptoms of fevers, chills, nausea over the past 3 to 4 days.  She developed abdominal pain that has been generalized.  She was sent to the ED by her PCP following CT scan findings.  On arrival in the ED, patient is afebrile with normal vital signs.  She is well-appearing on exam.  She does have tenderness to her abdomen that is generalized but worse in the left upper quadrant.  Current pain is 2-3/10 in severity.  She declines any current pain medications.  She denies any current nausea.  Patient was started on IV fluids and IV antibiotics.  I consulted general surgeon on-call, Dr. Henreitta Leber, who reviewed imaging.  She recommends IV antibiotics and admission to hospitalist.  She will see the patient in the morning and provide further recommendations. I ordered medication including Zosyn for concern for intra-abdominal abscess formation; IV fluids for hydration Reevaluation of the patient after these medicines showed that the patient stayed the same I have reviewed the patients home medicines and have made adjustments as needed   Social Determinants of  Health:  Has access to outpatient care         Final Clinical Impression(s) / ED Diagnoses Final diagnoses:  Generalized abdominal pain    Rx / DC Orders ED Discharge Orders     None         Gloris Manchester, MD 05/27/22 2229

## 2022-05-28 ENCOUNTER — Other Ambulatory Visit: Payer: Self-pay | Admitting: *Deleted

## 2022-05-28 DIAGNOSIS — E46 Unspecified protein-calorie malnutrition: Secondary | ICD-10-CM | POA: Insufficient documentation

## 2022-05-28 DIAGNOSIS — R1033 Periumbilical pain: Secondary | ICD-10-CM

## 2022-05-28 DIAGNOSIS — B999 Unspecified infectious disease: Secondary | ICD-10-CM

## 2022-05-28 DIAGNOSIS — K651 Peritoneal abscess: Secondary | ICD-10-CM

## 2022-05-28 LAB — PHOSPHORUS: Phosphorus: 3.7 mg/dL (ref 2.5–4.6)

## 2022-05-28 LAB — COMPREHENSIVE METABOLIC PANEL
ALT: 13 U/L (ref 0–44)
AST: 15 U/L (ref 15–41)
Albumin: 2.7 g/dL — ABNORMAL LOW (ref 3.5–5.0)
Alkaline Phosphatase: 54 U/L (ref 38–126)
Anion gap: 12 (ref 5–15)
BUN: 9 mg/dL (ref 8–23)
CO2: 22 mmol/L (ref 22–32)
Calcium: 8.3 mg/dL — ABNORMAL LOW (ref 8.9–10.3)
Chloride: 99 mmol/L (ref 98–111)
Creatinine, Ser: 0.64 mg/dL (ref 0.44–1.00)
GFR, Estimated: >60 mL/min (ref >60)
Glucose, Bld: 91 mg/dL (ref 70–99)
Potassium: 3.4 mmol/L — ABNORMAL LOW (ref 3.5–5.1)
Sodium: 133 mmol/L — ABNORMAL LOW (ref 135–145)
Total Bilirubin: 1.3 mg/dL — ABNORMAL HIGH (ref 0.3–1.2)
Total Protein: 6.3 g/dL — ABNORMAL LOW (ref 6.5–8.1)

## 2022-05-28 LAB — GLUCOSE, CAPILLARY
Glucose-Capillary: 67 mg/dL — ABNORMAL LOW (ref 70–99)
Glucose-Capillary: 118 mg/dL — ABNORMAL HIGH (ref 70–99)
Glucose-Capillary: 85 mg/dL (ref 70–99)
Glucose-Capillary: 104 mg/dL — ABNORMAL HIGH (ref 70–99)
Glucose-Capillary: 148 mg/dL — ABNORMAL HIGH (ref 70–99)

## 2022-05-28 LAB — CBC
HCT: 32.4 % — ABNORMAL LOW (ref 36.0–46.0)
Hemoglobin: 10.9 g/dL — ABNORMAL LOW (ref 12.0–15.0)
MCH: 29.6 pg (ref 26.0–34.0)
MCHC: 33.6 g/dL (ref 30.0–36.0)
MCV: 88 fL (ref 80.0–100.0)
Platelets: 237 10*3/uL (ref 150–400)
RBC: 3.68 MIL/uL — ABNORMAL LOW (ref 3.87–5.11)
RDW: 13.9 % (ref 11.5–15.5)
WBC: 9.1 10*3/uL (ref 4.0–10.5)
nRBC: 0 % (ref 0.0–0.2)

## 2022-05-28 LAB — MAGNESIUM: Magnesium: 1.7 mg/dL (ref 1.7–2.4)

## 2022-05-28 LAB — SEDIMENTATION RATE: Sed Rate: 73 mm/hr — ABNORMAL HIGH (ref 0–22)

## 2022-05-28 LAB — C-REACTIVE PROTEIN: CRP: 14.2 mg/dL — ABNORMAL HIGH (ref <1.0)

## 2022-05-28 LAB — HEMOGLOBIN A1C
Hgb A1c MFr Bld: 7.3 % — ABNORMAL HIGH (ref 4.8–5.6)
Mean Plasma Glucose: 162.81 mg/dL

## 2022-05-28 MED ORDER — INSULIN ASPART 100 UNIT/ML IJ SOLN: 0.0000 [IU] | INTRAMUSCULAR | Status: DC

## 2022-05-28 MED ORDER — POTASSIUM CHLORIDE 10 MEQ/100ML IV SOLN
10.0000 meq | INTRAVENOUS | Status: AC
  Administered 2022-05-28 (×2): 10 meq via INTRAVENOUS
  Filled 2022-05-28 (×2): qty 100

## 2022-05-28 MED ORDER — DEXTROSE 50 % IV SOLN
12.5000 g | INTRAVENOUS | Status: AC
  Administered 2022-05-28: 12.5 g via INTRAVENOUS
  Filled 2022-05-28: qty 50

## 2022-05-28 MED ORDER — KETOROLAC TROMETHAMINE 15 MG/ML IJ SOLN
15.0000 mg | Freq: Four times a day (QID) | INTRAMUSCULAR | Status: DC | PRN
  Administered 2022-05-28: 15 mg via INTRAVENOUS
  Filled 2022-05-28: qty 1

## 2022-05-28 MED ORDER — ACETAMINOPHEN 650 MG RE SUPP
650.0000 mg | Freq: Four times a day (QID) | RECTAL | Status: DC | PRN
  Administered 2022-05-28: 650 mg via RECTAL
  Filled 2022-05-28: qty 1

## 2022-05-28 MED ORDER — FENTANYL CITRATE PF 50 MCG/ML IJ SOSY
25.0000 ug | PREFILLED_SYRINGE | INTRAMUSCULAR | Status: DC | PRN
  Filled 2022-05-28: qty 1

## 2022-05-28 MED ORDER — DEXTROSE-NACL 5-0.45 % IV SOLN: INTRAVENOUS | Status: DC

## 2022-05-28 MED ORDER — POTASSIUM CHLORIDE 10 MEQ/100ML IV SOLN: 10.0000 meq | INTRAVENOUS | Status: DC

## 2022-05-28 MED ORDER — ACETAMINOPHEN 325 MG PO TABS
650.0000 mg | ORAL_TABLET | Freq: Four times a day (QID) | ORAL | Status: DC | PRN
  Administered 2022-05-28: 650 mg via ORAL
  Filled 2022-05-28: qty 2

## 2022-05-28 MED ORDER — PIPERACILLIN-TAZOBACTAM 3.375 G IVPB
3.3750 g | Freq: Three times a day (TID) | INTRAVENOUS | Status: DC
  Administered 2022-05-28 – 2022-05-29 (×4): 3.375 g via INTRAVENOUS
  Filled 2022-05-28 (×4): qty 50

## 2022-05-28 MED ORDER — INSULIN ASPART 100 UNIT/ML IJ SOLN
0.0000 [IU] | Freq: Three times a day (TID) | INTRAMUSCULAR | Status: DC
  Administered 2022-05-28 – 2022-05-29 (×2): 2 [IU] via SUBCUTANEOUS

## 2022-05-28 NOTE — Consult Note (Signed)
[facility] Consult  Reason for Consult: Evolving omental abscess / infection  Referring Physician: Dr. Alvino Chapel  Chief Complaint   Abdominal Pain     HPI: Kelly Savage is a 67 y.o. female with acute onset of left sided abdominal pain with no real associated nausea or vomiting since Sunday. She has been having Bms that were soft and regular and no blood. She denied any mucus in the stool and has not evidence of IBD. She had a colonoscopy years ago but nothing recently. It looks like this was probably in 2012.  She had a SBR for strangulated bowel related to a hernia that was incarcerated. She had this done with Dr. Lovell Sheehan in May 2023. She had some drainage from her wound and required Keflex but this healed. She did not have mesh placed at the time of her hernia repair.  CT scan demonstrates some inflammation in what looks like omentum and evolving collection. There is no sizable collection to drain.   Past Medical History:  Diagnosis Date   Ankle pain x3 months   bilateral   Diabetes mellitus type II 2006   Diabetes mellitus without complication (HCC)    Phreesia 07/11/2020   Obesity all her life    lost 28 lbs in 14 weeks    Primary localized osteoarthritis of knee    Left   Primary localized osteoarthritis of left knee 09/23/2017   Right thigh pain    Thigh pain x 1 year    bilateral, esp when laying down, worse in the morning , and goes away with activity    Past Surgical History:  Procedure Laterality Date   APPENDECTOMY  2000   BOWEL RESECTION N/A 09/26/2021   Procedure: PARTIAL SMALL BOWEL RESECTION;  Surgeon: Franky Macho, MD;  Location: [facility];  Service: General;  Laterality: N/A;   Cholecysectomy  1998   CHOLECYSTECTOMY     INCISIONAL HERNIA REPAIR N/A 09/26/2021   Procedure: HERNIORRHAPHY INCISIONAL;  Surgeon: Franky Macho, MD;  Location: [facility];  Service: General;  Laterality: N/A;   TONSILLECTOMY     in childhood   TOTAL KNEE ARTHROPLASTY Left  10/18/2017   TOTAL KNEE ARTHROPLASTY Left 10/18/2017   Procedure: TOTAL KNEE ARTHROPLASTY;  Surgeon: Salvatore Marvel, MD;  Location: [facility];  Service: Orthopedics;  Laterality: Left;    Family History  Problem Relation Age of Onset   Heart failure Mother 2       CVA (secondary to afib    Hyperlipidemia Father    Hypertension Father    Cancer Maternal Grandfather        colon    Diabetes Maternal Grandmother     Social History   Tobacco Use   Smoking status: Never   Smokeless tobacco: Never  Vaping Use   Vaping Use: Never used  Substance Use Topics   Alcohol use: Not Currently   Drug use: Never    Medications: I have reviewed the patient's current medications. Prior to Admission:  Medications Prior to Admission  Medication Sig Dispense Refill Last Dose   aspirin EC 81 MG tablet Take 1 tablet (81 mg total) by mouth daily. 30 tablet 11 05/27/2022   benazepril (LOTENSIN) 5 MG tablet Take 1 tablet (5 mg total) by mouth daily. 90 tablet 2 05/27/2022   calcium carbonate (OSCAL) 1500 (600 Ca) MG TABS tablet Take 1,200 mg by mouth every evening.   05/27/2022   diclofenac (VOLTAREN) 75 MG EC tablet Take 1 tablet (75 mg total)  by mouth daily as needed for arthritic pain 90 tablet 0 unk   empagliflozin (JARDIANCE) 10 MG TABS tablet Take 1 tablet (10 mg total) by mouth daily before breakfast. 90 tablet 2 05/27/2022   glipiZIDE (GLUCOTROL XL) 10 MG 24 hr tablet Take 2 tablets (20 mg total) by mouth in the morning at breakfast 180 tablet 1 05/27/2022   rosuvastatin (CRESTOR) 20 MG tablet Take 1 tablet (20 mg total) by mouth daily. 90 tablet 3 Past Week   SitaGLIPtin-MetFORMIN HCl (JANUMET XR) 50-1000 MG TB24 Take 1 tablet by mouth 2 (two) times daily. 60 tablet 5 05/27/2022   blood glucose meter kit and supplies Dispense based on patient and insurance preference. Three times daily testing dx e11.65 1 each 0    glucose blood (FREESTYLE LITE) test strip Use as instructed to test blood sugar once  daily. 100 each 11    Lancets (FREESTYLE) lancets Use as instructed to test blood sugar once daily. 100 each 2    Scheduled:  insulin aspart  0-15 Units Subcutaneous Q4H   Continuous:  dextrose 5 % and 0.45% NaCl 125 mL/hr at 05/28/22 0936   piperacillin-tazobactam (ZOSYN)  IV 3.375 g (05/28/22 0938)   potassium chloride 10 mEq (05/28/22 0937)   HWE:XHBZJIRCVELFY **OR** acetaminophen, fentaNYL (SUBLIMAZE) injection, ketorolac, ondansetron  No Known Allergies   ROS:  A comprehensive review of systems was negative except for: Gastrointestinal: positive for abdominal pain  Blood pressure 107/60, pulse 80, temperature 99.2 F (37.3 C), temperature source Oral, resp. rate 18, height 5\' 8"  (1.727 m), weight 90.1 kg, SpO2 93 %. Physical Exam Vitals reviewed.  HENT:     Head: Normocephalic.  Cardiovascular:     Rate and Rhythm: Normal rate.  Pulmonary:     Effort: Pulmonary effort is normal.  Abdominal:     General: There is no distension.     Palpations: Abdomen is soft.     Tenderness: There is abdominal tenderness in the periumbilical area. There is no guarding or rebound.  Musculoskeletal:     Comments: Moves all extremities   Skin:    General: Skin is warm.  Neurological:     General: No focal deficit present.     Mental Status: She is alert and oriented to person, place, and time.  Psychiatric:        Mood and Affect: Mood normal.        Behavior: Behavior normal.     Results: Results for orders placed or performed during the hospital encounter of 05/27/22 (from the past 48 hour(s))  Blood Culture (routine x 2)     Status: None (Preliminary result)   Collection Time: 05/27/22 10:15 PM   Specimen: BLOOD  Result Value Ref Range   Specimen Description BLOOD LEFT ANTECUBITAL    Special Requests      BOTTLES DRAWN AEROBIC AND ANAEROBIC Blood Culture adequate volume Performed at [facility], [address], [place], [place] [address]    Culture PENDING    Report  Status PENDING   Lactic acid, plasma     Status: None   Collection Time: 05/27/22 10:17 PM  Result Value Ref Range   Lactic Acid, Venous 0.7 0.5 - 1.9 mmol/L    Comment: Performed at [facility], [address], [place], [place] [address]  Comprehensive metabolic panel     Status: Abnormal   Collection Time: 05/27/22 10:17 PM  Result Value Ref Range   Sodium 132 (L) 135 - 145 mmol/L   Potassium  3.6 3.5 - 5.1 mmol/L   Chloride 95 (L) 98 - 111 mmol/L   CO2 23 22 - 32 mmol/L   Glucose, Bld 117 (H) 70 - 99 mg/dL    Comment: Glucose reference range applies only to samples taken after fasting for at least 8 hours.   BUN 11 8 - 23 mg/dL   Creatinine, Ser 1.74 0.44 - 1.00 mg/dL   Calcium 8.8 (L) 8.9 - 10.3 mg/dL   Total Protein 7.5 6.5 - 8.1 g/dL   Albumin 3.1 (L) 3.5 - 5.0 g/dL   AST 16 15 - 41 U/L   ALT 16 0 - 44 U/L   Alkaline Phosphatase 61 38 - 126 U/L   Total Bilirubin 1.4 (H) 0.3 - 1.2 mg/dL   GFR, Estimated >08 >14 mL/min    Comment: (NOTE) Calculated using the CKD-EPI Creatinine Equation (2021)    Anion gap 14 5 - 15    Comment: Performed at [facility], [address], [place], [place] [address]  CBC with Differential     Status: Abnormal   Collection Time: 05/27/22 10:17 PM  Result Value Ref Range   WBC 9.8 4.0 - 10.5 K/uL   RBC 3.90 3.87 - 5.11 MIL/uL   Hemoglobin 11.3 (L) 12.0 - 15.0 g/dL   HCT 63.1 (L) 49.7 - 02.6 %   MCV 88.2 80.0 - 100.0 fL   MCH 29.0 26.0 - 34.0 pg   MCHC 32.8 30.0 - 36.0 g/dL   RDW 37.8 58.8 - 50.2 %   Platelets 254 150 - 400 K/uL   nRBC 0.0 0.0 - 0.2 %   Neutrophils Relative % 85 %   Neutro Abs 8.3 (H) 1.7 - 7.7 K/uL   Lymphocytes Relative 8 %   Lymphs Abs 0.8 0.7 - 4.0 K/uL   Monocytes Relative 6 %   Monocytes Absolute 0.6 0.1 - 1.0 K/uL   Eosinophils Relative 1 %   Eosinophils Absolute 0.1 0.0 - 0.5 K/uL   Basophils Relative 0 %   Basophils Absolute 0.0 0.0 - 0.1 K/uL   Immature Granulocytes 0 %   Abs Immature Granulocytes 0.03 0.00  - 0.07 K/uL    Comment: Performed at [facility], [address], [place], [place] [address]  Protime-INR     Status: Abnormal   Collection Time: 05/27/22 10:17 PM  Result Value Ref Range   Prothrombin Time 15.6 (H) 11.4 - 15.2 seconds   INR 1.3 (H) 0.8 - 1.2    Comment: (NOTE) INR goal varies based on device and disease states. Performed at [facility], [address], [place], [place] [address]   Lipase, blood     Status: None   Collection Time: 05/27/22 10:17 PM  Result Value Ref Range   Lipase 30 11 - 51 U/L    Comment: Performed at [facility], [address], [place], [place] [address]  Sedimentation rate     Status: Abnormal   Collection Time: 05/27/22 10:17 PM  Result Value Ref Range   Sed Rate 73 (H) 0 - 22 mm/hr    Comment: Performed at [facility], [address], [place], [place] [address]  Blood Culture (routine x 2)     Status: None (Preliminary result)   Collection Time: 05/27/22 10:18 PM   Specimen: Blood  Result Value Ref Range   Specimen Description BLOOD BLOOD LEFT WRIST    Special Requests      BOTTLES DRAWN AEROBIC AND ANAEROBIC Blood Culture adequate volume Performed at [facility], [address], [place], [place] [address]    Culture PENDING    Report Status PENDING   Urinalysis, Routine w reflex microscopic -Urine, Clean Catch     Status: Abnormal   Collection Time: 05/27/22 10:26 PM  Result Value Ref Range   Color, Urine YELLOW YELLOW   APPearance CLEAR CLEAR   Specific Gravity, Urine 1.032 (H) 1.005 - 1.030   pH 5.0 5.0 - 8.0   Glucose, UA >=500 (A) NEGATIVE mg/dL   Hgb urine dipstick MODERATE (A) NEGATIVE   Bilirubin Urine NEGATIVE NEGATIVE   Ketones, ur 20 (A) NEGATIVE mg/dL   Protein, ur NEGATIVE NEGATIVE mg/dL   Nitrite NEGATIVE NEGATIVE   Leukocytes,Ua NEGATIVE NEGATIVE   RBC / HPF 0-5 0 - 5 RBC/hpf   WBC, UA 0-5 0 - 5 WBC/hpf   Bacteria, UA RARE (A) NONE SEEN   Squamous Epithelial / HPF 0-5 0 - 5 /HPF   Mucus PRESENT     Comment:  Performed at [facility], [address] [place], [place] [address]  Glucose, capillary     Status: Abnormal   Collection Time: 05/28/22  2:53 AM  Result Value Ref Range   Glucose-Capillary 67 (L) 70 - 99 mg/dL    Comment: Glucose reference range applies only to samples taken after fasting for at least 8 hours.  Glucose, capillary     Status: Abnormal   Collection Time: 05/28/22  3:34 AM  Result Value Ref Range   Glucose-Capillary 118 (H) 70 - 99 mg/dL    Comment: Glucose reference range applies only to samples taken after fasting for at least 8 hours.  Comprehensive metabolic panel     Status: Abnormal   Collection Time: 05/28/22  4:28 AM  Result Value Ref Range   Sodium 133 (L) 135 - 145 mmol/L   Potassium 3.4 (L) 3.5 - 5.1 mmol/L   Chloride 99 98 - 111 mmol/L   CO2 22 22 - 32 mmol/L   Glucose, Bld 91 70 - 99 mg/dL    Comment: Glucose reference range applies only to samples taken after fasting for at least 8 hours.   BUN 9 8 - 23 mg/dL   Creatinine, Ser 5.95 0.44 - 1.00 mg/dL   Calcium 8.3 (L) 8.9 - 10.3 mg/dL   Total Protein 6.3 (L) 6.5 - 8.1 g/dL   Albumin 2.7 (L) 3.5 - 5.0 g/dL   AST 15 15 - 41 U/L   ALT 13 0 - 44 U/L   Alkaline Phosphatase 54 38 - 126 U/L   Total Bilirubin 1.3 (H) 0.3 - 1.2 mg/dL   GFR, Estimated >63 >87 mL/min    Comment: (NOTE) Calculated using the CKD-EPI Creatinine Equation (2021)    Anion gap 12 5 - 15    Comment: Performed at [facility], [address] [place], [place] [address]  CBC     Status: Abnormal   Collection Time: 05/28/22  4:28 AM  Result Value Ref Range   WBC 9.1 4.0 - 10.5 K/uL   RBC 3.68 (L) 3.87 - 5.11 MIL/uL   Hemoglobin 10.9 (L) 12.0 - 15.0 g/dL   HCT 29.5 (L) 18.8 - 41.6 %   MCV 88.0 80.0 - 100.0 fL   MCH 29.6 26.0 - 34.0 pg   MCHC 33.6 30.0 - 36.0 g/dL   RDW 60.6 30.1 - 60.1 %   Platelets 237 150 - 400 K/uL   nRBC 0.0 0.0 - 0.2 %    Comment: Performed at [facility], [address] [place], [place] [address]   Magnesium     Status: None   Collection Time: 05/28/22  4:28 AM  Result Value Ref Range   Magnesium 1.7 1.7 - 2.4 mg/dL    Comment: Performed at [facility], [address] [place], [place] [address]  Phosphorus     Status: None   Collection Time: 05/28/22  4:28 AM  Result Value Ref Range   Phosphorus 3.7 2.5 - 4.6 mg/dL    Comment: Performed at [facility], [address] [place], [place] [address]  Glucose, capillary     Status: None   Collection Time: 05/28/22  6:10 AM  Result Value Ref Range   Glucose-Capillary 85 70 - 99 mg/dL    Comment: Glucose reference range applies only to samples taken after fasting for at least 8 hours.   Reviewed scan with radiology and see omentum with no signs of bowel perforation, unsure if thickened bowel and colon secondary to the omental inflammation and stranding, no sizable collection to drain  CT Abdomen Pelvis W Contrast  Result Date: 05/27/2022 CLINICAL DATA:  Lower abdominal pain with chills and fatigue. EXAM: CT ABDOMEN AND PELVIS WITH CONTRAST TECHNIQUE: Multidetector CT imaging of the abdomen and pelvis was performed using the standard protocol following bolus administration of intravenous contrast. RADIATION DOSE REDUCTION: This exam was performed according to the departmental dose-optimization program which includes automated exposure control, adjustment of the mA and/or kV according to patient size and/or use of iterative reconstruction technique. CONTRAST:  100mL OMNIPAQUE IOHEXOL 300 MG/ML  SOLN COMPARISON:  Sep 25, 2021 FINDINGS: Lower chest: No acute abnormality. Hepatobiliary: No focal liver abnormality is seen. Mild central intrahepatic biliary dilatation is noted. Status post cholecystectomy. The common bile duct is dilated and measures 11 mm in diameter. Pancreas: Unremarkable. No pancreatic ductal dilatation or surrounding inflammatory changes. Spleen: Normal in size without focal abnormality. Adrenals/Urinary Tract: Adrenal glands are  unremarkable. Kidneys are normal, without obstructing renal calculi, focal lesion, or hydronephrosis. A 2 mm nonobstructing renal calculus is seen within the mid left kidney. Bladder is unremarkable. Stomach/Bowel: Stomach is within normal limits. The appendix is not visualized. No evidence of bowel dilatation. A 4.5 cm long, markedly thickened and inflamed segment of small bowel is seen within the anterior aspect of the mid abdomen. This is at the level the umbilicus and extends into a 2.9 cm x 1.1 cm left-sided para umbilical hernia. A 4.5 cm x 2.9 cm x 3.3 cm complex area of low attenuation (approximately 35.07 Hounsfield units), with thick, heterogeneous surrounding hyperdense wall, is seen just above the segment of inflamed small bowel. Marked severity surrounding inflammatory fat stranding is seen. A short segment of moderately thickened and inflamed mid transverse colon is seen just above this inflammatory process (best seen on sagittal reformatted images 63 through 74, CT series 6). Vascular/Lymphatic: No significant vascular findings are present. No enlarged abdominal or pelvic lymph nodes. Reproductive: Uterus and bilateral adnexa are unremarkable. Other: No abdominal wall hernia or abnormality. No abdominopelvic ascites. Musculoskeletal: Multilevel degenerative changes seen throughout the lumbar spine. IMPRESSION: 1. Markedly thickened and inflamed segment of small bowel within the mid abdomen, as described above, with an adjacent 4.5 cm x 2.9 cm x 3.3 cm complex inflammatory lesion likely representing early abscess formation. 2. Short segment of moderately thickened and inflamed mid transverse colon just above this inflammatory process. 3. 2 mm nonobstructing left renal calculus. 4. Evidence of prior cholecystectomy with mild central intrahepatic biliary  dilatation and dilatation of the common bile duct. Correlation with liver enzymes is recommended. Electronically Signed   By: Virgina Norfolk M.D.    On: 05/27/2022 19:06     Assessment & Plan:  Kelly Savage is a 67 y.o. female with a question of inflammation possible infection in her omentum adjacent to her hernia repair but without signs of any bowel perforation. She has some thickened bowel and could have some degree of IBD but no symptoms. Her SB pathology from prior did not show any IBD.  She is feeling better.  IV antibiotics today Diet as tolerated If tolerates can go home tomorrow with Augmentin to complete a 7 day course Office setting up CT scan for 06/03/2022 Will need outpatient GI referral for transverse colon thickening and remote history of any colonoscopy    All questions were answered to the satisfaction of the patient.   Virl Cagey 05/28/2022, 8:21 AM

## 2022-05-28 NOTE — Progress Notes (Addendum)
PROGRESS NOTE    Kelly Savage  TML:[id number] DOB: Apr 19, 1956 DOA: 05/27/2022 PCP: Kerri Perches, MD     Brief Narrative:  Kelly Savage is a 67 y.o. female with medical history significant of hypertension, T2DM, hyperlipidemia who presents to the emergency department due to 4-day onset of subjective fever and chills and 2-day onset of generalized abdominal soreness with intermittent nausea without vomiting.  Patient went to see her PCP this morning, CT scan was done which showed a thickened inflamed segment of small bowel within the mid abdomen with an adjacent complex inflammatory lesion possibly representing early abscess formation and she was asked to go to the ED for further evaluation and management.    She was admitted from 5/25 to 5/31 due to incarcerated small bowel with a partial small bowel obstruction which was treated with partial small bowel resection and ventral herniorrhaphy on 09/26/2021 by Dr. Lovell Sheehan.  Patient was started on empiric IV antibiotics, general surgery consulted.  New events last 24 hours / Subjective: Had episode of headache this morning, improved with Toradol.  Complains of generalized abdominal soreness  Assessment & Plan:   Principal Problem:   Abdominal pain Active Problems:   Hypokalemia   Type 2 diabetes mellitus with vascular disease (HCC)   Hyperlipidemia with target LDL less than 100   Essential hypertension   Hypoalbuminemia due to protein-calorie malnutrition (HCC)   Intra-abdominal infection   Small bowel inflammation and infection, omental abscess -CT abdomen pelvis: Markedly thickened and inflamed segment of small bowel within the mid abdomen, as described above, with an adjacent 4.5 cm x 2.9 cm x 3.3 cm complex inflammatory lesion likely representing early abscess formation. Short segment of moderately thickened and inflamed mid transverse colon just above this inflammatory process. -Appreciate general surgery  consultation -Continue IV Zosyn, can transition to Augmentin as patient improves -Follow-up with Dr. Henreitta Leber outpatient 2/1, repeat CT scheduled 1/31  -Outpatient GI referral  -Discussed with Dr. Henreitta Leber today   Hypokalemia  -Replace, trend  Diabetes mellitus, well-controlled -Hemoglobin A1c 7.3 -Sliding scale insulin  DVT prophylaxis:  SCDs Start: 05/28/22 0313  Code Status: Full code Family Communication: No family at bedside Disposition Plan:  Status is: Inpatient Remains inpatient appropriate because: Remains on IV antibiotics today  Consultants:  General surgery  Antimicrobials:  Anti-infectives (From admission, onward)    Start     Dose/Rate Route Frequency Ordered Stop   05/28/22 0600  piperacillin-tazobactam (ZOSYN) IVPB 3.375 g        3.375 g 12.5 mL/hr over 240 Minutes Intravenous Every 8 hours 05/28/22 0450     05/27/22 2130  piperacillin-tazobactam (ZOSYN) IVPB 3.375 g        3.375 g 100 mL/hr over 30 Minutes Intravenous  Once 05/27/22 2120 05/27/22 2210        Objective: Vitals:   05/27/22 2230 05/27/22 2300 05/28/22 0056 05/28/22 0439  BP: 105/70 107/68 102/74 107/60  Pulse: 85 81 90 80  Resp: (!) 23 18 18 18   Temp:   98.1 F (36.7 C) 99.2 F (37.3 C)  TempSrc:   Oral Oral  SpO2: 98% 97% 100% 93%  Weight:   90.1 kg   Height:   5\' 8"  (1.727 m)     Intake/Output Summary (Last 24 hours) at 05/28/2022 1123 Last data filed at 05/27/2022 2210 Gross per 24 hour  Intake 50 ml  Output --  Net 50 ml   Filed Weights   05/27/22 2113 05/28/22 0056  Weight: 89.4  kg 90.1 kg    Examination:  General exam: Appears calm and comfortable  Respiratory system: Clear to auscultation. Respiratory effort normal. No respiratory distress. No conversational dyspnea.  Cardiovascular system: S1 & S2 heard, RRR. No murmurs. No pedal edema. Gastrointestinal system: Abdomen is nondistended, soft and +periumbilical TTP. Normal bowel sounds heard. Central nervous  system: Alert and oriented. No focal neurological deficits. Speech clear.  Extremities: Symmetric in appearance  Skin: No rashes, lesions or ulcers on exposed skin  Psychiatry: Judgement and insight appear normal. Mood & affect appropriate.   Data Reviewed: I have personally reviewed following labs and imaging studies  CBC: Recent Labs  Lab 05/27/22 1548 05/27/22 2217 05/28/22 0428  WBC 9.8 9.8 9.1  NEUTROABS 7.6 8.3*  --   HGB 12.3 11.3* 10.9*  HCT 36.9 34.4* 32.4*  MCV 88.3 88.2 88.0  PLT 296 254 846   Basic Metabolic Panel: Recent Labs  Lab 05/27/22 1548 05/27/22 2217 05/28/22 0428  NA 134* 132* 133*  K 3.3* 3.6 3.4*  CL 96* 95* 99  CO2 25 23 22   GLUCOSE 107* 117* 91  BUN 11 11 9   CREATININE 0.69 0.68 0.64  CALCIUM 9.0 8.8* 8.3*  MG  --   --  1.7  PHOS  --   --  3.7   GFR: Estimated Creatinine Clearance: 81.2 mL/min (by C-G formula based on SCr of 0.64 mg/dL). Liver Function Tests: Recent Labs  Lab 05/27/22 2217 05/28/22 0428  AST 16 15  ALT 16 13  ALKPHOS 61 54  BILITOT 1.4* 1.3*  PROT 7.5 6.3*  ALBUMIN 3.1* 2.7*   Recent Labs  Lab 05/27/22 2217  LIPASE 30   No results for input(s): "AMMONIA" in the last 168 hours. Coagulation Profile: Recent Labs  Lab 05/27/22 2217  INR 1.3*   Cardiac Enzymes: No results for input(s): "CKTOTAL", "CKMB", "CKMBINDEX", "TROPONINI" in the last 168 hours. BNP (last 3 results) No results for input(s): "PROBNP" in the last 8760 hours. HbA1C: Recent Labs    05/27/22 2217  HGBA1C 7.3*   CBG: Recent Labs  Lab 05/28/22 0253 05/28/22 0334 05/28/22 0610  GLUCAP 67* 118* 85   Lipid Profile: No results for input(s): "CHOL", "HDL", "LDLCALC", "TRIG", "CHOLHDL", "LDLDIRECT" in the last 72 hours. Thyroid Function Tests: No results for input(s): "TSH", "T4TOTAL", "FREET4", "T3FREE", "THYROIDAB" in the last 72 hours. Anemia Panel: No results for input(s): "VITAMINB12", "FOLATE", "FERRITIN", "TIBC", "IRON",  "RETICCTPCT" in the last 72 hours. Sepsis Labs: Recent Labs  Lab 05/27/22 2217  LATICACIDVEN 0.7    Recent Results (from the past 240 hour(s))  Blood Culture (routine x 2)     Status: None (Preliminary result)   Collection Time: 05/27/22 10:15 PM   Specimen: BLOOD  Result Value Ref Range Status   Specimen Description BLOOD LEFT ANTECUBITAL  Final   Special Requests   Final    BOTTLES DRAWN AEROBIC AND ANAEROBIC Blood Culture adequate volume Performed at [facility], [address], [place], [place] [address]    Culture PENDING  Incomplete   Report Status PENDING  Incomplete  Blood Culture (routine x 2)     Status: None (Preliminary result)   Collection Time: 05/27/22 10:18 PM   Specimen: Blood  Result Value Ref Range Status   Specimen Description BLOOD BLOOD LEFT WRIST  Final   Special Requests   Final    BOTTLES DRAWN AEROBIC AND ANAEROBIC Blood Culture adequate volume Performed at [facility], [address] [place], [place] [address]  Culture PENDING  Incomplete   Report Status PENDING  Incomplete      Radiology Studies: CT Abdomen Pelvis W Contrast  Result Date: 05/27/2022 CLINICAL DATA:  Lower abdominal pain with chills and fatigue. EXAM: CT ABDOMEN AND PELVIS WITH CONTRAST TECHNIQUE: Multidetector CT imaging of the abdomen and pelvis was performed using the standard protocol following bolus administration of intravenous contrast. RADIATION DOSE REDUCTION: This exam was performed according to the departmental dose-optimization program which includes automated exposure control, adjustment of the mA and/or kV according to patient size and/or use of iterative reconstruction technique. CONTRAST:  128mL OMNIPAQUE IOHEXOL 300 MG/ML  SOLN COMPARISON:  Sep 25, 2021 FINDINGS: Lower chest: No acute abnormality. Hepatobiliary: No focal liver abnormality is seen. Mild central intrahepatic biliary dilatation is noted. Status post cholecystectomy. The common bile duct is dilated and  measures 11 mm in diameter. Pancreas: Unremarkable. No pancreatic ductal dilatation or surrounding inflammatory changes. Spleen: Normal in size without focal abnormality. Adrenals/Urinary Tract: Adrenal glands are unremarkable. Kidneys are normal, without obstructing renal calculi, focal lesion, or hydronephrosis. A 2 mm nonobstructing renal calculus is seen within the mid left kidney. Bladder is unremarkable. Stomach/Bowel: Stomach is within normal limits. The appendix is not visualized. No evidence of bowel dilatation. A 4.5 cm long, markedly thickened and inflamed segment of small bowel is seen within the anterior aspect of the mid abdomen. This is at the level the umbilicus and extends into a 2.9 cm x 1.1 cm left-sided para umbilical hernia. A 4.5 cm x 2.9 cm x 3.3 cm complex area of low attenuation (approximately 35.07 Hounsfield units), with thick, heterogeneous surrounding hyperdense wall, is seen just above the segment of inflamed small bowel. Marked severity surrounding inflammatory fat stranding is seen. A short segment of moderately thickened and inflamed mid transverse colon is seen just above this inflammatory process (best seen on sagittal reformatted images 63 through 74, CT series 6). Vascular/Lymphatic: No significant vascular findings are present. No enlarged abdominal or pelvic lymph nodes. Reproductive: Uterus and bilateral adnexa are unremarkable. Other: No abdominal wall hernia or abnormality. No abdominopelvic ascites. Musculoskeletal: Multilevel degenerative changes seen throughout the lumbar spine. IMPRESSION: 1. Markedly thickened and inflamed segment of small bowel within the mid abdomen, as described above, with an adjacent 4.5 cm x 2.9 cm x 3.3 cm complex inflammatory lesion likely representing early abscess formation. 2. Short segment of moderately thickened and inflamed mid transverse colon just above this inflammatory process. 3. 2 mm nonobstructing left renal calculus. 4. Evidence of  prior cholecystectomy with mild central intrahepatic biliary dilatation and dilatation of the common bile duct. Correlation with liver enzymes is recommended. Electronically Signed   By: Virgina Norfolk M.D.   On: 05/27/2022 19:06      Scheduled Meds:  insulin aspart  0-15 Units Subcutaneous Q4H   Continuous Infusions:  dextrose 5 % and 0.45% NaCl 125 mL/hr at 05/28/22 0936   piperacillin-tazobactam (ZOSYN)  IV 3.375 g (05/28/22 3329)   potassium chloride 10 mEq (05/28/22 1052)     LOS: 1 day   Time spent: 35 minutes   Dessa Phi, DO [facility] 05/28/2022, 11:23 AM   Available via Epic secure chat 7am-7pm After these hours, please refer to coverage provider listed on [email or web address]

## 2022-05-28 NOTE — Progress Notes (Signed)
[facility]  CT is scheduled for 06/03/2022 at [facility]- she will need to arrive 7am to start oral contrast- NPO x4 hrs that morning.   Future Appointments  Date Time Provider [place]  06/03/2022  9:00 AM AP-CT 1 AP-CT [place] [facility]  06/04/2022  2:45 PM Virl Cagey, MD RS-RS None  06/09/2022  2:40 PM Fayrene Helper, MD RPC-RPC [facility]  07/14/2022  9:40 AM GI-BCG MM 3 GI-BCGMM GI-BREAST CE   Curlene Labrum, MD [facility] [address] [name], [name] [phone] (office)

## 2022-05-28 NOTE — Progress Notes (Signed)
Pharmacy Antibiotic Note  Kelly Savage is a 67 y.o. female admitted on 05/27/2022 with  intra-abdominal infection .  Pharmacy has been consulted for Zosyn dosing. WBC WNL. Renal function good.   Plan: Zosyn 3.375G IV q8h to be infused over 4 hours  Height: 5\' 8"  (172.7 cm) Weight: 90.1 kg (198 lb 9.6 oz) IBW/kg (Calculated) : 63.9  Temp (24hrs), Avg:98.7 F (37.1 C), Min:98.1 F (36.7 C), Max:99.2 F (37.3 C)  Recent Labs  Lab 05/27/22 1548 05/27/22 2217  WBC 9.8 9.8  CREATININE 0.69 0.68  LATICACIDVEN  --  0.7    Estimated Creatinine Clearance: 81.2 mL/min (by C-G formula based on SCr of 0.68 mg/dL).    No Known Allergies  Narda Bonds, PharmD, BCPS Clinical Pharmacist Phone: [phone]

## 2022-05-28 NOTE — TOC Progression Note (Signed)
  Transition of Care [facility]) Screening Note   Patient Details  Name: Kelly Savage Date of Birth: Dec 08, 1955   Transition of Care [facility]) CM/SW Contact:    Shade Flood, LCSW Phone Number: 05/28/2022, 1:52 PM    Transition of Care Department [facility]) has reviewed patient and no TOC needs have been identified at this time. We will continue to monitor patient advancement through interdisciplinary progression rounds. If new patient transition needs arise, please place a TOC consult.

## 2022-05-29 ENCOUNTER — Other Ambulatory Visit (HOSPITAL_COMMUNITY): Payer: Self-pay

## 2022-05-29 DIAGNOSIS — R1033 Periumbilical pain: Secondary | ICD-10-CM | POA: Diagnosis not present

## 2022-05-29 LAB — BASIC METABOLIC PANEL
Anion gap: 11 (ref 5–15)
BUN: 13 mg/dL (ref 8–23)
CO2: 25 mmol/L (ref 22–32)
Calcium: 8.6 mg/dL — ABNORMAL LOW (ref 8.9–10.3)
Chloride: 102 mmol/L (ref 98–111)
Creatinine, Ser: 0.65 mg/dL (ref 0.44–1.00)
GFR, Estimated: >60 mL/min (ref >60)
Glucose, Bld: 132 mg/dL — ABNORMAL HIGH (ref 70–99)
Potassium: 4.1 mmol/L (ref 3.5–5.1)
Sodium: 138 mmol/L (ref 135–145)

## 2022-05-29 LAB — CBC
HCT: 34.8 % — ABNORMAL LOW (ref 36.0–46.0)
Hemoglobin: 11.2 g/dL — ABNORMAL LOW (ref 12.0–15.0)
MCH: 29.2 pg (ref 26.0–34.0)
MCHC: 32.2 g/dL (ref 30.0–36.0)
MCV: 90.6 fL (ref 80.0–100.0)
Platelets: 216 10*3/uL (ref 150–400)
RBC: 3.84 MIL/uL — ABNORMAL LOW (ref 3.87–5.11)
RDW: 14.3 % (ref 11.5–15.5)
WBC: 8.1 10*3/uL (ref 4.0–10.5)
nRBC: 0 % (ref 0.0–0.2)

## 2022-05-29 LAB — GLUCOSE, CAPILLARY: Glucose-Capillary: 127 mg/dL — ABNORMAL HIGH (ref 70–99)

## 2022-05-29 LAB — MAGNESIUM: Magnesium: 1.9 mg/dL (ref 1.7–2.4)

## 2022-05-29 MED ORDER — AMOXICILLIN-POT CLAVULANATE 875-125 MG PO TABS
1.0000 | ORAL_TABLET | Freq: Two times a day (BID) | ORAL | 0 refills | Status: DC
  Filled 2022-05-29: qty 14, 7d supply, fill #0

## 2022-05-29 NOTE — Discharge Instructions (Signed)
CT is scheduled for 06/03/2022 at [facility]- she will need to arrive 7am to start oral contrast NPO starting at [facility] 06/03/22.     Go to the Ed with worsening pain, fevers or changes.

## 2022-05-29 NOTE — Discharge Summary (Signed)
Physician Discharge Summary  Kelly Savage:[id number] DOB: 25-Apr-1956 DOA: 05/27/2022  PCP: Kerri Perches, MD  Admit date: 05/27/2022 Discharge date: 05/29/2022  Admitted From: Home Disposition:  Home  Recommendations for Outpatient Follow-up:  Follow up with outpatient CT and with Dr. Henreitta Leber as scheduled  Discharge Condition: Stable CODE STATUS: Full code Diet recommendation:  Diet Orders (From admission, onward)     Start     Ordered   05/29/22 0000  Diet Carb Modified        05/29/22 1041   05/28/22 0939  Diet Carb Modified Fluid consistency: Thin; Room service appropriate? Yes  Diet effective now       Question Answer Comment  Diet-HS Snack? Nothing   Calorie Level Medium 1600-2000   Fluid consistency: Thin   Room service appropriate? Yes      05/28/22 8119           Brief/Interim Summary: Kelly Savage is a 67 y.o. female with medical history significant of hypertension, T2DM, hyperlipidemia who presents to the emergency department due to 4-day onset of subjective fever and chills and 2-day onset of generalized abdominal soreness with intermittent nausea without vomiting.  Patient went to see her PCP this morning, CT scan was done which showed a thickened inflamed segment of small bowel within the mid abdomen with an adjacent complex inflammatory lesion possibly representing early abscess formation and she was asked to go to the ED for further evaluation and management.     She was admitted from 5/25 to 5/31 due to incarcerated small bowel with a partial small bowel obstruction which was treated with partial small bowel resection and ventral herniorrhaphy on 09/26/2021 by Dr. Lovell Sheehan.   Patient was started on empiric IV antibiotics, general surgery consulted.  Patient had clinical improvement on IV antibiotics.  Diet was advanced and patient will follow-up outpatient for repeat CT scan.  Outpatient GI referral placed.  Discharge Diagnoses:   Principal Problem:    Abdominal pain Active Problems:   Hypokalemia   Type 2 diabetes mellitus with vascular disease (HCC)   Hyperlipidemia with target LDL less than 100   Essential hypertension   Hypoalbuminemia due to protein-calorie malnutrition (HCC)   Intra-abdominal infection   Small bowel inflammation and infection, omental abscess -CT abdomen pelvis: Markedly thickened and inflamed segment of small bowel within the mid abdomen, as described above, with an adjacent 4.5 cm x 2.9 cm x 3.3 cm complex inflammatory lesion likely representing early abscess formation. Short segment of moderately thickened and inflamed mid transverse colon just above this inflammatory process. -Appreciate general surgery consultation -IV Zosyn --> Augmentin -Follow-up with Dr. Henreitta Leber outpatient 2/1, repeat CT scheduled 1/31  -Outpatient GI referral  -Discussed with Dr. Henreitta Leber today     Diabetes mellitus, well-controlled -Hemoglobin A1c 7.3 -Resume home meds  -Lipidemia -Continue Crestor  Hypertension -Continue Lotensin   Discharge Instructions  Discharge Instructions     Ambulatory referral to Gastroenterology   Complete by: As directed    What is the reason for referral?: Colonoscopy   Call MD for:  difficulty breathing, headache or visual disturbances   Complete by: As directed    Call MD for:  extreme fatigue   Complete by: As directed    Call MD for:  hives   Complete by: As directed    Call MD for:  persistant dizziness or light-headedness   Complete by: As directed    Call MD for:  persistant nausea and vomiting  Complete by: As directed    Call MD for:  severe uncontrolled pain   Complete by: As directed    Call MD for:  temperature >100.4   Complete by: As directed    Diet Carb Modified   Complete by: As directed    Discharge instructions   Complete by: As directed    You were cared for by a hospitalist during your hospital stay. If you have any questions about your discharge medications or  the care you received while you were in the hospital after you are discharged, you can call the unit and ask to speak with the hospitalist on call if the hospitalist that took care of you is not available. Once you are discharged, your primary care physician will handle any further medical issues. Please note that NO REFILLS for any discharge medications will be authorized once you are discharged, as it is imperative that you return to your primary care physician (or establish a relationship with a primary care physician if you do not have one) for your aftercare needs so that they can reassess your need for medications and monitor your lab values.   Increase activity slowly   Complete by: As directed       Allergies as of 05/29/2022   No Known Allergies      Medication List     TAKE these medications    amoxicillin-clavulanate 875-125 MG tablet Commonly known as: AUGMENTIN Take 1 tablet by mouth 2 (two) times daily for 7 days.   Aspirin Low Dose 81 MG tablet Generic drug: aspirin EC Take 1 tablet (81 mg total) by mouth daily.   benazepril 5 MG tablet Commonly known as: LOTENSIN Take 1 tablet (5 mg total) by mouth daily.   blood glucose meter kit and supplies Dispense based on patient and insurance preference. Three times daily testing dx e11.65   calcium carbonate 1500 (600 Ca) MG Tabs tablet Commonly known as: OSCAL Take 1,200 mg by mouth every evening.   diclofenac 75 MG EC tablet Commonly known as: VOLTAREN Take 1 tablet (75 mg total) by mouth daily as needed for arthritic pain   freestyle lancets Use as instructed to test blood sugar once daily.   FREESTYLE LITE test strip Generic drug: glucose blood Use as instructed to test blood sugar once daily.   glipiZIDE 10 MG 24 hr tablet Commonly known as: GLUCOTROL XL Take 2 tablets (20 mg total) by mouth in the morning at breakfast   Janumet XR 50-1000 MG Tb24 Generic drug: SitaGLIPtin-MetFORMIN HCl Take 1 tablet by  mouth 2 (two) times daily.   Jardiance 10 MG Tabs tablet Generic drug: empagliflozin Take 1 tablet (10 mg total) by mouth daily before breakfast.   rosuvastatin 20 MG tablet Commonly known as: Crestor Take 1 tablet (20 mg total) by mouth daily.        Follow-up Information     Kerri Perches, MD Follow up.   Specialty: Family Medicine Contact information: [address] [place] [address] [phone]         Lucretia Roers, MD. Go on 06/04/2022.   Specialty: General Surgery Contact information: [address] [place] [facility] [address] [phone]                No Known Allergies  Consultations: General surgery    Procedures/Studies: CT Abdomen Pelvis W Contrast  Result Date: 05/27/2022 CLINICAL DATA:  Lower abdominal pain with chills and fatigue. EXAM: CT ABDOMEN AND  PELVIS WITH CONTRAST TECHNIQUE: Multidetector CT imaging of the abdomen and pelvis was performed using the standard protocol following bolus administration of intravenous contrast. RADIATION DOSE REDUCTION: This exam was performed according to the departmental dose-optimization program which includes automated exposure control, adjustment of the mA and/or kV according to patient size and/or use of iterative reconstruction technique. CONTRAST:  OMNIPAQUE IOHEXOL 300 MG/ML  SOLN COMPARISON:  Sep 25, 2021 FINDINGS: Lower chest: No acute abnormality. Hepatobiliary: No focal liver abnormality is seen. Mild central intrahepatic biliary dilatation is noted. Status post cholecystectomy. The common bile duct is dilated and measures 11 mm in diameter. Pancreas: Unremarkable. No pancreatic ductal dilatation or surrounding inflammatory changes. Spleen: Normal in size without focal abnormality. Adrenals/Urinary Tract: Adrenal glands are unremarkable. Kidneys are normal, without obstructing renal calculi, focal lesion, or hydronephrosis. A 2 mm nonobstructing renal calculus is seen within  the mid left kidney. Bladder is unremarkable. Stomach/Bowel: Stomach is within normal limits. The appendix is not visualized. No evidence of bowel dilatation. A 4.5 cm long, markedly thickened and inflamed segment of small bowel is seen within the anterior aspect of the mid abdomen. This is at the level the umbilicus and extends into a 2.9 cm x 1.1 cm left-sided para umbilical hernia. A 4.5 cm x 2.9 cm x 3.3 cm complex area of low attenuation (approximately 35.07 Hounsfield units), with thick, heterogeneous surrounding hyperdense wall, is seen just above the segment of inflamed small bowel. Marked severity surrounding inflammatory fat stranding is seen. A short segment of moderately thickened and inflamed mid transverse colon is seen just above this inflammatory process (best seen on sagittal reformatted images 63 through 74, CT series 6). Vascular/Lymphatic: No significant vascular findings are present. No enlarged abdominal or pelvic lymph nodes. Reproductive: Uterus and bilateral adnexa are unremarkable. Other: No abdominal wall hernia or abnormality. No abdominopelvic ascites. Musculoskeletal: Multilevel degenerative changes seen throughout the lumbar spine. IMPRESSION: 1. Markedly thickened and inflamed segment of small bowel within the mid abdomen, as described above, with an adjacent 4.5 cm x 2.9 cm x 3.3 cm complex inflammatory lesion likely representing early abscess formation. 2. Short segment of moderately thickened and inflamed mid transverse colon just above this inflammatory process. 3. 2 mm nonobstructing left renal calculus. 4. Evidence of prior cholecystectomy with mild central intrahepatic biliary dilatation and dilatation of the common bile duct. Correlation with liver enzymes is recommended. Electronically Signed   By: Aram Candela M.D.   On: 05/27/2022 19:06       Discharge Exam: Vitals:   05/28/22 2127 05/29/22 0456  BP: (!) 92/47 (!) 99/43  Pulse: 69 68  Resp: 18 18  Temp: 98.3  F (36.8 C) 98.2 F (36.8 C)  SpO2: 96% 98%    General: Pt is alert, awake, not in acute distress Cardiovascular: RRR, S1/S2 +, no edema Respiratory: CTA bilaterally, no wheezing, no rhonchi, no respiratory distress, no conversational dyspnea  Abdominal: Soft, NT, ND, bowel sounds + Extremities: no edema, no cyanosis Psych: Normal mood and affect, stable judgement and insight     The results of significant diagnostics from this hospitalization (including imaging, microbiology, ancillary and laboratory) are listed below for reference.     Microbiology: Recent Results (from the past 240 hour(s))  Blood Culture (routine x 2)     Status: None (Preliminary result)   Collection Time: 05/27/22 10:15 PM   Specimen: BLOOD  Result Value Ref Range Status   Specimen Description BLOOD LEFT ANTECUBITAL  Final   Special  Requests   Final    BOTTLES DRAWN AEROBIC AND ANAEROBIC Blood Culture adequate volume   Culture   Final    NO GROWTH 2 DAYS Performed at [facility], [address] [place], [place] [address]    Report Status PENDING  Incomplete  Blood Culture (routine x 2)     Status: None (Preliminary result)   Collection Time: 05/27/22 10:18 PM   Specimen: BLOOD  Result Value Ref Range Status   Specimen Description BLOOD BLOOD LEFT WRIST  Final   Special Requests   Final    BOTTLES DRAWN AEROBIC AND ANAEROBIC Blood Culture adequate volume   Culture   Final    NO GROWTH 2 DAYS Performed at [facility], [address], [place], [place] [address]    Report Status PENDING  Incomplete     Labs: BNP (last 3 results) No results for input(s): "BNP" in the last 8760 hours. Basic Metabolic Panel: Recent Labs  Lab 05/27/22 1548 05/27/22 2217 05/28/22 0428 05/29/22 0452  NA 134* 132* 133* 138  K 3.3* 3.6 3.4* 4.1  CL 96* 95* 99 102  CO2 25 23 22 25   GLUCOSE 107* 117* 91 132*  BUN 11 11 9 13   CREATININE 0.69 0.68 0.64 0.65  CALCIUM 9.0 8.8* 8.3* 8.6*  MG  --   --  1.7 1.9   PHOS  --   --  3.7  --    Liver Function Tests: Recent Labs  Lab 05/27/22 2217 05/28/22 0428  AST 16 15  ALT 16 13  ALKPHOS 61 54  BILITOT 1.4* 1.3*  PROT 7.5 6.3*  ALBUMIN 3.1* 2.7*   Recent Labs  Lab 05/27/22 2217  LIPASE 30   No results for input(s): "AMMONIA" in the last 168 hours. CBC: Recent Labs  Lab 05/27/22 1548 05/27/22 2217 05/28/22 0428 05/29/22 0452  WBC 9.8 9.8 9.1 8.1  NEUTROABS 7.6 8.3*  --   --   HGB 12.3 11.3* 10.9* 11.2*  HCT 36.9 34.4* 32.4* 34.8*  MCV 88.3 88.2 88.0 90.6  PLT 296 254 237 216   Cardiac Enzymes: No results for input(s): "CKTOTAL", "CKMB", "CKMBINDEX", "TROPONINI" in the last 168 hours. BNP: Invalid input(s): "POCBNP" CBG: Recent Labs  Lab 05/28/22 0334 05/28/22 0610 05/28/22 1159 05/28/22 1713 05/29/22 0846  GLUCAP 118* 85 104* 148* 127*   D-Dimer No results for input(s): "DDIMER" in the last 72 hours. Hgb A1c Recent Labs    05/27/22 2217  HGBA1C 7.3*   Lipid Profile No results for input(s): "CHOL", "HDL", "LDLCALC", "TRIG", "CHOLHDL", "LDLDIRECT" in the last 72 hours. Thyroid function studies No results for input(s): "TSH", "T4TOTAL", "T3FREE", "THYROIDAB" in the last 72 hours.  Invalid input(s): "FREET3" Anemia work up No results for input(s): "VITAMINB12", "FOLATE", "FERRITIN", "TIBC", "IRON", "RETICCTPCT" in the last 72 hours. Urinalysis    Component Value Date/Time   COLORURINE YELLOW 05/27/2022 2226   APPEARANCEUR CLEAR 05/27/2022 2226   LABSPEC 1.032 (H) 05/27/2022 2226   PHURINE 5.0 05/27/2022 2226   GLUCOSEU >=500 (A) 05/27/2022 2226   HGBUR MODERATE (A) 05/27/2022 2226   BILIRUBINUR NEGATIVE 05/27/2022 2226   BILIRUBINUR negative 04/15/2022 1343   KETONESUR 20 (A) 05/27/2022 2226   PROTEINUR NEGATIVE 05/27/2022 2226   UROBILINOGEN 1.0 04/15/2022 1343   NITRITE NEGATIVE 05/27/2022 2226   LEUKOCYTESUR NEGATIVE 05/27/2022 2226   Sepsis Labs Recent Labs  Lab 05/27/22 1548 05/27/22 2217  05/28/22 0428 05/29/22 0452  WBC 9.8 9.8 9.1 8.1   Microbiology Recent Results (  from the past 240 hour(s))  Blood Culture (routine x 2)     Status: None (Preliminary result)   Collection Time: 05/27/22 10:15 PM   Specimen: BLOOD  Result Value Ref Range Status   Specimen Description BLOOD LEFT ANTECUBITAL  Final   Special Requests   Final    BOTTLES DRAWN AEROBIC AND ANAEROBIC Blood Culture adequate volume   Culture   Final    NO GROWTH 2 DAYS Performed at [facility], [address] [place], [place] [address]    Report Status PENDING  Incomplete  Blood Culture (routine x 2)     Status: None (Preliminary result)   Collection Time: 05/27/22 10:18 PM   Specimen: BLOOD  Result Value Ref Range Status   Specimen Description BLOOD BLOOD LEFT WRIST  Final   Special Requests   Final    BOTTLES DRAWN AEROBIC AND ANAEROBIC Blood Culture adequate volume   Culture   Final    NO GROWTH 2 DAYS Performed at [facility], [address], [place], [place] [address]    Report Status PENDING  Incomplete     Patient was seen and examined on the day of discharge and was found to be in stable condition. Time coordinating discharge: 35 minutes including assessment and coordination of care, as well as examination of the patient.   SIGNED:  Dessa Phi, DO [facility] 05/29/2022, 10:43 AM

## 2022-05-29 NOTE — Progress Notes (Signed)
[facility] Progress Note     Subjective: Doing fair. Feeling ok. Eating.   Objective: Vital signs in last 24 hours: Temp:  [98.2 F (36.8 C)-98.8 F (37.1 C)] 98.2 F (36.8 C) (01/26 0456) Pulse Rate:  [68-74] 68 (01/26 0456) Resp:  [18-24] 18 (01/26 0456) BP: (92-113)/(43-65) 99/43 (01/26 0456) SpO2:  [96 %-98 %] 98 % (01/26 0456) Last BM Date : 05/27/22  Intake/Output from previous day: 01/25 0701 - 01/26 0700 In: 1121 [P.O.:920; IV Piggyback:201] Out: -  Intake/Output this shift: No intake/output data recorded.  General appearance: alert and no distress GI: periumbilical pain, no rebound or guarding Lab Results:  Recent Labs    05/28/22 0428 05/29/22 0452  WBC 9.1 8.1  HGB 10.9* 11.2*  HCT 32.4* 34.8*  PLT 237 216   BMET Recent Labs    05/28/22 0428 05/29/22 0452  NA 133* 138  K 3.4* 4.1  CL 99 102  CO2 22 25  GLUCOSE 91 132*  BUN 9 13  CREATININE 0.64 0.65  CALCIUM 8.3* 8.6*   PT/INR Recent Labs    05/27/22 2217  LABPROT 15.6*  INR 1.3*    Studies/Results: CT Abdomen Pelvis W Contrast  Result Date: 05/27/2022 CLINICAL DATA:  Lower abdominal pain with chills and fatigue. EXAM: CT ABDOMEN AND PELVIS WITH CONTRAST TECHNIQUE: Multidetector CT imaging of the abdomen and pelvis was performed using the standard protocol following bolus administration of intravenous contrast. RADIATION DOSE REDUCTION: This exam was performed according to the departmental dose-optimization program which includes automated exposure control, adjustment of the mA and/or kV according to patient size and/or use of iterative reconstruction technique. CONTRAST:  132mL OMNIPAQUE IOHEXOL 300 MG/ML  SOLN COMPARISON:  Sep 25, 2021 FINDINGS: Lower chest: No acute abnormality. Hepatobiliary: No focal liver abnormality is seen. Mild central intrahepatic biliary dilatation is noted. Status post cholecystectomy. The common bile duct is dilated and measures 11 mm in  diameter. Pancreas: Unremarkable. No pancreatic ductal dilatation or surrounding inflammatory changes. Spleen: Normal in size without focal abnormality. Adrenals/Urinary Tract: Adrenal glands are unremarkable. Kidneys are normal, without obstructing renal calculi, focal lesion, or hydronephrosis. A 2 mm nonobstructing renal calculus is seen within the mid left kidney. Bladder is unremarkable. Stomach/Bowel: Stomach is within normal limits. The appendix is not visualized. No evidence of bowel dilatation. A 4.5 cm long, markedly thickened and inflamed segment of small bowel is seen within the anterior aspect of the mid abdomen. This is at the level the umbilicus and extends into a 2.9 cm x 1.1 cm left-sided para umbilical hernia. A 4.5 cm x 2.9 cm x 3.3 cm complex area of low attenuation (approximately 35.07 Hounsfield units), with thick, heterogeneous surrounding hyperdense wall, is seen just above the segment of inflamed small bowel. Marked severity surrounding inflammatory fat stranding is seen. A short segment of moderately thickened and inflamed mid transverse colon is seen just above this inflammatory process (best seen on sagittal reformatted images 63 through 74, CT series 6). Vascular/Lymphatic: No significant vascular findings are present. No enlarged abdominal or pelvic lymph nodes. Reproductive: Uterus and bilateral adnexa are unremarkable. Other: No abdominal wall hernia or abnormality. No abdominopelvic ascites. Musculoskeletal: Multilevel degenerative changes seen throughout the lumbar spine. IMPRESSION: 1. Markedly thickened and inflamed segment of small bowel within the mid abdomen, as described above, with an adjacent 4.5 cm x 2.9 cm x 3.3 cm complex inflammatory lesion likely representing early abscess formation. 2. Short segment of moderately thickened and inflamed mid transverse colon  just above this inflammatory process. 3. 2 mm nonobstructing left renal calculus. 4. Evidence of prior  cholecystectomy with mild central intrahepatic biliary dilatation and dilatation of the common bile duct. Correlation with liver enzymes is recommended. Electronically Signed   By: Virgina Norfolk M.D.   On: 05/27/2022 19:06    Anti-infectives: Anti-infectives (From admission, onward)    Start     Dose/Rate Route Frequency Ordered Stop   05/28/22 0600  piperacillin-tazobactam (ZOSYN) IVPB 3.375 g        3.375 g 12.5 mL/hr over 240 Minutes Intravenous Every 8 hours 05/28/22 0450     05/27/22 2130  piperacillin-tazobactam (ZOSYN) IVPB 3.375 g        3.375 g 100 mL/hr over 30 Minutes Intravenous  Once 05/27/22 2120 05/27/22 2210       Assessment/Plan: Patient with abscess versus inflammation of the omentum of known etiology, will need colonoscopy as outpatient. CT next week. Finish antibiotics. Call if issues.  Future Appointments  Date Time Provider [place]  06/03/2022 11:30 AM AP-CT 1 AP-CT [place] [facility]  06/04/2022  2:45 PM Virl Cagey, MD RS-RS None  06/09/2022  2:40 PM Fayrene Helper, MD RPC-RPC [facility]  07/14/2022  9:40 AM GI-BCG MM 3 GI-BCGMM GI-BREAST CE     LOS: 2 days    Virl Cagey 05/29/2022

## 2022-05-31 DIAGNOSIS — R935 Abnormal findings on diagnostic imaging of other abdominal regions, including retroperitoneum: Secondary | ICD-10-CM | POA: Insufficient documentation

## 2022-05-31 DIAGNOSIS — R1030 Lower abdominal pain, unspecified: Secondary | ICD-10-CM | POA: Insufficient documentation

## 2022-05-31 NOTE — Assessment & Plan Note (Signed)
Pt to ED for admission and appropriate management

## 2022-05-31 NOTE — Assessment & Plan Note (Signed)
Acute onset , abnormal exam and history, suspicious for infected intestine, stat scan needed also labs ordered sta, CBC and diff and chem 7, will determine management based on scan result. Required hospitalization as early abscess a possibility

## 2022-05-31 NOTE — Progress Notes (Signed)
   Kelly Savage     MRN: [id number]      DOB: 1955-06-10   HPI Kelly Savage is herec/o  generalized abdominal pain, chills and night sweats which started on 05/24/2022, also c/o nausea, body aches and headahe , has a negative home covid test 1 day ago   ROS . Denies sinus pressure, nasal congestion, ear pain or sore throat. Denies chest congestion, productive cough or wheezing. Denies chest pains, palpitations and leg swelling    Denies dysuria, frequency, hesitancy or incontinence. Denies depression, anxiety or insomnia. Denies skin break down or rash.   PE  BP 120/77 (BP Location: Right Arm, Patient Position: Sitting, Cuff Size: Large)   Pulse (!) 105   Ht 5\' 8"  (1.727 m)   Wt 197 lb 1.9 oz (89.4 kg)   SpO2 98%   BMI 29.97 kg/m   Patient alert and oriented and in no cardiopulmonary distress.Uncomfortable due to acute illness  HEENT: No facial asymmetry, EOMI,     Neck supple .  Chest: Clear to auscultation bilaterally.  CVS: S1, S2 no murmurs, no S3.Regular rate.  ABD: tender maximally over periumbilical area, also tender wirh rebound in lower quadrants   Ext: No edema  MS: Adequate ROM spine, shoulders, hips and knees.  Skin: Intact, no ulcerations or rash noted.  Psych: Good eye contact, normal affect. Memory intact not anxious or depressed appearing.  CNS: CN 2-12 intact, power,  normal throughout.no focal deficits noted.   Assessment & Plan  Lower abdominal pain Acute onset , abnormal exam and history, suspicious for infected intestine, stat scan needed also labs ordered sta, CBC and diff and chem 7, will determine management based on scan result. Required hospitalization as early abscess a possibility  Abnormal abdominal CT scan Pt to ED for admission and appropriate management

## 2022-06-01 ENCOUNTER — Telehealth: Payer: Self-pay | Admitting: Family Medicine

## 2022-06-01 ENCOUNTER — Telehealth: Payer: Self-pay

## 2022-06-01 ENCOUNTER — Encounter (INDEPENDENT_AMBULATORY_CARE_PROVIDER_SITE_OTHER): Payer: Self-pay | Admitting: *Deleted

## 2022-06-01 LAB — CULTURE, BLOOD (ROUTINE X 2)
Culture: NO GROWTH
Culture: NO GROWTH
Special Requests: ADEQUATE
Special Requests: ADEQUATE

## 2022-06-01 NOTE — Telephone Encounter (Signed)
Transition Care Management Unsuccessful Follow-up Telephone Call  Date of discharge and from where:  1/26 Kelly Savage  Attempts:  1st Attempt  Reason for unsuccessful TCM follow-up call:  Left voice message

## 2022-06-01 NOTE — Telephone Encounter (Signed)
Transition Care Management Unsuccessful Follow-up Telephone Call  Date of discharge and from where:  [name] 05/29/2022  Attempts:  1st Attempt  Reason for unsuccessful TCM follow-up call:  Left voice message [name], [place] Direct Dial [phone]

## 2022-06-02 NOTE — Telephone Encounter (Signed)
error 

## 2022-06-02 NOTE — Telephone Encounter (Signed)
Transition Care Management Follow-up Telephone Call Date of discharge and from where: [facility] 05/29/2022 How have you been since you were released from the hospital? weak Any questions or concerns? No  Items Reviewed: Did the pt receive and understand the discharge instructions provided? Yes  Medications obtained and verified? Yes  Other? No  Any new allergies since your discharge? No  Dietary orders reviewed? Yes Do you have support at home? Yes   Home Care and Equipment/Supplies: Were home health services ordered? no If so, what is the name of the agency? N/a  Has the agency set up a time to come to the patient's home? no Were any new equipment or medical supplies ordered?  No What is the name of the medical supply agency? N/a Were you able to get the supplies/equipment? no Do you have any questions related to the use of the equipment or supplies? No  Functional Questionnaire: (I = Independent and D = Dependent) ADLs: I  Bathing/Dressing- I  Meal Prep- I  Eating- I  Maintaining continence- I  Transferring/Ambulation- I  Managing Meds- I  Follow up appointments reviewed:  PCP Hospital f/u appt confirmed? Yes  Scheduled to see Dr Moshe Cipro on 06/09/2022 @ 2:00. [facility] f/u appt confirmed? Yes  Scheduled to see Dr Constance Haw on 06/04/2022 @ 2:45. Are transportation arrangements needed? No  If their condition worsens, is the pt aware to call PCP or go to the Emergency Dept.? Yes Was the patient provided with contact information for the PCP's office or ED? Yes Was to pt encouraged to call back with questions or concerns? Yes [name], LPN [place] Direct Dial [phone]

## 2022-06-03 ENCOUNTER — Inpatient Hospital Stay (HOSPITAL_COMMUNITY): Admit: 2022-06-03 | Payer: 59

## 2022-06-03 ENCOUNTER — Encounter (HOSPITAL_COMMUNITY): Payer: Self-pay | Admitting: Radiology

## 2022-06-03 ENCOUNTER — Ambulatory Visit (HOSPITAL_COMMUNITY)
Admission: RE | Admit: 2022-06-03 | Discharge: 2022-06-03 | Disposition: A | Discharge status: Home / Self Care | Payer: 59 | Source: Ambulatory Visit | Attending: General Surgery | Admitting: General Surgery

## 2022-06-03 DIAGNOSIS — K651 Peritoneal abscess: Secondary | ICD-10-CM

## 2022-06-03 MED ORDER — IOHEXOL 300 MG/ML  SOLN
100.0000 mL | Freq: Once | INTRAMUSCULAR | Status: AC | PRN
  Administered 2022-06-03: 100 mL via INTRAVENOUS

## 2022-06-03 MED ORDER — IOHEXOL 9 MG/ML PO SOLN
ORAL | Status: AC
  Filled 2022-06-03: qty 1000

## 2022-06-04 ENCOUNTER — Ambulatory Visit: Payer: 59 | Admitting: General Surgery

## 2022-06-04 ENCOUNTER — Encounter: Payer: Self-pay | Admitting: General Surgery

## 2022-06-04 VITALS — BP 110/68 | HR 87 | Temp 98.5°F | Resp 12 | Ht 68.0 in | Wt 203.0 lb

## 2022-06-04 DIAGNOSIS — K651 Peritoneal abscess: Secondary | ICD-10-CM | POA: Diagnosis not present

## 2022-06-04 NOTE — Progress Notes (Signed)
[facility] History and Physical  Reason for Referral: Omental abscess/ phlegmon    Chief Complaint   Hospitalization Follow-up     Kelly Savage is a 67 y.o. female.  HPI: Kelly Savage says she is starting to feel a little better but has been weak. She has been on antibiotics. The CT was done yesterday and since that time she now has cellulitis on her abdominal wall.    Past Medical History:  Diagnosis Date   Ankle pain x3 months   bilateral   Diabetes mellitus type II 2006   Diabetes mellitus without complication ([place])    [employer]reesia 07/11/2020   Obesity all her life    lost 28 lbs in 14 weeks    Primary localized osteoarthritis of knee    Left   Primary localized osteoarthritis of left knee 09/23/2017   Right thigh pain    Thigh pain x 1 year    bilateral, esp when laying down, worse in the morning , and goes away with activity    Past Surgical History:  Procedure Laterality Date   APPENDECTOMY  2000   BOWEL RESECTION N/A 09/26/2021   Procedure: PARTIAL SMALL BOWEL RESECTION;  Surgeon: Aviva Signs, MD;  Location: [facility];  Service: General;  Laterality: N/A;   [place] N/A 09/26/2021   Procedure: HERNIORRHAPHY INCISIONAL;  Surgeon: Aviva Signs, MD;  Location: [facility];  Service: General;  Laterality: N/A;   TONSILLECTOMY     in childhood   TOTAL KNEE ARTHROPLASTY Left 10/18/2017   TOTAL KNEE ARTHROPLASTY Left 10/18/2017   Procedure: TOTAL KNEE ARTHROPLASTY;  Surgeon: Elsie Saas, MD;  Location: [place];  Service: Orthopedics;  Laterality: Left;    Family History  Problem Relation Age of Onset   Heart failure Mother 106       CVA (secondary to afib    Hyperlipidemia Father    Hypertension Father    Cancer Maternal Grandfather        colon    Diabetes Maternal Grandmother     Social History   Tobacco Use   Smoking status: Never   Smokeless tobacco: Never  Vaping Use   Vaping Use: Never  used  Substance Use Topics   Alcohol use: Not Currently   Drug use: Never    Medications: I have reviewed the patient's current medications. Allergies as of 06/04/2022   No Known Allergies      Medication List        Accurate as of June 04, 2022  2:40 PM. If you have any questions, ask your nurse or doctor.          amoxicillin-clavulanate 875-125 MG tablet Commonly known as: AUGMENTIN Take 1 tablet by mouth 2 (two) times daily for 7 days.   Aspirin Low Dose 81 MG tablet Generic drug: aspirin EC Take 1 tablet (81 mg total) by mouth daily.   benazepril 5 MG tablet Commonly known as: LOTENSIN Take 1 tablet (5 mg total) by mouth daily.   blood glucose meter kit and supplies Dispense based on patient and insurance preference. Three times daily testing dx e11.65   calcium carbonate 1500 (600 Ca) MG Tabs tablet Commonly known as: OSCAL Take 1,200 mg by mouth every evening.   diclofenac 75 MG EC tablet Commonly known as: VOLTAREN Take 1 tablet (75 mg total) by mouth daily as needed for arthritic pain   freestyle lancets Use as instructed to  test blood sugar once daily.   FREESTYLE LITE test strip Generic drug: glucose blood Use as instructed to test blood sugar once daily.   glipiZIDE 10 MG 24 hr tablet Commonly known as: GLUCOTROL XL Take 2 tablets (20 mg total) by mouth in the morning at breakfast   Janumet XR 50-1000 MG Tb24 Generic drug: SitaGLIPtin-MetFORMIN HCl Take 1 tablet by mouth 2 (two) times daily.   Jardiance 10 MG Tabs tablet Generic drug: empagliflozin Take 1 tablet (10 mg total) by mouth daily before breakfast.   rosuvastatin 20 MG tablet Commonly known as: Crestor Take 1 tablet (20 mg total) by mouth daily.         ROS:  A comprehensive review of systems was negative except for: Gastrointestinal: positive for abdominal pain and redness of skin of abdominal wall  Blood pressure 110/68, pulse 87, temperature 98.5 F (36.9 C),  temperature source Oral, resp. rate 12, height 5\' 8"  (1.727 m), weight 203 lb (92.1 kg), SpO2 96 %. Physical Exam Vitals reviewed.  HENT:     Head: Normocephalic.     Mouth/Throat:     Mouth: Mucous membranes are moist.  Eyes:     Pupils: Pupils are equal, round, and reactive to light.  Cardiovascular:     Rate and Rhythm: Normal rate and regular rhythm.  Pulmonary:     Effort: Pulmonary effort is normal.     Breath sounds: Normal breath sounds.  Abdominal:     General: There is no distension.     Palpations: Abdomen is soft.     Tenderness: There is abdominal tenderness.     Comments: Redness left of umbilicus   Musculoskeletal:        General: No swelling.     Cervical back: Normal range of motion.  Skin:    General: Skin is warm.  Neurological:     General: No focal deficit present.     Mental Status: She is alert.  Psychiatric:        Mood and Affect: Mood normal.        Behavior: Behavior normal.     Results: Personally reviewed and discussed with radiology- omental phlegmon increased in size, bowel with contrast, no obvious extravasation, small dot on sagittal but radiology thinks more likely out of plane of view inside bowel and not a fistula, some colon distention   CT Abdomen Pelvis W Contrast  Result Date: 06/03/2022 CLINICAL DATA:  Intra-abdominal abscess follow-up with continued abdominal tenderness EXAM: CT ABDOMEN AND PELVIS WITH CONTRAST TECHNIQUE: Multidetector CT imaging of the abdomen and pelvis was performed using the standard protocol following bolus administration of intravenous contrast. RADIATION DOSE REDUCTION: This exam was performed according to the departmental dose-optimization program which includes automated exposure control, adjustment of the mA and/or kV according to patient size and/or use of iterative reconstruction technique. CONTRAST:  119mL OMNIPAQUE IOHEXOL 300 MG/ML  SOLN COMPARISON:  CT 05/27/2022 FINDINGS: Lower chest: No acute  abnormality. Hepatobiliary: Unchanged post cholecystectomy intra and extrahepatic biliary dilation. No solid hepatic lesion. Pancreas: Unremarkable. Spleen: Unremarkable. Adrenals/Urinary Tract: Normal adrenal glands. Tiny nonobstructing left calyceal stone. No hydronephrosis. Unremarkable bladder. Stomach/Bowel: Normal caliber large and small bowel. Colonic diverticulosis without diverticulitis. Mild thickening of the transverse colon wall near the omental abscess, likely reactive. Decreased wall thickening of the inflamed segment of small bowel seen within the anterior aspect of the mid abdomen where it is herniated into the periumbilical hernia. No evidence of obstruction. The appendix is not definitively visualized.  Unremarkable stomach. Enteric contrast is visualized throughout the distal small bowel and in the cecum. Vascular/Lymphatic: No significant vascular findings are present. No enlarged abdominal or pelvic lymph nodes. Reproductive: Unremarkable. Other: A 4.1 x 6.0 cm complex area of low attenuation is seen within the omentum/anterior abdominal wall near the periumbilical hernia. This is similar to slightly increased in size from 05/27/2022. There is a thick enhancing wall about the fluid collection. Adjacent omental and subcutaneous fat stranding is similar. Musculoskeletal: Thoracolumbar curve. Thoracolumbar spondylosis with advanced degenerative disc disease at L1-L2 and L3-L4. No acute fracture. IMPRESSION: 1. Slightly increased size of the complex inflammatory phlegmon in the omentum/anterior abdominal wall. 2. Similar enteritis/colitis about the omental abscess in the midline abdominal wall. Electronically Signed   By: Placido Sou M.D.   On: 06/03/2022 22:17     Assessment & Plan:  Kelly Savage is a 68 y.o. female with a worsening omental phlegmon/ abscess. Now with abdominal wall cellulitis on antibiotics. She needs exploration and omentectomy, possible bowel resection. Discussed risk of  bleeding, infection, resection of bowel ,hernia formation, and fact she has been on her diabetes meds and will need to stay overnight due to risk of issues with ketoacidosis related to the medications and NPO.    NPO midnight Labs and EKG done last week Updated anesthesia regarding case tomorrow Cannot be delayed the 72 hours for the medications due to the risk of worsening infection  All questions were answered to the satisfaction of the patient.  Will be out of work likely 6 weeks from last week.   Virl Cagey 06/04/2022, 2:40 PM

## 2022-06-04 NOTE — H&P (Signed)
[facility] History and Physical  Reason for Referral: Omental abscess/ phlegmon    Chief Complaint   Hospitalization Follow-up     Kelly Savage is a 66 y.o. female.  HPI: Kelly Savage says she is starting to feel a little better but has been weak. She has been on antibiotics. The CT was done yesterday and since that time she now has cellulitis on her abdominal wall.    Past Medical History:  Diagnosis Date   Ankle pain x3 months   bilateral   Diabetes mellitus type II 2006   Diabetes mellitus without complication ([facility])    Phreesia 07/11/2020   Obesity all her life    lost 28 lbs in 14 weeks    Primary localized osteoarthritis of knee    Left   Primary localized osteoarthritis of left knee 09/23/2017   Right thigh pain    Thigh pain x 1 year    bilateral, esp when laying down, worse in the morning , and goes away with activity    Past Surgical History:  Procedure Laterality Date   APPENDECTOMY  2000   BOWEL RESECTION N/A 09/26/2021   Procedure: PARTIAL SMALL BOWEL RESECTION;  Surgeon: Jenkins, Mark, MD;  Location: [facility];  Service: General;  Laterality: N/A;   Cholecysectomy  1998   CHOLECYSTECTOMY     INCISIONAL HERNIA REPAIR N/A 09/26/2021   Procedure: HERNIORRHAPHY INCISIONAL;  Surgeon: Jenkins, Mark, MD;  Location: [facility];  Service: General;  Laterality: N/A;   TONSILLECTOMY     in childhood   TOTAL KNEE ARTHROPLASTY Left 10/18/2017   TOTAL KNEE ARTHROPLASTY Left 10/18/2017   Procedure: TOTAL KNEE ARTHROPLASTY;  Surgeon: Wainer, Robert, MD;  Location: [facility];  Service: Orthopedics;  Laterality: Left;    Family History  Problem Relation Age of Onset   Heart failure Mother 76       CVA (secondary to afib    Hyperlipidemia Father    Hypertension Father    Cancer Maternal Grandfather        colon    Diabetes Maternal Grandmother     Social History   Tobacco Use   Smoking status: Never   Smokeless tobacco: Never  Vaping Use   Vaping Use: Never  used  Substance Use Topics   Alcohol use: Not Currently   Drug use: Never    Medications: I have reviewed the patient's current medications. Allergies as of 06/04/2022   No Known Allergies      Medication List        Accurate as of June 04, 2022  2:40 PM. If you have any questions, ask your nurse or doctor.          amoxicillin-clavulanate 875-125 MG tablet Commonly known as: AUGMENTIN Take 1 tablet by mouth 2 (two) times daily for 7 days.   Aspirin Low Dose 81 MG tablet Generic drug: aspirin EC Take 1 tablet (81 mg total) by mouth daily.   benazepril 5 MG tablet Commonly known as: LOTENSIN Take 1 tablet (5 mg total) by mouth daily.   blood glucose meter kit and supplies Dispense based on patient and insurance preference. Three times daily testing dx e11.65   calcium carbonate 1500 (600 Ca) MG Tabs tablet Commonly known as: OSCAL Take 1,200 mg by mouth every evening.   diclofenac 75 MG EC tablet Commonly known as: VOLTAREN Take 1 tablet (75 mg total) by mouth daily as needed for arthritic pain   freestyle lancets Use as instructed to   test blood sugar once daily.   FREESTYLE LITE test strip Generic drug: glucose blood Use as instructed to test blood sugar once daily.   glipiZIDE 10 MG 24 hr tablet Commonly known as: GLUCOTROL XL Take 2 tablets (20 mg total) by mouth in the morning at breakfast   Janumet XR 50-1000 MG Tb24 Generic drug: SitaGLIPtin-MetFORMIN HCl Take 1 tablet by mouth 2 (two) times daily.   Jardiance 10 MG Tabs tablet Generic drug: empagliflozin Take 1 tablet (10 mg total) by mouth daily before breakfast.   rosuvastatin 20 MG tablet Commonly known as: Crestor Take 1 tablet (20 mg total) by mouth daily.         ROS:  A comprehensive review of systems was negative except for: Gastrointestinal: positive for abdominal pain and redness of skin of abdominal wall  Blood pressure 110/68, pulse 87, temperature 98.5 F (36.9 C),  temperature source Oral, resp. rate 12, height 5\' 8"  (1.727 m), weight 203 lb (92.1 kg), SpO2 96 %. Physical Exam Vitals reviewed.  HENT:     Head: Normocephalic.     Mouth/Throat:     Mouth: Mucous membranes are moist.  Eyes:     Pupils: Pupils are equal, round, and reactive to light.  Cardiovascular:     Rate and Rhythm: Normal rate and regular rhythm.  Pulmonary:     Effort: Pulmonary effort is normal.     Breath sounds: Normal breath sounds.  Abdominal:     General: There is no distension.     Palpations: Abdomen is soft.     Tenderness: There is abdominal tenderness.     Comments: Redness left of umbilicus   Musculoskeletal:        General: No swelling.     Cervical back: Normal range of motion.  Skin:    General: Skin is warm.  Neurological:     General: No focal deficit present.     Mental Status: She is alert.  Psychiatric:        Mood and Affect: Mood normal.        Behavior: Behavior normal.     Results: Personally reviewed and discussed with radiology- omental phlegmon increased in size, bowel with contrast, no obvious extravasation, small dot on sagittal but radiology thinks more likely out of plane of view inside bowel and not a fistula, some colon distention   CT Abdomen Pelvis W Contrast  Result Date: 06/03/2022 CLINICAL DATA:  Intra-abdominal abscess follow-up with continued abdominal tenderness EXAM: CT ABDOMEN AND PELVIS WITH CONTRAST TECHNIQUE: Multidetector CT imaging of the abdomen and pelvis was performed using the standard protocol following bolus administration of intravenous contrast. RADIATION DOSE REDUCTION: This exam was performed according to the departmental dose-optimization program which includes automated exposure control, adjustment of the mA and/or kV according to patient size and/or use of iterative reconstruction technique. CONTRAST:  119mL OMNIPAQUE IOHEXOL 300 MG/ML  SOLN COMPARISON:  CT 05/27/2022 FINDINGS: Lower chest: No acute  abnormality. Hepatobiliary: Unchanged post cholecystectomy intra and extrahepatic biliary dilation. No solid hepatic lesion. Pancreas: Unremarkable. Spleen: Unremarkable. Adrenals/Urinary Tract: Normal adrenal glands. Tiny nonobstructing left calyceal stone. No hydronephrosis. Unremarkable bladder. Stomach/Bowel: Normal caliber large and small bowel. Colonic diverticulosis without diverticulitis. Mild thickening of the transverse colon wall near the omental abscess, likely reactive. Decreased wall thickening of the inflamed segment of small bowel seen within the anterior aspect of the mid abdomen where it is herniated into the periumbilical hernia. No evidence of obstruction. The appendix is not definitively visualized.  Unremarkable stomach. Enteric contrast is visualized throughout the distal small bowel and in the cecum. Vascular/Lymphatic: No significant vascular findings are present. No enlarged abdominal or pelvic lymph nodes. Reproductive: Unremarkable. Other: A 4.1 x 6.0 cm complex area of low attenuation is seen within the omentum/anterior abdominal wall near the periumbilical hernia. This is similar to slightly increased in size from 05/27/2022. There is a thick enhancing wall about the fluid collection. Adjacent omental and subcutaneous fat stranding is similar. Musculoskeletal: Thoracolumbar curve. Thoracolumbar spondylosis with advanced degenerative disc disease at L1-L2 and L3-L4. No acute fracture. IMPRESSION: 1. Slightly increased size of the complex inflammatory phlegmon in the omentum/anterior abdominal wall. 2. Similar enteritis/colitis about the omental abscess in the midline abdominal wall. Electronically Signed   By: Tyler  Stutzman M.D.   On: 06/03/2022 22:17     Assessment & Plan:  Kelly Savage is a 66 y.o. female with a worsening omental phlegmon/ abscess. Now with abdominal wall cellulitis on antibiotics. She needs exploration and omentectomy, possible bowel resection. Discussed risk of  bleeding, infection, resection of bowel ,hernia formation, and fact she has been on her diabetes meds and will need to stay overnight due to risk of issues with ketoacidosis related to the medications and NPO.    NPO midnight Labs and EKG done last week Updated anesthesia regarding case tomorrow Cannot be delayed the 72 hours for the medications due to the risk of worsening infection  All questions were answered to the satisfaction of the patient.  Will be out of work likely 6 weeks from last week.   Crit Obremski C Nana Hoselton 06/04/2022, 2:40 PM       

## 2022-06-04 NOTE — Patient Instructions (Signed)
Plan for exploration and removal of the omentum tomorrow, possible bowel resection.  Arrive to the main entrance 8:30PM.

## 2022-06-05 ENCOUNTER — Telehealth: Payer: Self-pay | Admitting: Family Medicine

## 2022-06-05 ENCOUNTER — Other Ambulatory Visit: Payer: Self-pay

## 2022-06-05 ENCOUNTER — Encounter (HOSPITAL_COMMUNITY): Payer: Self-pay | Admitting: General Surgery

## 2022-06-05 ENCOUNTER — Ambulatory Visit (HOSPITAL_COMMUNITY): Payer: 59 | Admitting: Anesthesiology

## 2022-06-05 ENCOUNTER — Encounter (HOSPITAL_COMMUNITY)
Admission: RE | Disposition: A | Discharge status: Home / Self Care | Payer: Self-pay | Source: Home / Self Care | Attending: General Surgery

## 2022-06-05 ENCOUNTER — Inpatient Hospital Stay (HOSPITAL_COMMUNITY)
Admission: RE | Admit: 2022-06-05 | Discharge: 2022-06-09 | DRG: 329 | Disposition: A | Discharge status: Home / Self Care | Payer: 59 | Attending: General Surgery | Admitting: General Surgery

## 2022-06-05 ENCOUNTER — Inpatient Hospital Stay (HOSPITAL_COMMUNITY): Payer: 59

## 2022-06-05 DIAGNOSIS — E119 Type 2 diabetes mellitus without complications: Secondary | ICD-10-CM | POA: Diagnosis not present

## 2022-06-05 DIAGNOSIS — Z83438 Family history of other disorder of lipoprotein metabolism and other lipidemia: Secondary | ICD-10-CM

## 2022-06-05 DIAGNOSIS — E876 Hypokalemia: Secondary | ICD-10-CM | POA: Diagnosis not present

## 2022-06-05 DIAGNOSIS — Z8249 Family history of ischemic heart disease and other diseases of the circulatory system: Secondary | ICD-10-CM | POA: Diagnosis not present

## 2022-06-05 DIAGNOSIS — Z4682 Encounter for fitting and adjustment of non-vascular catheter: Secondary | ICD-10-CM | POA: Diagnosis not present

## 2022-06-05 DIAGNOSIS — K651 Peritoneal abscess: Secondary | ICD-10-CM

## 2022-06-05 DIAGNOSIS — K632 Fistula of intestine: Principal | ICD-10-CM | POA: Diagnosis present

## 2022-06-05 DIAGNOSIS — Z809 Family history of malignant neoplasm, unspecified: Secondary | ICD-10-CM | POA: Diagnosis not present

## 2022-06-05 DIAGNOSIS — K660 Peritoneal adhesions (postprocedural) (postinfection): Secondary | ICD-10-CM | POA: Diagnosis not present

## 2022-06-05 DIAGNOSIS — Z7984 Long term (current) use of oral hypoglycemic drugs: Secondary | ICD-10-CM

## 2022-06-05 DIAGNOSIS — K631 Perforation of intestine (nontraumatic): Secondary | ICD-10-CM | POA: Diagnosis present

## 2022-06-05 DIAGNOSIS — Z823 Family history of stroke: Secondary | ICD-10-CM

## 2022-06-05 DIAGNOSIS — Z96652 Presence of left artificial knee joint: Secondary | ICD-10-CM | POA: Diagnosis present

## 2022-06-05 DIAGNOSIS — Z9049 Acquired absence of other specified parts of digestive tract: Secondary | ICD-10-CM | POA: Diagnosis not present

## 2022-06-05 DIAGNOSIS — Z833 Family history of diabetes mellitus: Secondary | ICD-10-CM | POA: Diagnosis not present

## 2022-06-05 DIAGNOSIS — L03311 Cellulitis of abdominal wall: Secondary | ICD-10-CM | POA: Diagnosis present

## 2022-06-05 DIAGNOSIS — B999 Unspecified infectious disease: Secondary | ICD-10-CM | POA: Diagnosis present

## 2022-06-05 DIAGNOSIS — K658 Other peritonitis: Secondary | ICD-10-CM | POA: Diagnosis not present

## 2022-06-05 DIAGNOSIS — K668 Other specified disorders of peritoneum: Secondary | ICD-10-CM | POA: Diagnosis not present

## 2022-06-05 DIAGNOSIS — K641 Second degree hemorrhoids: Secondary | ICD-10-CM

## 2022-06-05 HISTORY — PX: BOWEL RESECTION: SHX1257

## 2022-06-05 HISTORY — PX: LAPAROTOMY: SHX154

## 2022-06-05 LAB — GLUCOSE, CAPILLARY
Glucose-Capillary: 116 mg/dL — ABNORMAL HIGH (ref 70–99)
Glucose-Capillary: 94 mg/dL (ref 70–99)
Glucose-Capillary: 121 mg/dL — ABNORMAL HIGH (ref 70–99)
Glucose-Capillary: 144 mg/dL — ABNORMAL HIGH (ref 70–99)
Glucose-Capillary: 121 mg/dL — ABNORMAL HIGH (ref 70–99)

## 2022-06-05 LAB — TYPE AND SCREEN
ABO/RH(D): A NEG
Antibody Screen: NEGATIVE

## 2022-06-05 LAB — ABO/RH: ABO/RH(D): A NEG

## 2022-06-05 SURGERY — LAPAROTOMY, EXPLORATORY
Anesthesia: General | Site: Abdomen

## 2022-06-05 MED ORDER — MEPERIDINE HCL 50 MG/ML IJ SOLN: 6.2500 mg | INTRAMUSCULAR | Status: DC | PRN

## 2022-06-05 MED ORDER — BUPIVACAINE LIPOSOME 1.3 % IJ SUSP
INTRAMUSCULAR | Status: AC
  Filled 2022-06-05: qty 20

## 2022-06-05 MED ORDER — METRONIDAZOLE 500 MG/100ML IV SOLN
500.0000 mg | INTRAVENOUS | Status: AC
  Administered 2022-06-05: 500 mg via INTRAVENOUS

## 2022-06-05 MED ORDER — DEXTROSE 50 % IV SOLN
INTRAVENOUS | Status: AC
  Filled 2022-06-05: qty 50

## 2022-06-05 MED ORDER — ONDANSETRON HCL 4 MG/2ML IJ SOLN
4.0000 mg | Freq: Four times a day (QID) | INTRAMUSCULAR | Status: DC | PRN
  Administered 2022-06-06 – 2022-06-08 (×5): 4 mg via INTRAVENOUS
  Filled 2022-06-05 (×5): qty 2

## 2022-06-05 MED ORDER — ONDANSETRON HCL 4 MG/2ML IJ SOLN
4.0000 mg | Freq: Once | INTRAMUSCULAR | Status: AC | PRN
  Filled 2022-06-05: qty 2

## 2022-06-05 MED ORDER — BENAZEPRIL HCL 5 MG PO TABS
5.0000 mg | ORAL_TABLET | Freq: Every day | ORAL | Status: DC
  Administered 2022-06-06 – 2022-06-09 (×4): 5 mg via ORAL
  Filled 2022-06-05 (×4): qty 1

## 2022-06-05 MED ORDER — SUCCINYLCHOLINE CHLORIDE 200 MG/10ML IV SOSY
PREFILLED_SYRINGE | INTRAVENOUS | Status: DC | PRN
  Administered 2022-06-05: 120 mg via INTRAVENOUS

## 2022-06-05 MED ORDER — CHLORHEXIDINE GLUCONATE CLOTH 2 % EX PADS: 6.0000 | MEDICATED_PAD | Freq: Once | CUTANEOUS | Status: DC

## 2022-06-05 MED ORDER — CIPROFLOXACIN IN D5W 400 MG/200ML IV SOLN
400.0000 mg | Freq: Two times a day (BID) | INTRAVENOUS | Status: DC
  Administered 2022-06-05 – 2022-06-09 (×8): 400 mg via INTRAVENOUS
  Filled 2022-06-05 (×8): qty 200

## 2022-06-05 MED ORDER — ROCURONIUM BROMIDE 100 MG/10ML IV SOLN
INTRAVENOUS | Status: DC | PRN
  Administered 2022-06-05: 40 mg via INTRAVENOUS
  Administered 2022-06-05: 20 mg via INTRAVENOUS

## 2022-06-05 MED ORDER — SODIUM CHLORIDE 0.9 % IR SOLN
Status: DC | PRN
  Administered 2022-06-05 (×4): 1000 mL

## 2022-06-05 MED ORDER — LIDOCAINE HCL (PF) 2 % IJ SOLN
INTRAMUSCULAR | Status: AC
  Filled 2022-06-05: qty 5

## 2022-06-05 MED ORDER — SUGAMMADEX SODIUM 200 MG/2ML IV SOLN
INTRAVENOUS | Status: DC | PRN
  Administered 2022-06-05: 200 mg via INTRAVENOUS

## 2022-06-05 MED ORDER — ORAL CARE MOUTH RINSE: 15.0000 mL | Freq: Once | OROMUCOSAL | Status: DC

## 2022-06-05 MED ORDER — CIPROFLOXACIN IN D5W 400 MG/200ML IV SOLN
INTRAVENOUS | Status: AC
  Filled 2022-06-05: qty 200

## 2022-06-05 MED ORDER — SIMETHICONE 80 MG PO CHEW
40.0000 mg | CHEWABLE_TABLET | Freq: Four times a day (QID) | ORAL | Status: DC | PRN
  Administered 2022-06-06 (×2): 40 mg via ORAL
  Filled 2022-06-05 (×3): qty 1

## 2022-06-05 MED ORDER — LIDOCAINE HCL (PF) 2 % IJ SOLN
INTRAMUSCULAR | Status: AC
  Filled 2022-06-05: qty 15

## 2022-06-05 MED ORDER — METOPROLOL TARTRATE 5 MG/5ML IV SOLN: 5.0000 mg | Freq: Four times a day (QID) | INTRAVENOUS | Status: DC | PRN

## 2022-06-05 MED ORDER — CHLORHEXIDINE GLUCONATE 0.12 % MT SOLN
OROMUCOSAL | Status: AC
  Filled 2022-06-05: qty 15

## 2022-06-05 MED ORDER — ONDANSETRON HCL 4 MG/2ML IJ SOLN
INTRAMUSCULAR | Status: DC | PRN
  Administered 2022-06-05: 4 mg via INTRAVENOUS

## 2022-06-05 MED ORDER — HYDROMORPHONE HCL 1 MG/ML IJ SOLN
0.2500 mg | INTRAMUSCULAR | Status: DC | PRN
  Administered 2022-06-05: 0.5 mg via INTRAVENOUS
  Filled 2022-06-05: qty 0.5

## 2022-06-05 MED ORDER — METRONIDAZOLE 500 MG/100ML IV SOLN
INTRAVENOUS | Status: AC
  Filled 2022-06-05: qty 100

## 2022-06-05 MED ORDER — KETOROLAC TROMETHAMINE 30 MG/ML IJ SOLN
INTRAMUSCULAR | Status: AC
  Filled 2022-06-05: qty 1

## 2022-06-05 MED ORDER — PANTOPRAZOLE SODIUM 40 MG IV SOLR
40.0000 mg | Freq: Every day | INTRAVENOUS | Status: DC
  Administered 2022-06-05 – 2022-06-06 (×2): 40 mg via INTRAVENOUS
  Filled 2022-06-05 (×2): qty 10

## 2022-06-05 MED ORDER — DIPHENHYDRAMINE HCL 12.5 MG/5ML PO ELIX: 12.5000 mg | ORAL_SOLUTION | Freq: Four times a day (QID) | ORAL | Status: DC | PRN

## 2022-06-05 MED ORDER — METOCLOPRAMIDE HCL 5 MG/ML IJ SOLN
INTRAMUSCULAR | Status: DC | PRN
  Administered 2022-06-05: 10 mg via INTRAVENOUS

## 2022-06-05 MED ORDER — ONDANSETRON HCL 4 MG/2ML IJ SOLN
INTRAMUSCULAR | Status: AC
  Filled 2022-06-05: qty 2

## 2022-06-05 MED ORDER — DEXTROSE 50 % IV SOLN
25.0000 mL | Freq: Once | INTRAVENOUS | Status: AC
  Administered 2022-06-05: 25 mL via INTRAVENOUS

## 2022-06-05 MED ORDER — PROPOFOL 10 MG/ML IV BOLUS
INTRAVENOUS | Status: DC | PRN
  Administered 2022-06-05: 130 mg via INTRAVENOUS

## 2022-06-05 MED ORDER — LACTATED RINGERS IV SOLN: INTRAVENOUS | Status: DC

## 2022-06-05 MED ORDER — FENTANYL CITRATE (PF) 100 MCG/2ML IJ SOLN
INTRAMUSCULAR | Status: DC | PRN
  Administered 2022-06-05 (×4): 50 ug via INTRAVENOUS

## 2022-06-05 MED ORDER — DEXTROSE IN LACTATED RINGERS 5 % IV SOLN: INTRAVENOUS | Status: DC

## 2022-06-05 MED ORDER — BUPIVACAINE LIPOSOME 1.3 % IJ SUSP
INTRAMUSCULAR | Status: DC | PRN
  Administered 2022-06-05: 20 mL

## 2022-06-05 MED ORDER — ROCURONIUM BROMIDE 10 MG/ML (PF) SYRINGE
PREFILLED_SYRINGE | INTRAVENOUS | Status: AC
  Filled 2022-06-05: qty 10

## 2022-06-05 MED ORDER — DICLOFENAC SODIUM 75 MG PO TBEC: 75.0000 mg | DELAYED_RELEASE_TABLET | Freq: Every day | ORAL | Status: DC | PRN

## 2022-06-05 MED ORDER — FENTANYL CITRATE (PF) 100 MCG/2ML IJ SOLN
INTRAMUSCULAR | Status: AC
  Filled 2022-06-05: qty 2

## 2022-06-05 MED ORDER — INSULIN ASPART 100 UNIT/ML IJ SOLN
0.0000 [IU] | INTRAMUSCULAR | Status: DC
  Administered 2022-06-05 (×2): 2 [IU] via SUBCUTANEOUS
  Administered 2022-06-06: 3 [IU] via SUBCUTANEOUS
  Administered 2022-06-06 (×5): 2 [IU] via SUBCUTANEOUS
  Administered 2022-06-07: 3 [IU] via SUBCUTANEOUS
  Administered 2022-06-07: 2 [IU] via SUBCUTANEOUS
  Administered 2022-06-07 – 2022-06-08 (×5): 3 [IU] via SUBCUTANEOUS
  Administered 2022-06-08: 2 [IU] via SUBCUTANEOUS
  Administered 2022-06-08: 5 [IU] via SUBCUTANEOUS
  Administered 2022-06-08: 3 [IU] via SUBCUTANEOUS
  Administered 2022-06-08 (×2): 2 [IU] via SUBCUTANEOUS
  Administered 2022-06-09 (×2): 5 [IU] via SUBCUTANEOUS
  Administered 2022-06-09 (×2): 3 [IU] via SUBCUTANEOUS

## 2022-06-05 MED ORDER — METOCLOPRAMIDE HCL 5 MG/ML IJ SOLN
INTRAMUSCULAR | Status: AC
  Filled 2022-06-05: qty 2

## 2022-06-05 MED ORDER — CHLORHEXIDINE GLUCONATE 0.12 % MT SOLN: 15.0000 mL | Freq: Once | OROMUCOSAL | Status: DC

## 2022-06-05 MED ORDER — HEPARIN SODIUM (PORCINE) 5000 UNIT/ML IJ SOLN
5000.0000 [IU] | Freq: Three times a day (TID) | INTRAMUSCULAR | Status: DC
  Administered 2022-06-06 – 2022-06-09 (×11): 5000 [IU] via SUBCUTANEOUS
  Filled 2022-06-05 (×11): qty 1

## 2022-06-05 MED ORDER — ONDANSETRON HCL 4 MG/2ML IJ SOLN
INTRAMUSCULAR | Status: AC
  Administered 2022-06-05: 4 mg via INTRAVENOUS
  Filled 2022-06-05: qty 2

## 2022-06-05 MED ORDER — SUCCINYLCHOLINE CHLORIDE 200 MG/10ML IV SOSY
PREFILLED_SYRINGE | INTRAVENOUS | Status: AC
  Filled 2022-06-05: qty 10

## 2022-06-05 MED ORDER — LIDOCAINE HCL (CARDIAC) PF 100 MG/5ML IV SOSY
PREFILLED_SYRINGE | INTRAVENOUS | Status: DC | PRN
  Administered 2022-06-05: 80 mg via INTRAVENOUS

## 2022-06-05 MED ORDER — ONDANSETRON 4 MG PO TBDP: 4.0000 mg | ORAL_TABLET | Freq: Four times a day (QID) | ORAL | Status: DC | PRN

## 2022-06-05 MED ORDER — METRONIDAZOLE 500 MG/100ML IV SOLN
500.0000 mg | Freq: Two times a day (BID) | INTRAVENOUS | Status: DC
  Administered 2022-06-05 – 2022-06-09 (×8): 500 mg via INTRAVENOUS
  Filled 2022-06-05 (×8): qty 100

## 2022-06-05 MED ORDER — PROPOFOL 10 MG/ML IV BOLUS
INTRAVENOUS | Status: AC
  Filled 2022-06-05: qty 20

## 2022-06-05 MED ORDER — CIPROFLOXACIN IN D5W 400 MG/200ML IV SOLN
400.0000 mg | INTRAVENOUS | Status: AC
  Administered 2022-06-05: 400 mg via INTRAVENOUS

## 2022-06-05 MED ORDER — DIPHENHYDRAMINE HCL 50 MG/ML IJ SOLN: 12.5000 mg | Freq: Four times a day (QID) | INTRAMUSCULAR | Status: DC | PRN

## 2022-06-05 MED ORDER — MORPHINE SULFATE (PF) 2 MG/ML IV SOLN
2.0000 mg | INTRAVENOUS | Status: DC | PRN
  Administered 2022-06-05 – 2022-06-06 (×9): 2 mg via INTRAVENOUS
  Filled 2022-06-05 (×9): qty 1

## 2022-06-05 SURGICAL SUPPLY — 57 items
BNDG GAUZE ROLL STR 2.25X3YD (GAUZE/BANDAGES/DRESSINGS) IMPLANT
CHLORAPREP W/TINT 26 (MISCELLANEOUS) ×2 IMPLANT
CLOTH BEACON ORANGE TIMEOUT ST (SAFETY) ×2 IMPLANT
COVER LIGHT HANDLE STERIS (MISCELLANEOUS) ×4 IMPLANT
DRAPE WARM FLUID 44X44 (DRAPES) ×2 IMPLANT
DRSG OPSITE POSTOP 4X10 (GAUZE/BANDAGES/DRESSINGS) IMPLANT
DRSG OPSITE POSTOP 4X8 (GAUZE/BANDAGES/DRESSINGS) IMPLANT
ELECT BLADE 6 FLAT ULTRCLN (ELECTRODE) IMPLANT
ELECT REM PT RETURN 9FT ADLT (ELECTROSURGICAL) ×2
ELECTRODE REM PT RTRN 9FT ADLT (ELECTROSURGICAL) ×2 IMPLANT
GLOVE BIO SURGEON STRL SZ 6.5 (GLOVE) ×2 IMPLANT
GLOVE BIOGEL PI IND STRL 6.5 (GLOVE) ×2 IMPLANT
GLOVE BIOGEL PI IND STRL 7.0 (GLOVE) ×4 IMPLANT
GOWN STRL REUS W/TWL LRG LVL3 (GOWN DISPOSABLE) ×6 IMPLANT
HANDLE SUCTION POOLE (INSTRUMENTS) ×2 IMPLANT
INST SET MAJOR GENERAL (KITS) ×2 IMPLANT
KIT REMOVER STAPLE SKIN (MISCELLANEOUS) IMPLANT
KIT TURNOVER KIT A (KITS) ×2 IMPLANT
LIGASURE IMPACT 36 18CM CVD LR (INSTRUMENTS) IMPLANT
MANIFOLD NEPTUNE II (INSTRUMENTS) ×2 IMPLANT
NDL HYPO 18GX1.5 BLUNT FILL (NEEDLE) ×2 IMPLANT
NDL HYPO 21X1.5 SAFETY (NEEDLE) ×2 IMPLANT
NEEDLE HYPO 18GX1.5 BLUNT FILL (NEEDLE) ×2 IMPLANT
NEEDLE HYPO 21X1.5 SAFETY (NEEDLE) ×2 IMPLANT
NS IRRIG 1000ML POUR BTL (IV SOLUTION) ×4 IMPLANT
PACK MAJOR ABDOMINAL (CUSTOM PROCEDURE TRAY) ×2 IMPLANT
PAD ABD 5X9 TENDERSORB (GAUZE/BANDAGES/DRESSINGS) IMPLANT
PAD ARMBOARD 7.5X6 YLW CONV (MISCELLANEOUS) ×2 IMPLANT
PENCIL SMOKE EVACUATOR COATED (MISCELLANEOUS) ×2 IMPLANT
POUCH OSTOMY 2 3/4  H 3804 (WOUND CARE)
POUCH OSTOMY 2 PC DRNBL 2.75 (WOUND CARE) IMPLANT
RELOAD LINEAR CUT PROX 55 BLUE (ENDOMECHANICALS) IMPLANT
RELOAD PROXIMATE 75MM BLUE (ENDOMECHANICALS) ×4 IMPLANT
RELOAD STAPLE 55 3.8 BLU REG (ENDOMECHANICALS) IMPLANT
RELOAD STAPLE 75 3.8 BLU REG (ENDOMECHANICALS) IMPLANT
RETRACTOR WND ALEXIS-O 25 LRG (MISCELLANEOUS) IMPLANT
RETRACTOR WOUND ALXS 18CM MED (MISCELLANEOUS) IMPLANT
RTRCTR WOUND ALEXIS O 18CM MED (MISCELLANEOUS) ×2
RTRCTR WOUND ALEXIS O 25CM LRG (MISCELLANEOUS)
SET BASIN LINEN APH (SET/KITS/TRAYS/PACK) ×2 IMPLANT
SPONGE T-LAP 18X18 ~~LOC~~+RFID (SPONGE) ×2 IMPLANT
STAPLER GUN LINEAR PROX 60 (STAPLE) IMPLANT
STAPLER PROXIMATE 55 BLUE (STAPLE) IMPLANT
STAPLER PROXIMATE 75MM BLUE (STAPLE) IMPLANT
STAPLER VISISTAT (STAPLE) ×2 IMPLANT
SUCTION POOLE HANDLE (INSTRUMENTS) ×2
SUT CHROMIC 0 SH (SUTURE) IMPLANT
SUT CHROMIC 2 0 SH (SUTURE) IMPLANT
SUT CHROMIC 3 0 SH 27 (SUTURE) IMPLANT
SUT PDS AB CT VIOLET #0 27IN (SUTURE) ×4 IMPLANT
SUT PROLENE 2 0 SH 30 (SUTURE) IMPLANT
SUT SILK 3 0 SH CR/8 (SUTURE) ×2 IMPLANT
SUT VIC AB 0 CT1 27 (SUTURE) ×4
SUT VIC AB 0 CT1 27XBRD ANTBC (SUTURE) IMPLANT
SYR 20ML LL LF (SYRINGE) ×4 IMPLANT
TAPE PAPER 2X10 WHT MICROPORE (GAUZE/BANDAGES/DRESSINGS) IMPLANT
TRAY FOLEY MTR SLVR 16FR STAT (SET/KITS/TRAYS/PACK) ×2 IMPLANT

## 2022-06-05 NOTE — Progress Notes (Signed)
Patient transported to room and incision drainage was noted and marked with Caryl Pina, RN present. Drainage was very light red in color.

## 2022-06-05 NOTE — Anesthesia Postprocedure Evaluation (Signed)
Anesthesia Post Note  Patient: Kelly Savage  Procedure(s) Performed: EXPLORATORY LAPAROTOMY WITH OMENECTOMY (Abdomen) SMALL BOWEL RESECTION (Abdomen)  Patient location during evaluation: Phase II Anesthesia Type: General Level of consciousness: awake and alert and oriented Pain management: pain level controlled Vital Signs Assessment: post-procedure vital signs reviewed and stable Respiratory status: spontaneous breathing, nonlabored ventilation and respiratory function stable Cardiovascular status: blood pressure returned to baseline and stable Postop Assessment: no apparent nausea or vomiting Anesthetic complications: no  No notable events documented.   Last Vitals:  Vitals:   06/05/22 1330 06/05/22 1345  BP: 122/60 118/60  Pulse: 81 78  Resp: 20 (!) 21  Temp:    SpO2: 96% 95%    Last Pain:  Vitals:   06/05/22 1300  PainSc: Asleep                 Dalia Jollie C Chasty Randal

## 2022-06-05 NOTE — Telephone Encounter (Signed)
STD Paperwork completed for 6 weeks out of work starting 05/27/2022 and ending 07/13/22. Faxed to [employer] at [phone] and received confirmation.   CPT Code used 49000

## 2022-06-05 NOTE — Telephone Encounter (Signed)
FMLA   Copied Noted sleeved 

## 2022-06-05 NOTE — Anesthesia Procedure Notes (Signed)
Procedure Name: Intubation Date/Time: 06/05/2022 10:32 AM  Performed by: [place] Manus Rudd, CRNAPre-anesthesia Checklist: Patient identified, Emergency Drugs available, Suction available and Patient being monitored Patient Re-evaluated:Patient Re-evaluated prior to induction Oxygen Delivery Method: Circle system utilized Preoxygenation: Pre-oxygenation with 100% oxygen Induction Type: IV induction and Rapid sequence Laryngoscope Size: Mac and 3 Grade View: Grade I Tube type: Oral Tube size: 7.0 mm Number of attempts: 1 Airway Equipment and Method: Stylet and Oral airway Placement Confirmation: ETT inserted through vocal cords under direct vision, positive ETCO2 and breath sounds checked- equal and bilateral Secured at: 21 cm Tube secured with: Tape Dental Injury: Teeth and Oropharynx as per pre-operative assessment

## 2022-06-05 NOTE — Op Note (Signed)
[facility] Operative Note  06/05/22  Preoperative Diagnosis:  Omental abscess/ phlegmon    Postoperative Diagnosis: Omental abscess/ phlegmon, small bowel fistula    Procedure(s) Performed: Exploratory laparotomy, small bowel resection and primary side to side anastomosis, partial omentectomy, washout of abdomen    Surgeon: Lanell Matar. Constance Haw, MD   Assistants: No qualified resident was available    Anesthesia: General endotracheal   Anesthesiologist: Denese Killings, MD    Specimens: Culture of purulence; omentum and small bowel    Estimated Blood Loss: Minimal   Blood Replacement: None    Complications: None   Wound Class: Dirty infected   Operative Indications: [name] is a 67 yo with concern for omental abscess and phlegmon on CT scan that was worsening and developed skin cellulitis while on antibiotics for 1 week. We discussed exploration and possible need to resect the omentum, resect bowel if there was any signs of a fistula, risk of bleeding, infection, wound packing, developing a hernia after all this healed given the infection.   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 General endotracheal anesthesia was induced. Intravenous antibiotics were administered per protocol.  A foley catheter was placed An orogastric tube positioned to decompress the stomach. The abdomen was prepared and draped in the usual sterile fashion.   An upper midline incision was made above the umbilicus and the horizontal scar from her prior incarcerated bowel surgery. With great care I entered the fascia above the suspected area of infection adjacent to the umbilicus to the left, and purulence was noted on entering below the fascia. This was cultured. Upon entering the abdomen (organ space), I encountered an abscess in the anterior abdominal wall and a wall off  phlegmon of the omentum encircling small bowel . I worked to get the omentum off the anterior abdominal wall and there was a loop of bowel also stuck up. I removed this bluntly, and found a bud of mucosa from the small bowel. This was evidently the source of the infection with omentum having been adherent around the area and inflamed.  This was likely a fistula that formed at  some point and was subclinical until recently.  I then proceeded to resect the omentum that I could which was hard and inflamed. This was a short omentum and I could not remove all of it but did get a good sample for pathology. A small serosal tear on the transverse colon was oversewn with 3-0 silk lembert sutures.  The small bowel fistula area was then resected with two linear 75 mm cutting staplers. The mesentery was taken with a ligasure. Two healthy ends of bowel were then placed side to side and a standard anastomosis with another 75 mm linear cutting stapler was performed. The common enterotomy was closed with a TA 60. The staple line was oversewn with 3-0 silk lembert suture and two crotch suture were placed. The lumen was over 2 fingerbreadths.  The mesentery was closed with a 3-0 chromic gut.    The small bowel was ran proximal and distal. The proximal bowel was healthy and non dilated and the prior anastomosis was noted and was patent and was about 20cm upstream from this new anastomosis. The distal small bowel was about 20cm of terminal ileum and then the cecum was noted.  The bowel was placed back into the abdomen in anatomic alignment with no twisting.  The OG was exchanged for an NG and verified with palpation.   The fascia was investigated and was very unhealthy and thickened. There was a defect left of midline with a bridge of fascia between the midline. I closed this small defect with interrupted 0 Vicryls to get it re-approximated. I opted to close the fascia with interrupted 0 PDS in attempt to prevent dehiscence and  hernia.  Exparel was injected. I then placed two skin staples to bring her umbilicus back to midline but left the wound open and packed saline dampened gauze in each small opening. An ABD and paper tape were placed.   Final inspection revealed acceptable hemostasis. All counts were correct at the end of the case. The foley catheter was placed. The patient was awakened from anesthesia and extubated without complication.  The patient went to the PACU in stable condition.   Curlene Labrum, MD [facility] [address] [place], [place] [address] [phone] (office)

## 2022-06-05 NOTE — Progress Notes (Signed)
[facility]  Updated her son about abscess/ fistula and small bowel resection.   Will be inpatient until having bowel function and eating.  PRN for pain IS, OOB NPO, NG  Can have meds if needed SBR awaiting bowel function Pack wound daily [facility] tomorrow Cultures pending, Cipro and Flagyl for 5 days per STOP IT trial SSI insulin  SCDs, heparin sq  Dr. Arnoldo Morale aware of patient and will see tomorrow.   Curlene Labrum, MD [facility] [address] [place], [place] [address] [phone] (office)

## 2022-06-05 NOTE — Interval H&P Note (Signed)
History and Physical Interval Note:  06/05/2022 9:28 AM  Kelly Savage  has presented today for surgery, with the diagnosis of OMENTAL ABSCESS.  The various methods of treatment have been discussed with the patient and family. After consideration of risks, benefits and other options for treatment, the patient has consented to  Procedure(s): EXPLORATORY LAPAROTOMY W POSSIBLE OMENECTOMY AND POSSIBLE BOWEL RESECTION (N/A) as a surgical intervention.  The patient's history has been reviewed, patient examined, no change in status, stable for surgery.  I have reviewed the patient's chart and labs.  Questions were answered to the patient's satisfaction.    Discussed potential need for wound packing given the cellulitis, if I find purulence, she may end up with packing.   Virl Cagey

## 2022-06-05 NOTE — Anesthesia Preprocedure Evaluation (Signed)
Anesthesia Evaluation  Patient identified by MRN, date of birth, ID band Patient awake    Reviewed: Allergy & Precautions, H&P , NPO status , Patient's Chart, lab work & pertinent test results  Airway Mallampati: II  TM Distance: >3 FB Neck ROM: Full    Dental  (+) Dental Advisory Given, Missing, Chipped   Pulmonary neg pulmonary ROS   Pulmonary exam normal breath sounds clear to auscultation       Cardiovascular Exercise Tolerance: Good hypertension, Pt. on medications Normal cardiovascular exam Rhythm:Regular Rate:Normal     Neuro/Psych  Neuromuscular disease (left sided radiculopathy)  negative psych ROS   GI/Hepatic negative GI ROS, Neg liver ROS,,,  Endo/Other  diabetes, Well Controlled, Type 2, Oral Hypoglycemic Agents    Renal/GU negative Renal ROS  negative genitourinary   Musculoskeletal  (+) Arthritis , Osteoarthritis,    Abdominal   Peds negative pediatric ROS (+)  Hematology negative hematology ROS (+)   Anesthesia Other Findings   Reproductive/Obstetrics negative OB ROS                             Anesthesia Physical Anesthesia Plan  ASA: 2  Anesthesia Plan: General   Post-op Pain Management: Dilaudid IV   Induction: Intravenous and Rapid sequence  PONV Risk Score and Plan: 4 or greater and Ondansetron, Dexamethasone and Midazolam  Airway Management Planned: Oral ETT  Additional Equipment:   Intra-op Plan:   Post-operative Plan: Extubation in OR  Informed Consent: I have reviewed the patients History and Physical, chart, labs and discussed the procedure including the risks, benefits and alternatives for the proposed anesthesia with the patient or authorized representative who has indicated his/her understanding and acceptance.     Dental advisory given  Plan Discussed with: CRNA and Surgeon  Anesthesia Plan Comments:        Anesthesia Quick  Evaluation

## 2022-06-05 NOTE — Transfer of Care (Signed)
Immediate Anesthesia Transfer of Care Note  Patient: Kelly Savage  Procedure(s) Performed: EXPLORATORY LAPAROTOMY WITH OMENECTOMY AND SMALL BOWEL RESECTION (Abdomen)  Patient Location: PACU  Anesthesia Type:General  Level of Consciousness: awake, alert , and oriented  Airway & Oxygen Therapy: Patient Spontanous Breathing and Patient connected to face mask oxygen  Post-op Assessment: Report given to RN and Post -op Vital signs reviewed and stable  Post vital signs: Reviewed and stable  Last Vitals:  Vitals Value Taken Time  BP 121/69 06/05/22 1230  Temp    Pulse 76 06/05/22 1231  Resp 20 06/05/22 1231  SpO2 100 % 06/05/22 1231  Vitals shown include unvalidated device data.  Last Pain:  Vitals:   06/05/22 0941  PainSc: 0-No pain         Complications: No notable events documented.

## 2022-06-06 ENCOUNTER — Other Ambulatory Visit: Payer: Self-pay

## 2022-06-06 LAB — BASIC METABOLIC PANEL
Anion gap: 14 (ref 5–15)
BUN: 10 mg/dL (ref 8–23)
CO2: 19 mmol/L — ABNORMAL LOW (ref 22–32)
Calcium: 8 mg/dL — ABNORMAL LOW (ref 8.9–10.3)
Chloride: 104 mmol/L (ref 98–111)
Creatinine, Ser: 0.7 mg/dL (ref 0.44–1.00)
GFR, Estimated: >60 mL/min (ref >60)
Glucose, Bld: 145 mg/dL — ABNORMAL HIGH (ref 70–99)
Potassium: 3.5 mmol/L (ref 3.5–5.1)
Sodium: 137 mmol/L (ref 135–145)

## 2022-06-06 LAB — GLUCOSE, CAPILLARY
Glucose-Capillary: 133 mg/dL — ABNORMAL HIGH (ref 70–99)
Glucose-Capillary: 135 mg/dL — ABNORMAL HIGH (ref 70–99)
Glucose-Capillary: 137 mg/dL — ABNORMAL HIGH (ref 70–99)
Glucose-Capillary: 142 mg/dL — ABNORMAL HIGH (ref 70–99)
Glucose-Capillary: 146 mg/dL — ABNORMAL HIGH (ref 70–99)
Glucose-Capillary: 159 mg/dL — ABNORMAL HIGH (ref 70–99)

## 2022-06-06 LAB — CBC WITH DIFFERENTIAL/PLATELET
Abs Immature Granulocytes: 0.14 10*3/uL — ABNORMAL HIGH (ref 0.00–0.07)
Basophils Absolute: 0 10*3/uL (ref 0.0–0.1)
Basophils Relative: 0 %
Eosinophils Absolute: 0 10*3/uL (ref 0.0–0.5)
Eosinophils Relative: 0 %
HCT: 34.6 % — ABNORMAL LOW (ref 36.0–46.0)
Hemoglobin: 10.9 g/dL — ABNORMAL LOW (ref 12.0–15.0)
Immature Granulocytes: 1 %
Lymphocytes Relative: 3 %
Lymphs Abs: 0.6 10*3/uL — ABNORMAL LOW (ref 0.7–4.0)
MCH: 28.7 pg (ref 26.0–34.0)
MCHC: 31.5 g/dL (ref 30.0–36.0)
MCV: 91.1 fL (ref 80.0–100.0)
Monocytes Absolute: 0.8 10*3/uL (ref 0.1–1.0)
Monocytes Relative: 4 %
Neutro Abs: 17.7 10*3/uL — ABNORMAL HIGH (ref 1.7–7.7)
Neutrophils Relative %: 92 %
Platelets: 420 10*3/uL — ABNORMAL HIGH (ref 150–400)
RBC: 3.8 MIL/uL — ABNORMAL LOW (ref 3.87–5.11)
RDW: 14.6 % (ref 11.5–15.5)
WBC: 19.3 10*3/uL — ABNORMAL HIGH (ref 4.0–10.5)
nRBC: 0 % (ref 0.0–0.2)

## 2022-06-06 LAB — PHOSPHORUS: Phosphorus: 3.5 mg/dL (ref 2.5–4.6)

## 2022-06-06 LAB — MAGNESIUM: Magnesium: 1.7 mg/dL (ref 1.7–2.4)

## 2022-06-06 MED ORDER — ACETAMINOPHEN 325 MG PO TABS
650.0000 mg | ORAL_TABLET | Freq: Four times a day (QID) | ORAL | Status: DC | PRN
  Administered 2022-06-06 – 2022-06-07 (×4): 650 mg via ORAL
  Filled 2022-06-06 (×4): qty 2

## 2022-06-06 MED ORDER — MENTHOL 3 MG MT LOZG
1.0000 | LOZENGE | OROMUCOSAL | Status: DC | PRN
  Filled 2022-06-06: qty 9

## 2022-06-06 MED ORDER — KCL IN DEXTROSE-NACL 40-5-0.45 MEQ/L-%-% IV SOLN: INTRAVENOUS | Status: DC

## 2022-06-06 MED ORDER — PHENOL 1.4 % MT LIQD: 1.0000 | OROMUCOSAL | Status: DC | PRN

## 2022-06-06 NOTE — Progress Notes (Signed)
Assisted patient with sitting on the side of the bed. Patient tolerated well.

## 2022-06-06 NOTE — Progress Notes (Signed)
1 Day Post-Op  Subjective: Patient having mild incisional pain.  Objective: Vital signs in last 24 hours: Temp:  [97.8 F (36.6 C)-99.3 F (37.4 C)] 97.8 F (36.6 C) (02/03 0924) Pulse Rate:  [72-96] 90 (02/03 0924) Resp:  [16-21] 20 (02/03 0924) BP: (112-131)/(55-78) 131/62 (02/03 0924) SpO2:  [91 %-100 %] 91 % (02/03 0924) Last BM Date : 06/04/22  Intake/Output from previous day: 02/02 0701 - 02/03 0700 In: 2932.1 [P.O.:240; I.V.:2392.1; IV Piggyback:300] Out: 1925 [Urine:1675; Emesis/NG output:200; Blood:50] Intake/Output this shift: Total I/O In: 300 [IV Piggyback:300] Out: -   General appearance: alert, cooperative, and no distress Resp: clear to auscultation bilaterally Cardio: regular rate and rhythm, S1, S2 normal, no murmur, click, rub or gallop GI: Soft, incision healing well by secondary intention.  Packing removed.  No bleeding noted.  Lab Results:  Recent Labs    06/06/22 0513  WBC 19.3*  HGB 10.9*  HCT 34.6*  PLT 420*   BMET Recent Labs    06/06/22 0513  NA 137  K 3.5  CL 104  CO2 19*  GLUCOSE 145*  BUN 10  CREATININE 0.70  CALCIUM 8.0*   PT/INR No results for input(s): "LABPROT", "INR" in the last 72 hours.  Studies/Results: DG Chest Port 1 View  Result Date: 06/05/2022 CLINICAL DATA:  Nasogastric tube placement. EXAM: PORTABLE CHEST 1 VIEW COMPARISON:  Chest x-ray dated 09/20/2017. FINDINGS: Enteric tube passes into the stomach. Heart size and mediastinal contours are grossly stable. Lungs are clear. Pleural effusion or pneumothorax is seen. Osseous structures about the chest are unremarkable. IMPRESSION: Enteric tube appears adequately positioned in the stomach. Electronically Signed   By: Franki Cabot M.D.   On: 06/05/2022 13:35    Anti-infectives: Anti-infectives (From admission, onward)    Start     Dose/Rate Route Frequency Ordered Stop   06/05/22 2200  ciprofloxacin (CIPRO) IVPB 400 mg        400 mg 200 mL/hr over 60 Minutes  Intravenous Every 12 hours 06/05/22 1409 06/10/22 2159   06/05/22 2200  metroNIDAZOLE (FLAGYL) IVPB 500 mg        500 mg 100 mL/hr over 60 Minutes Intravenous Every 12 hours 06/05/22 1409 06/10/22 2159   06/05/22 0915  metroNIDAZOLE (FLAGYL) IVPB 500 mg       See Hyperspace for full Linked Orders Report.   500 mg 100 mL/hr over 60 Minutes Intravenous On call to O.R. 06/05/22 1761 06/05/22 1037   06/05/22 0915  ciprofloxacin (CIPRO) IVPB 400 mg       See Hyperspace for full Linked Orders Report.   400 mg 200 mL/hr over 60 Minutes Intravenous On call to O.R. 06/05/22 0903 06/05/22 1048   06/05/22 0904  ciprofloxacin (CIPRO) 400 MG/200ML IVPB       Note to Pharmacy: Donnamarie Rossetti S: cabinet override      06/05/22 0904 06/05/22 1103   06/05/22 0904  metroNIDAZOLE (FLAGYL) 500 MG/100ML IVPB       Note to Pharmacy: Donnamarie Rossetti S: cabinet override      06/05/22 0904 06/05/22 1039       Assessment/Plan: s/p Procedure(s): EXPLORATORY LAPAROTOMY WITH OMENECTOMY SMALL BOWEL RESECTION Impression: Stable on postoperative day 1.  Awaiting return of bowel function.  Will adjust IV fluids.  Starting dressing changes.  Will reassess NG tube tomorrow.  LOS: 1 day    Aviva Signs 06/06/2022

## 2022-06-06 NOTE — Progress Notes (Signed)
Patient slept off and on through this shift. Patient reported this morning that she is having has pain in her abdomen. Patient stated, " it only last for about 10 seconds and then it goes away, it has happen three times." NGT is place and to low intermittent suction. Will continue to monitor.

## 2022-06-07 LAB — GLUCOSE, CAPILLARY
Glucose-Capillary: 120 mg/dL — ABNORMAL HIGH (ref 70–99)
Glucose-Capillary: 141 mg/dL — ABNORMAL HIGH (ref 70–99)
Glucose-Capillary: 156 mg/dL — ABNORMAL HIGH (ref 70–99)
Glucose-Capillary: 164 mg/dL — ABNORMAL HIGH (ref 70–99)
Glucose-Capillary: 170 mg/dL — ABNORMAL HIGH (ref 70–99)
Glucose-Capillary: 180 mg/dL — ABNORMAL HIGH (ref 70–99)

## 2022-06-07 LAB — BASIC METABOLIC PANEL
Anion gap: 12 (ref 5–15)
BUN: 10 mg/dL (ref 8–23)
CO2: 21 mmol/L — ABNORMAL LOW (ref 22–32)
Calcium: 8.3 mg/dL — ABNORMAL LOW (ref 8.9–10.3)
Chloride: 103 mmol/L (ref 98–111)
Creatinine, Ser: 0.63 mg/dL (ref 0.44–1.00)
GFR, Estimated: >60 mL/min (ref >60)
Glucose, Bld: 163 mg/dL — ABNORMAL HIGH (ref 70–99)
Potassium: 3.3 mmol/L — ABNORMAL LOW (ref 3.5–5.1)
Sodium: 136 mmol/L (ref 135–145)

## 2022-06-07 LAB — CBC WITH DIFFERENTIAL/PLATELET
Abs Immature Granulocytes: 0.12 10*3/uL — ABNORMAL HIGH (ref 0.00–0.07)
Basophils Absolute: 0 10*3/uL (ref 0.0–0.1)
Basophils Relative: 0 %
Eosinophils Absolute: 0 10*3/uL (ref 0.0–0.5)
Eosinophils Relative: 0 %
HCT: 32.3 % — ABNORMAL LOW (ref 36.0–46.0)
Hemoglobin: 10.5 g/dL — ABNORMAL LOW (ref 12.0–15.0)
Immature Granulocytes: 1 %
Lymphocytes Relative: 3 %
Lymphs Abs: 0.6 10*3/uL — ABNORMAL LOW (ref 0.7–4.0)
MCH: 28.8 pg (ref 26.0–34.0)
MCHC: 32.5 g/dL (ref 30.0–36.0)
MCV: 88.7 fL (ref 80.0–100.0)
Monocytes Absolute: 0.6 10*3/uL (ref 0.1–1.0)
Monocytes Relative: 4 %
Neutro Abs: 16.9 10*3/uL — ABNORMAL HIGH (ref 1.7–7.7)
Neutrophils Relative %: 92 %
Platelets: 404 10*3/uL — ABNORMAL HIGH (ref 150–400)
RBC: 3.64 MIL/uL — ABNORMAL LOW (ref 3.87–5.11)
RDW: 14.9 % (ref 11.5–15.5)
WBC: 18.3 10*3/uL — ABNORMAL HIGH (ref 4.0–10.5)
nRBC: 0 % (ref 0.0–0.2)

## 2022-06-07 LAB — PHOSPHORUS: Phosphorus: 2.3 mg/dL — ABNORMAL LOW (ref 2.5–4.6)

## 2022-06-07 LAB — MAGNESIUM: Magnesium: 1.8 mg/dL (ref 1.7–2.4)

## 2022-06-07 MED ORDER — POTASSIUM PHOSPHATES 15 MMOLE/5ML IV SOLN
15.0000 mmol | Freq: Once | INTRAVENOUS | Status: AC
  Administered 2022-06-07: 15 mmol via INTRAVENOUS
  Filled 2022-06-07: qty 5

## 2022-06-07 MED ORDER — PANTOPRAZOLE SODIUM 40 MG PO TBEC
40.0000 mg | DELAYED_RELEASE_TABLET | Freq: Every day | ORAL | Status: DC
  Administered 2022-06-07 – 2022-06-09 (×3): 40 mg via ORAL
  Filled 2022-06-07 (×3): qty 1

## 2022-06-07 NOTE — Progress Notes (Signed)
Patient complained of a dull headache. Patient stated, " I have had this same headache since I have been sick and it hasn't gone away". Medications not due for administration at this time. Patient requested to sit up on the side of the bed.  I assisted patient up to the side of the bed and patient tolerated well. Plan of care ongoing.

## 2022-06-07 NOTE — Progress Notes (Signed)
2 Days Post-Op  Subjective: Patient passing flatus.  Objective: Vital signs in last 24 hours: Temp:  [97.6 F (36.4 C)-98.4 F (36.9 C)] 97.6 F (36.4 C) (02/04 0440) Pulse Rate:  [93-94] 94 (02/04 0440) Resp:  [16-24] 16 (02/04 0440) BP: (124-135)/(60-71) 135/71 (02/04 0440) SpO2:  [91 %-92 %] 92 % (02/04 0440) Last BM Date : 06/04/22  Intake/Output from previous day: 02/03 0701 - 02/04 0700 In: 610.4 [I.V.:10.4; IV Piggyback:600] Out: 1300 [Urine:900; Emesis/NG output:400] Intake/Output this shift: No intake/output data recorded.  General appearance: alert, cooperative, and no distress Resp: clear to auscultation bilaterally Cardio: regular rate and rhythm, S1, S2 normal, no murmur, click, rub or gallop GI: Soft, active bowel sounds appreciated.  Dressing intact.  Lab Results:  Recent Labs    06/06/22 0513 06/07/22 0427  WBC 19.3* 18.3*  HGB 10.9* 10.5*  HCT 34.6* 32.3*  PLT 420* 404*   BMET Recent Labs    06/06/22 0513 06/07/22 0427  NA 137 136  K 3.5 3.3*  CL 104 103  CO2 19* 21*  GLUCOSE 145* 163*  BUN 10 10  CREATININE 0.70 0.63  CALCIUM 8.0* 8.3*   PT/INR No results for input(s): "LABPROT", "INR" in the last 72 hours.  Studies/Results: DG Chest Port 1 View  Result Date: 06/05/2022 CLINICAL DATA:  Nasogastric tube placement. EXAM: PORTABLE CHEST 1 VIEW COMPARISON:  Chest x-ray dated 09/20/2017. FINDINGS: Enteric tube passes into the stomach. Heart size and mediastinal contours are grossly stable. Lungs are clear. Pleural effusion or pneumothorax is seen. Osseous structures about the chest are unremarkable. IMPRESSION: Enteric tube appears adequately positioned in the stomach. Electronically Signed   By: Franki Cabot M.D.   On: 06/05/2022 13:35    Anti-infectives: Anti-infectives (From admission, onward)    Start     Dose/Rate Route Frequency Ordered Stop   06/05/22 2200  ciprofloxacin (CIPRO) IVPB 400 mg        400 mg 200 mL/hr over 60 Minutes  Intravenous Every 12 hours 06/05/22 1409 06/10/22 2159   06/05/22 2200  metroNIDAZOLE (FLAGYL) IVPB 500 mg        500 mg 100 mL/hr over 60 Minutes Intravenous Every 12 hours 06/05/22 1409 06/10/22 2159   06/05/22 0915  metroNIDAZOLE (FLAGYL) IVPB 500 mg       See Hyperspace for full Linked Orders Report.   500 mg 100 mL/hr over 60 Minutes Intravenous On call to O.R. 06/05/22 1607 06/05/22 1037   06/05/22 0915  ciprofloxacin (CIPRO) IVPB 400 mg       See Hyperspace for full Linked Orders Report.   400 mg 200 mL/hr over 60 Minutes Intravenous On call to O.R. 06/05/22 0903 06/05/22 1048   06/05/22 0904  ciprofloxacin (CIPRO) 400 MG/200ML IVPB       Note to Pharmacy: Donnamarie Rossetti S: cabinet override      06/05/22 0904 06/05/22 1103   06/05/22 0904  metroNIDAZOLE (FLAGYL) 500 MG/100ML IVPB       Note to Pharmacy: Donnamarie Rossetti S: cabinet override      06/05/22 0904 06/05/22 1039       Assessment/Plan: s/p Procedure(s): EXPLORATORY LAPAROTOMY WITH OMENECTOMY SMALL BOWEL RESECTION Impression: Postoperative day 2.  Patient's bowel function is returning.  Will remove NG tube and start clear liquid diet.  Hypophosphatemia noted and will be addressed.  Mild hypokalemia also noted.  Continue maintenance IV fluid.  LOS: 2 days    Aviva Signs 06/07/2022

## 2022-06-07 NOTE — Progress Notes (Signed)
Pharmacy Electrolyte Replacement  Recent Labs:  Recent Labs    06/07/22 0427  K 3.3*  MG 1.8  PHOS 2.3*  CREATININE 0.63    Low Critical Values (K </= 2.5, Phos </= 1, Mg </= 1) Present:   A/Plan: 67 yo s/p small bowel rescection with noted hypophosphatemia and mild hypokalemia. Pharmacy asked to replace phosphate. Kphos 46mmol IV x 1 F/U Phosphate level in AM  Isac Sarna, BS Pharm D, BCPS Clinical Pharmacist

## 2022-06-07 NOTE — Progress Notes (Signed)
Entered patient room on rounding. Patient stated, "I think this IV has gone bad". Observed patient right forearm with a linear bruising, no swelling, and patient complained of no pain. IV removed from the right antecubital space, catheter intact. Patient tolerated well. New IV restarted by charge RN. Care plan ongoing.

## 2022-06-07 NOTE — Progress Notes (Signed)
Patient reports that she has passed gas 3 times.

## 2022-06-08 LAB — GLUCOSE, CAPILLARY
Glucose-Capillary: 122 mg/dL — ABNORMAL HIGH (ref 70–99)
Glucose-Capillary: 143 mg/dL — ABNORMAL HIGH (ref 70–99)
Glucose-Capillary: 146 mg/dL — ABNORMAL HIGH (ref 70–99)
Glucose-Capillary: 177 mg/dL — ABNORMAL HIGH (ref 70–99)
Glucose-Capillary: 184 mg/dL — ABNORMAL HIGH (ref 70–99)
Glucose-Capillary: 191 mg/dL — ABNORMAL HIGH (ref 70–99)
Glucose-Capillary: 235 mg/dL — ABNORMAL HIGH (ref 70–99)

## 2022-06-08 LAB — CBC WITH DIFFERENTIAL/PLATELET
Abs Immature Granulocytes: 0.12 10*3/uL — ABNORMAL HIGH (ref 0.00–0.07)
Basophils Absolute: 0.1 10*3/uL (ref 0.0–0.1)
Basophils Relative: 1 %
Eosinophils Absolute: 0.6 10*3/uL — ABNORMAL HIGH (ref 0.0–0.5)
Eosinophils Relative: 5 %
HCT: 30.3 % — ABNORMAL LOW (ref 36.0–46.0)
Hemoglobin: 9.4 g/dL — ABNORMAL LOW (ref 12.0–15.0)
Immature Granulocytes: 1 %
Lymphocytes Relative: 10 %
Lymphs Abs: 1.2 10*3/uL (ref 0.7–4.0)
MCH: 27.9 pg (ref 26.0–34.0)
MCHC: 31 g/dL (ref 30.0–36.0)
MCV: 89.9 fL (ref 80.0–100.0)
Monocytes Absolute: 0.6 10*3/uL (ref 0.1–1.0)
Monocytes Relative: 5 %
Neutro Abs: 8.8 10*3/uL — ABNORMAL HIGH (ref 1.7–7.7)
Neutrophils Relative %: 78 %
Platelets: 330 10*3/uL (ref 150–400)
RBC: 3.37 MIL/uL — ABNORMAL LOW (ref 3.87–5.11)
RDW: 14.9 % (ref 11.5–15.5)
WBC: 11.3 10*3/uL — ABNORMAL HIGH (ref 4.0–10.5)
nRBC: 0 % (ref 0.0–0.2)

## 2022-06-08 LAB — BASIC METABOLIC PANEL
Anion gap: 9 (ref 5–15)
BUN: 8 mg/dL (ref 8–23)
CO2: 25 mmol/L (ref 22–32)
Calcium: 7.8 mg/dL — ABNORMAL LOW (ref 8.9–10.3)
Chloride: 102 mmol/L (ref 98–111)
Creatinine, Ser: 0.56 mg/dL (ref 0.44–1.00)
GFR, Estimated: >60 mL/min (ref >60)
Glucose, Bld: 149 mg/dL — ABNORMAL HIGH (ref 70–99)
Potassium: 3.7 mmol/L (ref 3.5–5.1)
Sodium: 136 mmol/L (ref 135–145)

## 2022-06-08 LAB — MAGNESIUM: Magnesium: 1.8 mg/dL (ref 1.7–2.4)

## 2022-06-08 LAB — SURGICAL PATHOLOGY

## 2022-06-08 LAB — PHOSPHORUS: Phosphorus: 1.6 mg/dL — ABNORMAL LOW (ref 2.5–4.6)

## 2022-06-08 MED ORDER — OXYCODONE HCL 5 MG PO TABS
5.0000 mg | ORAL_TABLET | ORAL | Status: DC | PRN
  Administered 2022-06-08 (×3): 10 mg via ORAL
  Administered 2022-06-09: 5 mg via ORAL
  Filled 2022-06-08 (×2): qty 2
  Filled 2022-06-08: qty 1
  Filled 2022-06-08: qty 2

## 2022-06-08 MED ORDER — DOCUSATE SODIUM 100 MG PO CAPS
100.0000 mg | ORAL_CAPSULE | Freq: Two times a day (BID) | ORAL | Status: DC
  Administered 2022-06-08 – 2022-06-09 (×3): 100 mg via ORAL
  Filled 2022-06-08 (×3): qty 1

## 2022-06-08 MED ORDER — MORPHINE SULFATE (PF) 2 MG/ML IV SOLN: 2.0000 mg | INTRAVENOUS | Status: DC | PRN

## 2022-06-08 MED ORDER — POTASSIUM PHOSPHATES 15 MMOLE/5ML IV SOLN
30.0000 mmol | Freq: Once | INTRAVENOUS | Status: AC
  Administered 2022-06-08: 30 mmol via INTRAVENOUS
  Filled 2022-06-08: qty 10

## 2022-06-08 NOTE — Progress Notes (Signed)
  Transition of Care [facility]) Screening Note   Patient Details  Name: Kelly Savage Date of Birth: 1955/12/25   Transition of Care [facility]) CM/SW Contact:    Iona Beard, [place] Phone Number: 06/08/2022, 11:30 AM    Transition of Care Department [facility]) has reviewed patient and no TOC needs have been identified at this time. We will continue to monitor patient advancement through interdisciplinary progression rounds. If new patient transition needs arise, please place a TOC consult.

## 2022-06-08 NOTE — Progress Notes (Signed)
Pharmacy Electrolyte Replacement  Recent Labs:  Recent Labs    06/08/22 0446  K 3.7  MG 1.8  PHOS 1.6*  CREATININE 0.56     Low Critical Values (K </= 2.5, Phos </= 1, Mg </= 1) Present:    A/Plan: 67 yo s/p small bowel rescection with noted hypophosphatemia and mild hypokalemia. Pharmacy asked to replace phosphate. Phosphate level 2.3> 1.6, eventhough IV phosphate given K 3.3> 3.7. Will give KPhos 41mmol IV x 1 today F/U Phosphate level in AM  Isac Sarna, BS Pharm D, BCPS Clinical Pharmacist

## 2022-06-09 ENCOUNTER — Inpatient Hospital Stay: Payer: No Typology Code available for payment source | Admitting: Family Medicine

## 2022-06-09 LAB — CBC WITH DIFFERENTIAL/PLATELET
Abs Immature Granulocytes: 0.09 10*3/uL — ABNORMAL HIGH (ref 0.00–0.07)
Basophils Absolute: 0.1 10*3/uL (ref 0.0–0.1)
Basophils Relative: 1 %
Eosinophils Absolute: 0.5 10*3/uL (ref 0.0–0.5)
Eosinophils Relative: 5 %
HCT: 32.1 % — ABNORMAL LOW (ref 36.0–46.0)
Hemoglobin: 10.1 g/dL — ABNORMAL LOW (ref 12.0–15.0)
Immature Granulocytes: 1 %
Lymphocytes Relative: 11 %
Lymphs Abs: 1 10*3/uL (ref 0.7–4.0)
MCH: 28.3 pg (ref 26.0–34.0)
MCHC: 31.5 g/dL (ref 30.0–36.0)
MCV: 89.9 fL (ref 80.0–100.0)
Monocytes Absolute: 0.6 10*3/uL (ref 0.1–1.0)
Monocytes Relative: 7 %
Neutro Abs: 7.1 10*3/uL (ref 1.7–7.7)
Neutrophils Relative %: 75 %
Platelets: 379 10*3/uL (ref 150–400)
RBC: 3.57 MIL/uL — ABNORMAL LOW (ref 3.87–5.11)
RDW: 15.1 % (ref 11.5–15.5)
WBC: 9.4 10*3/uL (ref 4.0–10.5)
nRBC: 0 % (ref 0.0–0.2)

## 2022-06-09 LAB — BASIC METABOLIC PANEL
Anion gap: 8 (ref 5–15)
BUN: 6 mg/dL — ABNORMAL LOW (ref 8–23)
CO2: 28 mmol/L (ref 22–32)
Calcium: 7.7 mg/dL — ABNORMAL LOW (ref 8.9–10.3)
Chloride: 99 mmol/L (ref 98–111)
Creatinine, Ser: 0.53 mg/dL (ref 0.44–1.00)
GFR, Estimated: >60 mL/min (ref >60)
Glucose, Bld: 174 mg/dL — ABNORMAL HIGH (ref 70–99)
Potassium: 3.5 mmol/L (ref 3.5–5.1)
Sodium: 135 mmol/L (ref 135–145)

## 2022-06-09 LAB — MAGNESIUM: Magnesium: 1.6 mg/dL — ABNORMAL LOW (ref 1.7–2.4)

## 2022-06-09 LAB — GLUCOSE, CAPILLARY
Glucose-Capillary: 167 mg/dL — ABNORMAL HIGH (ref 70–99)
Glucose-Capillary: 158 mg/dL — ABNORMAL HIGH (ref 70–99)
Glucose-Capillary: 214 mg/dL — ABNORMAL HIGH (ref 70–99)
Glucose-Capillary: 217 mg/dL — ABNORMAL HIGH (ref 70–99)

## 2022-06-09 LAB — PHOSPHORUS: Phosphorus: 2.4 mg/dL — ABNORMAL LOW (ref 2.5–4.6)

## 2022-06-09 MED ORDER — ONDANSETRON 4 MG PO TBDP: 4.0000 mg | ORAL_TABLET | Freq: Four times a day (QID) | ORAL | 0 refills | Status: DC | PRN

## 2022-06-09 MED ORDER — CIPROFLOXACIN HCL 500 MG PO TABS: 500.0000 mg | ORAL_TABLET | Freq: Two times a day (BID) | ORAL | 0 refills | Status: AC

## 2022-06-09 MED ORDER — METRONIDAZOLE 500 MG PO TABS: 500.0000 mg | ORAL_TABLET | Freq: Three times a day (TID) | ORAL | 0 refills | Status: AC

## 2022-06-09 MED ORDER — POTASSIUM PHOSPHATES 15 MMOLE/5ML IV SOLN
15.0000 mmol | Freq: Once | INTRAVENOUS | Status: AC
  Administered 2022-06-09: 15 mmol via INTRAVENOUS
  Filled 2022-06-09: qty 5

## 2022-06-09 MED ORDER — MAGNESIUM SULFATE 2 GM/50ML IV SOLN
2.0000 g | Freq: Once | INTRAVENOUS | Status: AC
  Administered 2022-06-09: 2 g via INTRAVENOUS
  Filled 2022-06-09: qty 50

## 2022-06-09 MED ORDER — OXYCODONE HCL 5 MG PO TABS: 5.0000 mg | ORAL_TABLET | ORAL | 0 refills | Status: DC | PRN

## 2022-06-09 NOTE — Progress Notes (Signed)
Pharmacy Electrolyte Replacement  Recent Labs:  Recent Labs    06/09/22 0427  K 3.5  MG 1.6*  PHOS 2.4*  CREATININE 0.53     A/Plan: 67 yo s/p small bowel rescection with noted hypophosphatemia. Pharmacy asked to replace phosphate.  Phosphate level 2.3> 1.6>2.4, potassium 3.5, Mg 1.6  Will give KPhos 33mmol IV x 1 today Mag Sulfate 2g IV x 1 F/U magnesium, phosphate level in AM  Erin Hearing PharmD., BCPS Clinical Pharmacist 06/09/2022 8:18 AM

## 2022-06-09 NOTE — Discharge Summary (Signed)
Physician Discharge Summary  Patient ID: Kelly Savage MRN: [id number] DOB/AGE: 06-21-1955 67 y.o.  Admit date: 06/05/2022 Discharge date: 06/09/2022  Admission Diagnoses: Omental abscess/ phlegmon  Discharge Diagnoses:  Principal Problem:   Omental abscess ([place]) Active Problems:   Intra-abdominal infection   Small bowel fistula   Discharged Condition: good  Hospital Course: Kelly Savage is a 67 yo who had an omental abscess and phlegmon related to a small bowel perforation of unknown etiology.  She was taken back for exploration and this was found. She had a partial omentectomy and small bowel resection. Cultures of the fluid have not grown any bacteria but she has been on augmentin leading up to this surgery. Due to her cellulitis and pain, she definitely had an infection and was changed to cipro and flagyl post operatively. Prior to discharge she was eating, having a lot of flatus and felt like she would have a BM if she went home. She wanted to go home and understood her return precautions. She was taught to change her wound prior to discharge and sent home with supplies.   Consults: None  Significant Diagnostic Studies: CT with omental phlegmon/ abscess   Treatments: IV hydration, antibiotics: Cipro and metronidazole, and Ex lap, omental excision and small bowel resection with side to side anastomosis   Discharge Exam: Blood pressure (!) 114/47, pulse 79, temperature 98.4 F (36.9 C), temperature source Oral, resp. rate 18, height 5\' 8"  (1.727 m), weight 92 kg, SpO2 92 %. General appearance: alert and no distress Resp: normal work of breathing GI: soft, nondistended, dressing in place, RN to educate patient on wound packing today  Disposition: Discharge disposition: 01-Home or Self Care       Discharge Instructions     Call MD for:  difficulty breathing, headache or visual disturbances   Complete by: As directed    Call MD for:  extreme fatigue   Complete by: As directed     Call MD for:  persistant dizziness or light-headedness   Complete by: As directed    Call MD for:  persistant nausea and vomiting   Complete by: As directed    Call MD for:  redness, tenderness, or signs of infection (pain, swelling, redness, odor or green/yellow discharge around incision site)   Complete by: As directed    Call MD for:  severe uncontrolled pain   Complete by: As directed    Call MD for:  temperature >100.4   Complete by: As directed    Increase activity slowly   Complete by: As directed       Allergies as of 06/09/2022   No Known Allergies      Medication List     TAKE these medications    Aspirin Low Dose 81 MG tablet Generic drug: aspirin EC Take 1 tablet (81 mg total) by mouth daily.   benazepril 5 MG tablet Commonly known as: LOTENSIN Take 1 tablet (5 mg total) by mouth daily.   blood glucose meter kit and supplies Dispense based on patient and insurance preference. Three times daily testing dx e11.65   calcium carbonate 1500 (600 Ca) MG Tabs tablet Commonly known as: OSCAL Take 1,200 mg by mouth every evening.   ciprofloxacin 500 MG tablet Commonly known as: Cipro Take 1 tablet (500 mg total) by mouth 2 (two) times daily for 5 days.   diclofenac 75 MG EC tablet Commonly known as: VOLTAREN Take 1 tablet (75 mg total) by mouth daily as needed for arthritic  pain   freestyle lancets Use as instructed to test blood sugar once daily.   FREESTYLE LITE test strip Generic drug: glucose blood Use as instructed to test blood sugar once daily.   glipiZIDE 10 MG 24 hr tablet Commonly known as: GLUCOTROL XL Take 2 tablets (20 mg total) by mouth in the morning at breakfast   Janumet XR 50-1000 MG Tb24 Generic drug: SitaGLIPtin-MetFORMIN HCl Take 1 tablet by mouth 2 (two) times daily.   Jardiance 10 MG Tabs tablet Generic drug: empagliflozin Take 1 tablet (10 mg total) by mouth daily before breakfast.   metroNIDAZOLE 500 MG tablet Commonly  known as: Flagyl Take 1 tablet (500 mg total) by mouth 3 (three) times daily for 5 days.   ondansetron 4 MG disintegrating tablet Commonly known as: ZOFRAN-ODT Take 1 tablet (4 mg total) by mouth every 6 (six) hours as needed for nausea.   oxyCODONE 5 MG immediate release tablet Commonly known as: Oxy IR/ROXICODONE Take 1 tablet (5 mg total) by mouth every 4 (four) hours as needed for severe pain or breakthrough pain.   rosuvastatin 20 MG tablet Commonly known as: Crestor Take 1 tablet (20 mg total) by mouth daily.        Follow-up Information     Virl Cagey, MD Follow up on 06/17/2022.   Specialty: General Surgery Why: wound check Contact information: [address] [place] [facility] [address] [phone]                Future Appointments  Date Time Provider [place]  06/17/2022  3:15 PM Virl Cagey, MD RS-RS None  07/14/2022  9:40 AM GI-BCG MM 3 GI-BCGMM GI-BREAST CE  08/03/2022  9:15 AM Carlan, Deatra Robinson, NP NRE-NRE None    Signed: Virl Cagey 06/09/2022, 1:01 PM

## 2022-06-09 NOTE — Discharge Instructions (Signed)
Discharge Open Abdominal Surgery Instructions:  Common Complaints: Pain at the incision site is common. This will improve with time. Take your pain medications as described below. Some nausea is common and poor appetite. The main goal is to stay hydrated the first few days after surgery.   Diet/ Activity: Diet as tolerated.  You have started and tolerated a diet in the hospital, and should continue to increase what you are able to eat.   You may not have a large appetite, but it is important to stay hydrated. Drink 64 ounces of water a day. Your appetite will return with time.  Replace dressing daily and pack with saline dampened gauze, cover with pad (feminine pads are cheaper and same thing as pads we use) and paper tape.  Shower per your regular routine daily. Do not submerge. Replace your dressing after showering.  Wear an abdominal binder daily with activity. You do not have to wear this while sleeping or sitting.  Rest and listen to your body, but do not remain in bed all day.  Walk everyday for at least 15-20 minutes. Deep cough and move around every 1-2 hours in the first few days after surgery.  Do not lift > 10 lbs, perform excessive bending, pushing, pulling, squatting for 6-8 weeks after surgery.  The activity restrictions and the abdominal binder are to prevent hernia formation at your incision while you are healing.  Do not place lotions or balms on your incision unless instructed to specifically by Dr. Constance Haw.   Pain Expectations and Narcotics: -After surgery you will have pain associated with your incisions and this is normal. The pain is muscular and nerve pain, and will get better with time. -You are encouraged and expected to take non narcotic medications like tylenol and ibuprofen (when able) to treat pain as multiple modalities can aid with pain treatment. -Narcotics are only used when pain is severe or there is breakthrough pain. -You are not expected to have a pain  score of 0 after surgery, as we cannot prevent pain. A pain score of 3-4 that allows you to be functional, move, walk, and tolerate some activity is the goal. The pain will continue to improve over the days after surgery and is dependent on your surgery. -Due to [place] law, we are only able to give a certain amount of pain medication to treat post operative pain, and we only give additional narcotics on a patient by patient basis.  -For most laparoscopic surgery, studies have shown that the majority of patients only need 10-15 narcotic pills, and for open surgeries most patients only need 15-20.   -Having appropriate expectations of pain and knowledge of pain management with non narcotics is important as we do not want anyone to become addicted to narcotic pain medication.  -Using ice packs in the first 48 hours and heating pads after 48 hours, wearing an abdominal binder (when recommended), and using over the counter medications are all ways to help with pain management.   -Simple acts like meditation and mindfulness practices after surgery can also help with pain control and research has proven the benefit of these practices.  Medication: Take tylenol and ibuprofen as needed for pain control, alternating every 4-6 hours.  Example:  Tylenol 1000mg  @ 6am, 12noon, 6pm, 40midnight (Do not exceed 4000mg  of tylenol a day). Ibuprofen 800mg  @ 9am, 3pm, 9pm, 3am (Do not exceed 3600mg  of ibuprofen a day).  Take Roxicodone for breakthrough pain every 4 hours.  Take Colace for  constipation related to narcotic pain medication. If you do not have a bowel movement in 2 days, take Miralax over the counter.  Drink plenty of water to also prevent constipation.   Contact Information: If you have questions or concerns, please call our office, [phone], Monday- Thursday 8AM-5PM and Friday 8AM-12Noon.  If it is after hours or on the weekend, please call [facility], [phone], [phone], and ask to speak  to the surgeon on call for Dr. Constance Haw at [facility].

## 2022-06-10 ENCOUNTER — Telehealth: Payer: Self-pay

## 2022-06-10 NOTE — Telephone Encounter (Signed)
Transition Care Management Unsuccessful Follow-up Telephone Call  Date of discharge and from where:  [facility] 06/09/2022 dx: omental abscess  Attempts:  1st Attempt  Reason for unsuccessful TCM follow-up call:  Left voice message [name], [place] Direct Dial [phone]

## 2022-06-11 ENCOUNTER — Encounter (HOSPITAL_COMMUNITY): Payer: Self-pay | Admitting: General Surgery

## 2022-06-11 ENCOUNTER — Other Ambulatory Visit: Payer: Self-pay | Admitting: *Deleted

## 2022-06-11 MED ORDER — OXYCODONE HCL 5 MG PO TABS: 5.0000 mg | ORAL_TABLET | ORAL | 0 refills | Status: DC | PRN

## 2022-06-11 NOTE — Telephone Encounter (Signed)
Transition Care Management Follow-up Telephone Call Date of discharge and from where: [name] 06/09/2022 How have you been since you were released from the hospital? better Any questions or concerns? No  Items Reviewed: Did the pt receive and understand the discharge instructions provided? Yes  Medications obtained and verified? Yes  Other? No  Any new allergies since your discharge? No  Dietary orders reviewed? Yes Do you have support at home? Yes   Home Care and Equipment/Supplies: Were home health services ordered? no If so, what is the name of the agency? N/a  Has the agency set up a time to come to the patient's home? no Were any new equipment or medical supplies ordered?  No What is the name of the medical supply agency? N/a Were you able to get the supplies/equipment? no Do you have any questions related to the use of the equipment or supplies? No  Functional Questionnaire: (I = Independent and D = Dependent) ADLs: I  Bathing/Dressing- I  Meal Prep- I  Eating- I  Maintaining continence- I  Transferring/Ambulation- I  Managing Meds- I  Follow up appointments reviewed:  PCP Hospital f/u appt confirmed? No   Specialist Hospital f/u appt confirmed? Yes  Scheduled to see Dr Constance Haw on 06/17/2022 @ 10:00. Are transportation arrangements needed? No  If their condition worsens, is the pt aware to call PCP or go to the Emergency Dept.? Yes Was the patient provided with contact information for the PCP's office or ED? Yes Was to pt encouraged to call back with questions or concerns? Yes  [name], LPN [place] Direct Dial [phone]

## 2022-06-11 NOTE — Telephone Encounter (Signed)
Surgical Date: 06/04/2022 Procedure: Ex Lap, Omentectomy   Received call from patient [phone]~ telephone.  Requested refill on pain medication: Oxycodone 5 mg PO Q 4 hrs PRN Pharmacy: [facility]  Last refill 06/09/2022, #10 tabs  Patient states that she does not have enough medication to last throughout the weekend.   Patient reports that she is having more pain today than she had previously. States that she feels she is moving around more than she was after the surgery, so more discomfort is to be expected.   Patient reports that incision is healing well. Denies redness to surrounding skin, intense pain, fever, chills, etc. States that she has been up and walking a lot since she returned home.   Advised patient to also alternate APAP/ IBU scheduled to assist in pain management. Advised to continue walking 10- 15 minutes at a time.   Ok to refill pain medication?

## 2022-06-13 LAB — AEROBIC/ANAEROBIC CULTURE W GRAM STAIN (SURGICAL/DEEP WOUND): Culture: NO GROWTH

## 2022-06-15 ENCOUNTER — Telehealth: Payer: Self-pay | Admitting: Family Medicine

## 2022-06-15 NOTE — Telephone Encounter (Signed)
FMLA paperwork filled out and faxed to [employer] at [phone]. Confirmation received.  Patient out of work starting 05/27/2022 and may return on 07/13/2022.  Dx: Omental abscess that required exploratory laparotomy, small bowel resection and primary side to side anastomosis, partial omentectomy, washout of abdomen.

## 2022-06-17 ENCOUNTER — Ambulatory Visit (INDEPENDENT_AMBULATORY_CARE_PROVIDER_SITE_OTHER): Payer: 59 | Admitting: General Surgery

## 2022-06-17 ENCOUNTER — Encounter: Payer: Self-pay | Admitting: General Surgery

## 2022-06-17 VITALS — BP 115/73 | HR 72 | Temp 97.6°F | Resp 14 | Ht 68.0 in | Wt 192.0 lb

## 2022-06-17 DIAGNOSIS — K651 Peritoneal abscess: Secondary | ICD-10-CM

## 2022-06-17 NOTE — Patient Instructions (Signed)
No heavy lifting > 10 lbs, excessive bending, pushing, pulling, or squatting for 6-8 weeks after surgery.  Continue to pack the wound. Call with changes.

## 2022-06-17 NOTE — Progress Notes (Unsigned)
[facility]  Future Appointments  Date Time Provider [place]  07/02/2022  3:45 PM Virl Cagey, MD RS-RS None  07/14/2022  9:40 AM GI-BCG MM 3 GI-BCGMM GI-BREAST CE  08/03/2022  9:15 AM Carlan, Deatra Robinson, NP NRE-NRE None

## 2022-06-24 DIAGNOSIS — E7800 Pure hypercholesterolemia, unspecified: Secondary | ICD-10-CM | POA: Diagnosis not present

## 2022-06-24 DIAGNOSIS — E669 Obesity, unspecified: Secondary | ICD-10-CM | POA: Diagnosis not present

## 2022-06-24 DIAGNOSIS — E1165 Type 2 diabetes mellitus with hyperglycemia: Secondary | ICD-10-CM | POA: Diagnosis not present

## 2022-06-24 DIAGNOSIS — I10 Essential (primary) hypertension: Secondary | ICD-10-CM | POA: Diagnosis not present

## 2022-06-25 ENCOUNTER — Other Ambulatory Visit: Payer: Self-pay | Admitting: Family Medicine

## 2022-06-25 ENCOUNTER — Other Ambulatory Visit (HOSPITAL_COMMUNITY): Payer: Self-pay

## 2022-06-25 MED ORDER — DICLOFENAC SODIUM 75 MG PO TBEC
75.0000 mg | DELAYED_RELEASE_TABLET | Freq: Every day | ORAL | 0 refills | Status: DC | PRN
  Filled 2022-06-25: qty 90, 90d supply, fill #0

## 2022-07-02 ENCOUNTER — Encounter: Payer: Self-pay | Admitting: Radiology

## 2022-07-02 ENCOUNTER — Encounter: Payer: Self-pay | Admitting: General Surgery

## 2022-07-02 ENCOUNTER — Ambulatory Visit (INDEPENDENT_AMBULATORY_CARE_PROVIDER_SITE_OTHER): Payer: 59 | Admitting: General Surgery

## 2022-07-02 VITALS — BP 113/73 | HR 65 | Temp 97.6°F | Resp 12 | Ht 68.0 in | Wt 203.0 lb

## 2022-07-02 DIAGNOSIS — K651 Peritoneal abscess: Secondary | ICD-10-CM

## 2022-07-02 NOTE — Patient Instructions (Signed)
No heavy lifting > 10 lbs, excessive bending, pushing, pulling, or squatting for 6-8 weeks after surgery.  Ok to shower. Ok to return to work 07/13/22.

## 2022-07-03 NOTE — Progress Notes (Signed)
[facility]  Doing well and no major issues. Still weak and getting stronger. She is eating and having regular Bms.  Midline wound almost healed completely.  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   No heavy lifting > 10 lbs, excessive bending, pushing, pulling, or squatting for 6-8 weeks after surgery.  Ok to shower. Ok to return to work 07/13/22 Continue midline wound dressing until skin over the area  Curlene Labrum, MD [facility] [address] [place], [place] [address] [phone] (office)

## 2022-07-08 ENCOUNTER — Encounter: Payer: 59 | Admitting: General Surgery

## 2022-07-10 DIAGNOSIS — Z6829 Body mass index (BMI) 29.0-29.9, adult: Secondary | ICD-10-CM | POA: Diagnosis not present

## 2022-07-10 DIAGNOSIS — M419 Scoliosis, unspecified: Secondary | ICD-10-CM | POA: Diagnosis not present

## 2022-07-14 ENCOUNTER — Ambulatory Visit
Admission: RE | Admit: 2022-07-14 | Discharge: 2022-07-14 | Disposition: A | Discharge status: Home / Self Care | Payer: 59 | Source: Ambulatory Visit | Attending: Family Medicine | Admitting: Family Medicine

## 2022-07-14 DIAGNOSIS — Z1231 Encounter for screening mammogram for malignant neoplasm of breast: Secondary | ICD-10-CM | POA: Diagnosis not present

## 2022-07-26 ENCOUNTER — Other Ambulatory Visit: Payer: Self-pay | Admitting: Family Medicine

## 2022-07-26 ENCOUNTER — Other Ambulatory Visit (HOSPITAL_COMMUNITY): Payer: Self-pay

## 2022-07-27 ENCOUNTER — Other Ambulatory Visit (HOSPITAL_COMMUNITY): Payer: Self-pay

## 2022-07-27 MED ORDER — JANUMET XR 50-1000 MG PO TB24
1.0000 | ORAL_TABLET | Freq: Two times a day (BID) | ORAL | 11 refills | Status: DC
  Filled 2022-07-27: qty 60, 30d supply, fill #0
  Filled 2022-08-28: qty 60, 30d supply, fill #1
  Filled 2022-09-29: qty 60, 30d supply, fill #2
  Filled 2022-11-06: qty 60, 30d supply, fill #3
  Filled 2022-12-07: qty 60, 30d supply, fill #4
  Filled 2023-01-04: qty 60, 30d supply, fill #5
  Filled 2023-02-04: qty 60, 30d supply, fill #6
  Filled 2023-03-03: qty 60, 30d supply, fill #7
  Filled 2023-04-07: qty 60, 30d supply, fill #8
  Filled 2023-05-04: qty 60, 30d supply, fill #9
  Filled 2023-06-07: qty 60, 30d supply, fill #10
  Filled 2023-07-05: qty 60, 30d supply, fill #11

## 2022-07-27 MED ORDER — GLIPIZIDE ER 10 MG PO TB24
20.0000 mg | ORAL_TABLET | Freq: Every morning | ORAL | 1 refills | Status: DC
  Filled 2022-07-27: qty 180, 90d supply, fill #0
  Filled 2022-10-21: qty 180, 90d supply, fill #1

## 2022-07-27 MED ORDER — ROSUVASTATIN CALCIUM 20 MG PO TABS
20.0000 mg | ORAL_TABLET | Freq: Every day | ORAL | 3 refills | Status: DC
  Filled 2022-07-27: qty 90, 90d supply, fill #0
  Filled 2022-10-21: qty 90, 90d supply, fill #1
  Filled 2023-01-26: qty 90, 90d supply, fill #2
  Filled 2023-04-07: qty 90, 90d supply, fill #3

## 2022-08-03 ENCOUNTER — Ambulatory Visit (INDEPENDENT_AMBULATORY_CARE_PROVIDER_SITE_OTHER): Payer: 59 | Admitting: Gastroenterology

## 2022-09-03 ENCOUNTER — Ambulatory Visit (INDEPENDENT_AMBULATORY_CARE_PROVIDER_SITE_OTHER): Payer: 59 | Admitting: Gastroenterology

## 2022-09-03 ENCOUNTER — Encounter (INDEPENDENT_AMBULATORY_CARE_PROVIDER_SITE_OTHER): Payer: Self-pay | Admitting: Gastroenterology

## 2022-09-03 VITALS — BP 98/61 | HR 66 | Temp 97.6°F | Ht 68.0 in | Wt 204.4 lb

## 2022-09-03 DIAGNOSIS — R935 Abnormal findings on diagnostic imaging of other abdominal regions, including retroperitoneum: Secondary | ICD-10-CM | POA: Diagnosis not present

## 2022-09-03 DIAGNOSIS — K632 Fistula of intestine: Secondary | ICD-10-CM

## 2022-09-03 NOTE — Patient Instructions (Signed)
Schedule colonoscopy Perform blood and stool workup

## 2022-09-03 NOTE — Progress Notes (Signed)
Kelly Savage, M.D. Gastroenterology & Hepatology [facility] [address] 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  History of Present Illness: Kelly Savage is a 67 y.o. 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  The patient was admitted on 09/25/2021 at [facility] after presenting worsening abdominal pain.  A CT of the abdomen showed incarcerated small bowel with partial small bowel obstruction for which she underwent partial small bowel resection and ventral hernioraphy by Dr. Lovell Sheehan.  Pathology showed strangulated segment of small bowel with early ischemic changes and vascular ectasia in the submucosa with dense fibrous serosal adhesions, there was presence of partially infarcted segment of the omentum.  The patient was admitted to [facility] on 05/27/2022 to recurrent abdominal pain.  At that time she was found to have an inflamed small bowel segment in the mid abdomen with a complex inflammatory lesion possibly representing an early abscess, as well as presence of moderately thickened and inflamed mid transverse colon above the inflammatory process.  She was given IV antibiotics.  Repeat CT of the abdomen and pelvis with IV contrast was performed 06/03/2022 which showed increased size of the phlegmon in the omentum and anterior abdominal wall.  Due to this, the patient was admitted to [facility] again on 06/05/2022.  The patient underwent an exploratory laparotomy by Dr. Henreitta Leber was found to have a phlegmon encircling the small bowel with presence of a fistula, she underwent  partial omentectomy and small bowel/fistula resection were performed with negative cultures (patient had been on antibiotics prior to that).  Notably, the rest of the small bowel seemed to be healthy. Pathology of the resected small bowel and partial omentum showed fibrous serosal adhesions with marked acute fibrinopurulent abnormalities compatible with perforation and fibrosis.  There was presence of an abscess.  Negative for malignancy or other abnormalities.  Patinet reports she remembers having some drainage in her abdominal wall after her surgery in May. States that even though it was cauterized, she still had some drainage in her skin so she believes the perforated bowel could be related to her surgery in May.  Total amount of resected small bowel: 23 cm (first surgery) and 10 cm (second surgery).  States after she had her wound were packed, she eventually healed and does not have any discharge anymore. Denies having any complaint at the moment, but sometimes has some tenderness around the abdominal scar area. The patient denies having any nausea, vomiting, fever, chills, hematochezia, melena, hematemesis, abdominal distention, abdominal pain, diarrhea, jaundice, pruritus or weight loss. She is having 1 BM per day, which is similar to her baseline prior to 2023.  Last QMV:HQION Last Colonoscopy:2012, diverticulosis in the sigmoid and left colon, external hemorrhoids Cologuard was negative.  FHx: sister died from untreated hepatitis C, neg for any gastrointestinal/liver disease Social: neg smoking, alcohol or illicit drug use Surgical: appendectomy, cholecystectomy and the two abdominal surgeries mentioned above  Past Medical History: Past Medical History:  Diagnosis Date   Ankle pain x3 months   bilateral   Diabetes mellitus type II 2006   Diabetes mellitus without complication (HCC)    Phreesia 07/11/2020  Obesity all her life    lost 28 lbs in 14 weeks    Primary localized  osteoarthritis of knee    Left   Primary localized osteoarthritis of left knee 09/23/2017   Right thigh pain    Thigh pain x 1 year    bilateral, esp when laying down, worse in the morning , and goes away with activity    Past Surgical History: Past Surgical History:  Procedure Laterality Date   APPENDECTOMY  2000   BOWEL RESECTION N/A 09/26/2021   Procedure: PARTIAL SMALL BOWEL RESECTION;  Surgeon: Franky Macho, MD;  Location: [facility];  Service: General;  Laterality: N/A;   BOWEL RESECTION  06/05/2022   Procedure: SMALL BOWEL RESECTION;  Surgeon: Lucretia Roers, MD;  Location: [facility];  Service: General;;   Cholecysectomy  1998   CHOLECYSTECTOMY     INCISIONAL HERNIA REPAIR N/A 09/26/2021   Procedure: HERNIORRHAPHY INCISIONAL;  Surgeon: Franky Macho, MD;  Location: [facility];  Service: General;  Laterality: N/A;   LAPAROTOMY N/A 06/05/2022   Procedure: EXPLORATORY LAPAROTOMY WITH OMENECTOMY;  Surgeon: Lucretia Roers, MD;  Location: [facility];  Service: General;  Laterality: N/A;   TONSILLECTOMY     in childhood   TOTAL KNEE ARTHROPLASTY Left 10/18/2017   TOTAL KNEE ARTHROPLASTY Left 10/18/2017   Procedure: TOTAL KNEE ARTHROPLASTY;  Surgeon: Salvatore Marvel, MD;  Location: [facility];  Service: Orthopedics;  Laterality: Left;    Family History: Family History  Problem Relation Age of Onset   Heart failure Mother 75       CVA (secondary to afib    Hyperlipidemia Father    Hypertension Father    Cancer Maternal Grandfather        colon    Diabetes Maternal Grandmother     Social History: Social History   Tobacco Use  Smoking Status Never  Smokeless Tobacco Never   Social History   Substance and Sexual Activity  Alcohol Use Not Currently   Social History   Substance and Sexual Activity  Drug Use Never    Allergies: No Known Allergies  Medications: Current Outpatient Medications  Medication Sig Dispense Refill   aspirin EC 81 MG tablet Take 1 tablet (81 mg total) by  mouth daily. 30 tablet 11   benazepril (LOTENSIN) 5 MG tablet Take 1 tablet (5 mg total) by mouth daily. 90 tablet 2   blood glucose meter kit and supplies Dispense based on patient and insurance preference. Three times daily testing dx e11.65 1 each 0   calcium carbonate (OSCAL) 1500 (600 Ca) MG TABS tablet Take 1,200 mg by mouth every evening.     cholecalciferol (VITAMIN D3) 25 MCG (1000 UNIT) tablet Take 1,000 Units by mouth daily. 2000 IU daily     diclofenac (VOLTAREN) 75 MG EC tablet Take 1 tablet (75 mg total) by mouth daily as needed for arthritic pain 90 tablet 0   empagliflozin (JARDIANCE) 10 MG TABS tablet Take 1 tablet (10 mg total) by mouth daily before breakfast. 90 tablet 2   glipiZIDE (GLUCOTROL XL) 10 MG 24 hr tablet Take 2 tablets (20 mg total) by mouth in the morning at breakfast 180 tablet 1   glucose blood (FREESTYLE LITE) test strip Use as instructed to test blood sugar once daily. 100 each 11   Lancets (FREESTYLE) lancets Use as instructed to test blood sugar once daily. 100 each 2   rosuvastatin (CRESTOR) 20 MG tablet Take 1 tablet (20 mg total) by mouth  daily. 90 tablet 3   SitaGLIPtin-MetFORMIN HCl (JANUMET XR) 50-1000 MG TB24 Take 1 tablet by mouth 2 (two) times daily. 60 tablet 11   No current facility-administered medications for this visit.    Review of Systems: GENERAL: negative for malaise, night sweats HEENT: No changes in hearing or vision, no nose bleeds or other nasal problems. NECK: Negative for lumps, goiter, pain and significant neck swelling RESPIRATORY: Negative for cough, wheezing CARDIOVASCULAR: Negative for chest pain, leg swelling, palpitations, orthopnea GI: SEE HPI MUSCULOSKELETAL: Negative for joint pain or swelling, back pain, and muscle pain. SKIN: Negative for lesions, rash PSYCH: Negative for sleep disturbance, mood disorder and recent psychosocial stressors. HEMATOLOGY Negative for prolonged bleeding, bruising easily, and swollen  nodes. ENDOCRINE: Negative for cold or heat intolerance, polyuria, polydipsia and goiter. NEURO: negative for tremor, gait imbalance, syncope and seizures. The remainder of the review of systems is noncontributory.   Physical Exam: BP 98/61   Pulse 66   Temp 97.6 F (36.4 C)   Ht 5\' 8"  (1.727 m)   Wt 204 lb 6.4 oz (92.7 kg)   BMI 31.08 kg/m  GENERAL: The patient is AO x3, in no acute distress. HEENT: Head is normocephalic and atraumatic. EOMI are intact. Mouth is well hydrated and without lesions. NECK: Supple. No masses LUNGS: Clear to auscultation. No presence of rhonchi/wheezing/rales. Adequate chest expansion HEART: RRR, normal s1 and s2. ABDOMEN: Soft, nontender, no guarding, no peritoneal signs, and nondistended. BS +. No masses. Has midline scar. EXTREMITIES: Without any cyanosis, clubbing, rash, lesions or edema. NEUROLOGIC: AOx3, no focal motor deficit. SKIN: no jaundice, no rashes   Imaging/Labs: as above  I personally reviewed and interpreted the available labs, imaging and endoscopic files.  Impression and Plan: PALOMA GRANGE is a 67 y.o. 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 Patient had presence of small bowel perforation with chronic fistulous changes and formation of phlegmon. Etiology of this is unclear, but pathology did not show any granulomatous or chronic inflammatory changes in the specimen. Unclear if this event could have been related to previous surgical changes or whether there is a component of Crohn's disease due to this. Will check blood workup including CRP and will check fecal calprotectin. Will also proceed with colonoscopy, especially given the inflammatory changes seen on the CT scan, showing inflammation in the transverse (I reviewed this, which could be related  to surrounding phlegmon).  - Schedule colonoscopy - Check CBC, CMP, CRP and fecal calprotectin - If abnormal calprotectin with normal colonoscopy, consider CT enterography.  All questions were answered.      Kelly Blazing, MD Gastroenterology and Hepatology [facility]

## 2022-09-04 ENCOUNTER — Telehealth (INDEPENDENT_AMBULATORY_CARE_PROVIDER_SITE_OTHER): Payer: Self-pay | Admitting: Gastroenterology

## 2022-09-04 NOTE — Telephone Encounter (Signed)
Pt in office yesterday and needing colonoscopy scheduled. Pt contacted and states she will need to call back when she has her schedule because she would like to have procedure done on her day off.

## 2022-09-07 ENCOUNTER — Other Ambulatory Visit: Payer: Self-pay

## 2022-09-07 ENCOUNTER — Other Ambulatory Visit (HOSPITAL_COMMUNITY): Payer: Self-pay

## 2022-09-07 MED ORDER — PEG 3350-KCL-NA BICARB-NACL 420 G PO SOLR
4000.0000 mL | Freq: Once | ORAL | 0 refills | Status: AC
  Filled 2022-09-07: qty 4000, 1d supply, fill #0

## 2022-09-07 NOTE — Telephone Encounter (Signed)
Pt returned call and colonoscopy scheduled. Instructions mailed to patient. Prep sent to pharmacy. Pt would like pre op scheduled for 10/28/22 if possible, sent message to [place] at Endo.

## 2022-09-07 NOTE — Addendum Note (Signed)
Addended by: Marlowe Shores on: 09/07/2022 03:23 PM   Modules accepted: Orders

## 2022-09-11 DIAGNOSIS — R935 Abnormal findings on diagnostic imaging of other abdominal regions, including retroperitoneum: Secondary | ICD-10-CM | POA: Diagnosis not present

## 2022-09-11 DIAGNOSIS — K632 Fistula of intestine: Secondary | ICD-10-CM | POA: Diagnosis not present

## 2022-09-16 ENCOUNTER — Encounter (INDEPENDENT_AMBULATORY_CARE_PROVIDER_SITE_OTHER): Payer: Self-pay

## 2022-09-18 LAB — CBC WITH DIFFERENTIAL/PLATELET
Basophils Absolute: 0.1 10*3/uL (ref 0.0–0.2)
Basos: 1 %
EOS (ABSOLUTE): 0.2 10*3/uL (ref 0.0–0.4)
Eos: 4 %
Hematocrit: 38.4 % (ref 34.0–46.6)
Hemoglobin: 12.4 g/dL (ref 11.1–15.9)
Immature Grans (Abs): 0 10*3/uL (ref 0.0–0.1)
Immature Granulocytes: 0 %
Lymphocytes Absolute: 1.2 10*3/uL (ref 0.7–3.1)
Lymphs: 21 %
MCH: 28.3 pg (ref 26.6–33.0)
MCHC: 32.3 g/dL (ref 31.5–35.7)
MCV: 88 fL (ref 79–97)
Monocytes Absolute: 0.3 10*3/uL (ref 0.1–0.9)
Monocytes: 6 %
Neutrophils Absolute: 3.8 10*3/uL (ref 1.4–7.0)
Neutrophils: 68 %
Platelets: 189 10*3/uL (ref 150–450)
RBC: 4.38 x10E6/uL (ref 3.77–5.28)
RDW: 14.8 % (ref 11.7–15.4)
WBC: 5.6 10*3/uL (ref 3.4–10.8)

## 2022-09-18 LAB — COMPREHENSIVE METABOLIC PANEL
ALT: 14 IU/L (ref 0–32)
AST: 16 IU/L (ref 0–40)
Albumin/Globulin Ratio: 1.8 (ref 1.2–2.2)
Albumin: 4.2 g/dL (ref 3.9–4.9)
Alkaline Phosphatase: 68 IU/L (ref 44–121)
BUN/Creatinine Ratio: 26 (ref 12–28)
BUN: 19 mg/dL (ref 8–27)
Bilirubin Total: 1 mg/dL (ref 0.0–1.2)
CO2: 21 mmol/L (ref 20–29)
Calcium: 9.5 mg/dL (ref 8.7–10.3)
Chloride: 103 mmol/L (ref 96–106)
Creatinine, Ser: 0.73 mg/dL (ref 0.57–1.00)
Globulin, Total: 2.4 g/dL (ref 1.5–4.5)
Glucose: 171 mg/dL — ABNORMAL HIGH (ref 70–99)
Potassium: 4.2 mmol/L (ref 3.5–5.2)
Sodium: 139 mmol/L (ref 134–144)
Total Protein: 6.6 g/dL (ref 6.0–8.5)
eGFR: 91 mL/min/{1.73_m2} (ref >59)

## 2022-09-18 LAB — CALPROTECTIN, FECAL: Calprotectin, Fecal: 139 ug/g — ABNORMAL HIGH (ref 0–120)

## 2022-09-18 LAB — C-REACTIVE PROTEIN: CRP: <1 mg/L (ref 0–10)

## 2022-09-29 ENCOUNTER — Other Ambulatory Visit: Payer: Self-pay | Admitting: Family Medicine

## 2022-09-30 ENCOUNTER — Other Ambulatory Visit (HOSPITAL_COMMUNITY): Payer: Self-pay

## 2022-09-30 MED ORDER — DICLOFENAC SODIUM 75 MG PO TBEC
75.0000 mg | DELAYED_RELEASE_TABLET | Freq: Every day | ORAL | 0 refills | Status: DC | PRN
  Filled 2022-09-30: qty 90, 90d supply, fill #0

## 2022-10-28 ENCOUNTER — Encounter (HOSPITAL_COMMUNITY)
Admission: RE | Admit: 2022-10-28 | Discharge: 2022-10-28 | Disposition: A | Discharge status: Home / Self Care | Payer: 59 | Source: Ambulatory Visit | Attending: Gastroenterology | Admitting: Gastroenterology

## 2022-10-28 ENCOUNTER — Other Ambulatory Visit (HOSPITAL_COMMUNITY): Payer: Self-pay

## 2022-10-28 DIAGNOSIS — I10 Essential (primary) hypertension: Secondary | ICD-10-CM | POA: Diagnosis not present

## 2022-10-28 DIAGNOSIS — E1165 Type 2 diabetes mellitus with hyperglycemia: Secondary | ICD-10-CM | POA: Diagnosis not present

## 2022-10-28 DIAGNOSIS — E7800 Pure hypercholesterolemia, unspecified: Secondary | ICD-10-CM | POA: Diagnosis not present

## 2022-10-28 DIAGNOSIS — E669 Obesity, unspecified: Secondary | ICD-10-CM | POA: Diagnosis not present

## 2022-10-28 MED ORDER — INSULIN PEN NEEDLE 32G X 4 MM MISC
5 refills | Status: AC
  Filled 2022-10-28: qty 100, 90d supply, fill #0
  Filled 2023-01-14: qty 100, 90d supply, fill #1
  Filled 2023-04-16: qty 100, 90d supply, fill #2
  Filled 2023-05-17 – 2023-05-23 (×2): qty 100, 90d supply, fill #3

## 2022-10-28 MED ORDER — TRESIBA FLEXTOUCH 100 UNIT/ML ~~LOC~~ SOPN
10.0000 [IU] | PEN_INJECTOR | Freq: Every day | SUBCUTANEOUS | 5 refills | Status: DC
  Filled 2022-10-28: qty 6, 56d supply, fill #0
  Filled 2022-11-27: qty 6, 56d supply, fill #1

## 2022-10-29 ENCOUNTER — Ambulatory Visit (HOSPITAL_COMMUNITY): Payer: 59 | Admitting: Certified Registered"

## 2022-10-29 ENCOUNTER — Ambulatory Visit (HOSPITAL_COMMUNITY)
Admission: RE | Admit: 2022-10-29 | Discharge: 2022-10-29 | Disposition: A | Discharge status: Home / Self Care | Payer: 59 | Attending: Gastroenterology | Admitting: Gastroenterology

## 2022-10-29 ENCOUNTER — Encounter (HOSPITAL_COMMUNITY)
Admission: RE | Disposition: A | Discharge status: Home / Self Care | Payer: Self-pay | Source: Home / Self Care | Attending: Gastroenterology

## 2022-10-29 DIAGNOSIS — I10 Essential (primary) hypertension: Secondary | ICD-10-CM | POA: Insufficient documentation

## 2022-10-29 DIAGNOSIS — K5730 Diverticulosis of large intestine without perforation or abscess without bleeding: Secondary | ICD-10-CM | POA: Insufficient documentation

## 2022-10-29 DIAGNOSIS — Z980 Intestinal bypass and anastomosis status: Secondary | ICD-10-CM | POA: Insufficient documentation

## 2022-10-29 DIAGNOSIS — Z1211 Encounter for screening for malignant neoplasm of colon: Secondary | ICD-10-CM | POA: Diagnosis not present

## 2022-10-29 DIAGNOSIS — K5090 Crohn's disease, unspecified, without complications: Secondary | ICD-10-CM | POA: Diagnosis not present

## 2022-10-29 DIAGNOSIS — K529 Noninfective gastroenteritis and colitis, unspecified: Secondary | ICD-10-CM | POA: Insufficient documentation

## 2022-10-29 DIAGNOSIS — K633 Ulcer of intestine: Secondary | ICD-10-CM

## 2022-10-29 DIAGNOSIS — Z9049 Acquired absence of other specified parts of digestive tract: Secondary | ICD-10-CM | POA: Diagnosis not present

## 2022-10-29 DIAGNOSIS — E119 Type 2 diabetes mellitus without complications: Secondary | ICD-10-CM | POA: Diagnosis not present

## 2022-10-29 HISTORY — PX: COLONOSCOPY WITH PROPOFOL: SHX5780

## 2022-10-29 HISTORY — PX: BIOPSY: SHX5522

## 2022-10-29 LAB — HM COLONOSCOPY

## 2022-10-29 LAB — GLUCOSE, CAPILLARY: Glucose-Capillary: 177 mg/dL — ABNORMAL HIGH (ref 70–99)

## 2022-10-29 LAB — HEPATITIS B SURFACE ANTIGEN: Hepatitis B Surface Ag: NONREACTIVE

## 2022-10-29 SURGERY — COLONOSCOPY WITH PROPOFOL
Anesthesia: General

## 2022-10-29 MED ORDER — LIDOCAINE HCL (PF) 2 % IJ SOLN
INTRAMUSCULAR | Status: AC
  Filled 2022-10-29: qty 10

## 2022-10-29 MED ORDER — PROPOFOL 500 MG/50ML IV EMUL
INTRAVENOUS | Status: DC | PRN
  Administered 2022-10-29: 150 ug/kg/min via INTRAVENOUS

## 2022-10-29 MED ORDER — EPHEDRINE SULFATE-NACL 50-0.9 MG/10ML-% IV SOSY
PREFILLED_SYRINGE | INTRAVENOUS | Status: DC | PRN
  Administered 2022-10-29: 10 mg via INTRAVENOUS

## 2022-10-29 MED ORDER — PROPOFOL 10 MG/ML IV BOLUS
INTRAVENOUS | Status: DC | PRN
  Administered 2022-10-29: 100 mg via INTRAVENOUS

## 2022-10-29 MED ORDER — ATROPINE SULFATE 0.4 MG/ML IV SOLN
INTRAVENOUS | Status: DC | PRN
  Administered 2022-10-29: .4 mg via INTRAVENOUS

## 2022-10-29 MED ORDER — LACTATED RINGERS IV SOLN: INTRAVENOUS | Status: DC | PRN

## 2022-10-29 MED ORDER — ATROPINE SULFATE 1 MG/ML IV SOLN
INTRAVENOUS | Status: AC
  Filled 2022-10-29: qty 1

## 2022-10-29 MED ORDER — PHENYLEPHRINE 80 MCG/ML (10ML) SYRINGE FOR IV PUSH (FOR BLOOD PRESSURE SUPPORT)
PREFILLED_SYRINGE | INTRAVENOUS | Status: DC | PRN
  Administered 2022-10-29 (×2): 160 ug via INTRAVENOUS

## 2022-10-29 MED ORDER — PROPOFOL 500 MG/50ML IV EMUL
INTRAVENOUS | Status: AC
  Filled 2022-10-29: qty 50

## 2022-10-29 MED ORDER — GLYCOPYRROLATE PF 0.2 MG/ML IJ SOSY
PREFILLED_SYRINGE | INTRAMUSCULAR | Status: DC | PRN
  Administered 2022-10-29: .2 mg via INTRAVENOUS

## 2022-10-29 MED ORDER — LIDOCAINE HCL (CARDIAC) PF 100 MG/5ML IV SOSY
PREFILLED_SYRINGE | INTRAVENOUS | Status: DC | PRN
  Administered 2022-10-29: 80 mg via INTRAVENOUS

## 2022-10-29 NOTE — Transfer of Care (Signed)
Immediate Anesthesia Transfer of Care Note  Patient: Kelly Savage  Procedure(s) Performed: COLONOSCOPY WITH PROPOFOL BIOPSY  Patient Location: PACU  Anesthesia Type:General  Level of Consciousness: awake and patient cooperative  Airway & Oxygen Therapy: Patient Spontanous Breathing  Post-op Assessment: Report given to RN and Post -op Vital signs reviewed and stable  Post vital signs: Reviewed and stable  Last Vitals:  Vitals Value Taken Time  BP    Temp    Pulse    Resp    SpO2      Last Pain:  Vitals:   10/29/22 1149  PainSc: 0-No pain         Complications: No notable events documented.

## 2022-10-29 NOTE — Discharge Instructions (Signed)
You are being discharged to home.  Resume your previous diet.  We are waiting for your pathology results.  Your physician has recommended a repeat colonoscopy for surveillance based on your clinical status at that time.  Stop using high dose aspirin including Goody/BC powders, NSAIDs such as diclofenac,  Aleve, ibuprofen, naproxen, Motrin, Voltaren or Advil (even the topical ones)

## 2022-10-29 NOTE — Op Note (Signed)
[facility] Patient Name: Kelly Savage Procedure Date: 10/29/2022 11:39 AM MRN: [id number] Date of Birth: [birth year] Attending MD: Katrinka Blazing , , [id number] CSN: [id number] Age: 67 Admit Type: Outpatient Procedure:                Colonoscopy Indications:              Screening for colorectal malignant neoplasm Providers:                Katrinka Blazing, Angelica Ran, Zena Amos Referring MD:              Medicines:                Monitored Anesthesia Care Complications:            No immediate complications. Estimated Blood Loss:     Estimated blood loss: none. Procedure:                Pre-Anesthesia Assessment:                           - Prior to the procedure, a History and Physical                            was performed, and patient medications, allergies                            and sensitivities were reviewed. The patient's                            tolerance of previous anesthesia was reviewed.                           - The risks and benefits of the procedure and the                            sedation options and risks were discussed with the                            patient. All questions were answered and informed                            consent was obtained.                           - ASA Grade Assessment: II - A patient with mild                            systemic disease.                           After obtaining informed consent, the colonoscope                            was passed under direct vision. Throughout the                            procedure, the patient's blood pressure,  pulse, and                            oxygen saturations were monitored continuously. The                            [id number]-[id number] ([id number]) was introduced through the                            anus and advanced to the the terminal ileum. The                            colonoscopy was performed without difficulty. The                            patient tolerated  the procedure well. The quality                            of the bowel preparation was adequate. Scope In: 12:01:49 PM Scope Out: 12:28:49 PM Scope Withdrawal Time: 0 hours 18 minutes 24 seconds  Total Procedure Duration: 0 hours 27 minutes 0 seconds  Findings:      The perianal and digital rectal examinations were normal.      The terminal ileum appeared normal. There was presence of a healthy       ileoleal anastomosis. Biopsies were taken with a cold forceps for       histology.      Inflammation characterized by aphthous ulcerations was found as patches       surrounded by normal mucosa in the transverse colon, in the ascending       colon and at the cecum. The rectum, the sigmoid colon and the descending       colon were spared. The inflammation was mild in severity.      A few medium-mouthed diverticula were found in the sigmoid colon.      The retroflexed view of the distal rectum and anal verge was normal and       showed no anal or rectal abnormalities. Impression:               - The examined portion of the ileum was normal.                            Biopsied.                           - Inflammation was found in the transverse colon,                            in the ascending colon and at the cecum suggestive                            of Crohn's disease. This was mild in severity.                           - Diverticulosis in the sigmoid colon.                           -  The distal rectum and anal verge are normal on                            retroflexion view. Moderate Sedation:      Per Anesthesia Care Recommendation:           - Discharge patient to home (ambulatory).                           - Resume previous diet.                           - Await pathology results.                           - Repeat colonoscopy for surveillance based on                            clinical status at that time.                           - Stop using high dose aspirin including  Goody/BC                            powders, NSAIDs such as diclofenac, Aleve,                            ibuprofen, naproxen, Motrin, Voltaren or Advil                            (even the topical ones) Procedure Code(s):        --- Professional ---                           [phone], Colonoscopy, flexible; with biopsy, single                            or multiple Diagnosis Code(s):        --- Professional ---                           Z12.11, Encounter for screening for malignant                            neoplasm of colon                           K50.90, Crohn's disease, unspecified, without                            complications                           K57.30, Diverticulosis of large intestine without                            perforation or abscess without bleeding CPT copyright 2022 [employer]. All rights  reserved. The codes documented in this report are preliminary and upon coder review may  be revised to meet current compliance requirements. Katrinka Blazing, MD Katrinka Blazing,  10/29/2022 12:39:13 PM This report has been signed electronically. Number of Addenda: 0

## 2022-10-29 NOTE — H&P (Signed)
Kelly Savage is an 67 y.o. female.   Chief Complaint: CRC screening. HPI: Kelly Savage is a 67 y.o. 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  The patient denies having any nausea, vomiting, fever, chills, hematochezia, melena, hematemesis, abdominal distention, abdominal pain, diarrhea, jaundice, pruritus or weight loss.  No family history of colon cancer.  Last colonoscopy in 2012 did not show any abnormalities.  Past Medical History:  Diagnosis Date   Ankle pain x3 months   bilateral   Diabetes mellitus type II 2006   Diabetes mellitus without complication (HCC)    [employer] 07/11/2020   Obesity all her life    lost 28 lbs in 14 weeks    Primary localized osteoarthritis of knee    Left   Primary localized osteoarthritis of left knee 09/23/2017   Right thigh pain    Thigh pain x 1 year    bilateral, esp when laying down, worse in the morning , and goes away with activity    Past Surgical History:  Procedure Laterality Date   APPENDECTOMY  2000   BOWEL RESECTION N/A 09/26/2021   Procedure: PARTIAL SMALL BOWEL RESECTION;  Surgeon: Franky Macho, MD;  Location: [facility];  Service: General;  Laterality: N/A;   BOWEL RESECTION  06/05/2022   Procedure: SMALL BOWEL RESECTION;  Surgeon: Lucretia Roers, MD;  Location: [facility];  Service: General;;   Cholecysectomy  1998   CHOLECYSTECTOMY     INCISIONAL HERNIA REPAIR N/A 09/26/2021   Procedure: HERNIORRHAPHY INCISIONAL;  Surgeon: Franky Macho, MD;  Location: [facility];  Service: General;  Laterality: N/A;   LAPAROTOMY N/A 06/05/2022   Procedure: EXPLORATORY LAPAROTOMY WITH OMENECTOMY;  Surgeon: Lucretia Roers, MD;  Location: [facility];  Service: General;  Laterality: N/A;   TONSILLECTOMY     in childhood   TOTAL KNEE ARTHROPLASTY Left 10/18/2017   TOTAL KNEE ARTHROPLASTY Left 10/18/2017   Procedure:  TOTAL KNEE ARTHROPLASTY;  Surgeon: Salvatore Marvel, MD;  Location: [facility];  Service: Orthopedics;  Laterality: Left;    Family History  Problem Relation Age of Onset   Heart failure Mother 68       CVA (secondary to afib    Hyperlipidemia Father    Hypertension Father    Cancer Maternal Grandfather        colon    Diabetes Maternal Grandmother    Social History:  reports that she has never smoked. She has never used smokeless tobacco. She reports that she does not currently use alcohol. She reports that she does not use drugs.  Allergies: No Known Allergies  Medications Prior to Admission  Medication Sig Dispense Refill   benazepril (LOTENSIN) 5 MG tablet Take 1 tablet (5 mg total) by mouth daily. 90 tablet 2   calcium carbonate (OSCAL) 1500 (600 Ca) MG TABS tablet Take 1,200 mg by mouth every evening.     cholecalciferol (VITAMIN D3) 25 MCG (1000 UNIT) tablet Take 1,000 Units by mouth daily. 2000 IU daily     diclofenac (VOLTAREN) 75 MG EC tablet Take 1 tablet (75 mg total) by mouth daily as needed for arthritic pain 90 tablet 0   glipiZIDE (GLUCOTROL XL) 10 MG 24 hr tablet Take 2 tablets (20 mg total) by mouth in the morning at breakfast 180 tablet 1   rosuvastatin (CRESTOR) 20 MG tablet Take 1 tablet (20 mg total)  by mouth daily. 90 tablet 3   aspirin EC 81 MG tablet Take 1 tablet (81 mg total) by mouth daily. 30 tablet 11   blood glucose meter kit and supplies Dispense based on patient and insurance preference. Three times daily testing dx e11.65 1 each 0   empagliflozin (JARDIANCE) 10 MG TABS tablet Take 1 tablet (10 mg total) by mouth daily before breakfast. 90 tablet 2   glucose blood (FREESTYLE LITE) test strip Use as instructed to test blood sugar once daily. 100 each 11   insulin degludec (TRESIBA FLEXTOUCH) 100 UNIT/ML FlexTouch Pen Inject 10 Units into the skin daily. 6 mL 5   Insulin Pen Needle 32G X 4 MM MISC use as directed once daily 100 each 5   Lancets (FREESTYLE)  lancets Use as instructed to test blood sugar once daily. 100 each 2   SitaGLIPtin-MetFORMIN HCl (JANUMET XR) 50-1000 MG TB24 Take 1 tablet by mouth 2 (two) times daily. 60 tablet 11    Results for orders placed or performed during the hospital encounter of 10/29/22 (from the past 48 hour(s))  Glucose, capillary     Status: Abnormal   Collection Time: 10/29/22  9:39 AM  Result Value Ref Range   Glucose-Capillary 177 (H) 70 - 99 mg/dL    Comment: Glucose reference range applies only to samples taken after fasting for at least 8 hours.   No results found.  Review of Systems  All other systems reviewed and are negative.   There were no vitals taken for this visit. Physical Exam  GENERAL: The patient is AO x3, in no acute distress. HEENT: Head is normocephalic and atraumatic. EOMI are intact. Mouth is well hydrated and without lesions. NECK: Supple. No masses LUNGS: Clear to auscultation. No presence of rhonchi/wheezing/rales. Adequate chest expansion HEART: RRR, normal s1 and s2. ABDOMEN: Soft, nontender, no guarding, no peritoneal signs, and nondistended. BS +. No masses. EXTREMITIES: Without any cyanosis, clubbing, rash, lesions or edema. NEUROLOGIC: AOx3, no focal motor deficit. SKIN: no jaundice, no rashes  Assessment/Plan Kelly Savage is a 67 y.o. 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  Will proceed with colonoscopy.  Dolores Frame, MD 10/29/2022, 10:20 AM

## 2022-10-29 NOTE — Progress Notes (Signed)
Post precedure EKG completed and all labs drawn and sent to the lab.

## 2022-10-30 NOTE — Anesthesia Preprocedure Evaluation (Signed)
Anesthesia Evaluation  Patient identified by MRN, date of birth, ID band Patient awake    Reviewed: Allergy & Precautions, H&P , NPO status , Patient's Chart, lab work & pertinent test results, reviewed documented beta blocker date and time   Airway Mallampati: II  TM Distance: >3 FB Neck ROM: full    Dental no notable dental hx.    Pulmonary neg pulmonary ROS   Pulmonary exam normal breath sounds clear to auscultation       Cardiovascular Exercise Tolerance: Good hypertension, negative cardio ROS  Rhythm:regular Rate:Normal     Neuro/Psych  Neuromuscular disease negative neurological ROS  negative psych ROS   GI/Hepatic negative GI ROS, Neg liver ROS,,,  Endo/Other  negative endocrine ROSdiabetes    Renal/GU negative Renal ROS  negative genitourinary   Musculoskeletal   Abdominal   Peds  Hematology negative hematology ROS (+)   Anesthesia Other Findings   Reproductive/Obstetrics negative OB ROS                             Anesthesia Physical Anesthesia Plan  ASA: 2  Anesthesia Plan: General   Post-op Pain Management:    Induction:   PONV Risk Score and Plan: Propofol infusion  Airway Management Planned:   Additional Equipment:   Intra-op Plan:   Post-operative Plan:   Informed Consent: I have reviewed the patients History and Physical, chart, labs and discussed the procedure including the risks, benefits and alternatives for the proposed anesthesia with the patient or authorized representative who has indicated his/her understanding and acceptance.     Dental Advisory Given  Plan Discussed with: CRNA  Anesthesia Plan Comments:        Anesthesia Quick Evaluation

## 2022-11-02 ENCOUNTER — Encounter (INDEPENDENT_AMBULATORY_CARE_PROVIDER_SITE_OTHER): Payer: Self-pay | Admitting: *Deleted

## 2022-11-02 LAB — SURGICAL PATHOLOGY

## 2022-11-02 NOTE — Anesthesia Postprocedure Evaluation (Signed)
Anesthesia Post Note  Patient: Kelly Savage  Procedure(s) Performed: COLONOSCOPY WITH PROPOFOL BIOPSY  Patient location during evaluation: Phase II Anesthesia Type: General Level of consciousness: awake Pain management: pain level controlled Vital Signs Assessment: post-procedure vital signs reviewed and stable Respiratory status: spontaneous breathing and respiratory function stable Cardiovascular status: blood pressure returned to baseline and stable Postop Assessment: no headache and no apparent nausea or vomiting Anesthetic complications: no Comments: Late entry   No notable events documented.   Last Vitals:  Vitals:   10/29/22 1236  BP: 127/83  Pulse: 78  Resp: 12  Temp: 36.5 C  SpO2: 99%    Last Pain:  Vitals:   10/30/22 1324  PainSc: 0-No pain                 Windell Norfolk

## 2022-11-04 ENCOUNTER — Encounter (HOSPITAL_COMMUNITY): Payer: Self-pay | Admitting: Gastroenterology

## 2022-11-04 LAB — THIOPURINE METHYLTRANSFERASE (TPMT), RBC: TPMT Activity:: 21.8 Units/mL RBC

## 2022-11-04 LAB — QUANTIFERON-TB GOLD PLUS (RQFGPL)
QuantiFERON Mitogen Value: >10 IU/mL
QuantiFERON Nil Value: 0.12 IU/mL
QuantiFERON TB1 Ag Value: 0.08 IU/mL
QuantiFERON TB2 Ag Value: 0.07 IU/mL

## 2022-11-04 LAB — QUANTIFERON-TB GOLD PLUS: QuantiFERON-TB Gold Plus: NEGATIVE

## 2022-11-06 ENCOUNTER — Other Ambulatory Visit (HOSPITAL_COMMUNITY): Payer: Self-pay

## 2022-11-06 ENCOUNTER — Other Ambulatory Visit: Payer: Self-pay | Admitting: Family Medicine

## 2022-11-06 MED ORDER — BENAZEPRIL HCL 5 MG PO TABS
5.0000 mg | ORAL_TABLET | Freq: Every day | ORAL | 2 refills | Status: DC
  Filled 2022-11-06: qty 90, 90d supply, fill #0
  Filled 2023-01-26: qty 90, 90d supply, fill #1
  Filled 2023-05-04: qty 90, 90d supply, fill #2

## 2022-11-10 ENCOUNTER — Other Ambulatory Visit: Payer: Self-pay

## 2022-11-10 DIAGNOSIS — E785 Hyperlipidemia, unspecified: Secondary | ICD-10-CM

## 2022-11-10 DIAGNOSIS — E1159 Type 2 diabetes mellitus with other circulatory complications: Secondary | ICD-10-CM

## 2022-11-10 DIAGNOSIS — E559 Vitamin D deficiency, unspecified: Secondary | ICD-10-CM

## 2022-11-20 DIAGNOSIS — H5203 Hypermetropia, bilateral: Secondary | ICD-10-CM | POA: Diagnosis not present

## 2022-11-20 LAB — HM DIABETES EYE EXAM

## 2022-11-27 ENCOUNTER — Other Ambulatory Visit (HOSPITAL_COMMUNITY): Payer: Self-pay

## 2022-12-02 ENCOUNTER — Other Ambulatory Visit (HOSPITAL_COMMUNITY): Payer: Self-pay

## 2022-12-02 DIAGNOSIS — E7800 Pure hypercholesterolemia, unspecified: Secondary | ICD-10-CM | POA: Diagnosis not present

## 2022-12-02 DIAGNOSIS — I10 Essential (primary) hypertension: Secondary | ICD-10-CM | POA: Diagnosis not present

## 2022-12-02 DIAGNOSIS — E1165 Type 2 diabetes mellitus with hyperglycemia: Secondary | ICD-10-CM | POA: Diagnosis not present

## 2022-12-02 DIAGNOSIS — E669 Obesity, unspecified: Secondary | ICD-10-CM | POA: Diagnosis not present

## 2022-12-02 MED ORDER — TRESIBA FLEXTOUCH 100 UNIT/ML ~~LOC~~ SOPN
25.0000 [IU] | PEN_INJECTOR | Freq: Every day | SUBCUTANEOUS | 5 refills | Status: DC
  Filled 2022-12-02 (×2): qty 15, 30d supply, fill #0
  Filled 2023-01-13: qty 15, 30d supply, fill #1
  Filled 2023-03-10: qty 15, 30d supply, fill #2
  Filled 2023-04-20: qty 15, 30d supply, fill #3
  Filled 2023-06-13: qty 15, 30d supply, fill #4
  Filled 2023-09-06: qty 15, 30d supply, fill #5

## 2022-12-03 ENCOUNTER — Other Ambulatory Visit: Payer: Self-pay

## 2022-12-24 ENCOUNTER — Other Ambulatory Visit (HOSPITAL_COMMUNITY): Payer: Self-pay

## 2022-12-24 ENCOUNTER — Encounter: Payer: 59 | Admitting: Family Medicine

## 2022-12-24 ENCOUNTER — Other Ambulatory Visit: Payer: Self-pay | Admitting: Family Medicine

## 2022-12-24 DIAGNOSIS — E1159 Type 2 diabetes mellitus with other circulatory complications: Secondary | ICD-10-CM

## 2022-12-24 MED ORDER — FREESTYLE LITE TEST VI STRP
ORAL_STRIP | 11 refills | Status: AC
Start: 2022-12-24 — End: ?
  Filled 2022-12-24: qty 100, 90d supply, fill #0
  Filled 2023-04-09: qty 100, 90d supply, fill #1
  Filled 2023-05-17 – 2023-05-23 (×2): qty 100, 90d supply, fill #2

## 2023-01-05 DIAGNOSIS — E785 Hyperlipidemia, unspecified: Secondary | ICD-10-CM | POA: Diagnosis not present

## 2023-01-05 DIAGNOSIS — E559 Vitamin D deficiency, unspecified: Secondary | ICD-10-CM | POA: Diagnosis not present

## 2023-01-05 DIAGNOSIS — E1159 Type 2 diabetes mellitus with other circulatory complications: Secondary | ICD-10-CM | POA: Diagnosis not present

## 2023-01-07 ENCOUNTER — Other Ambulatory Visit (HOSPITAL_COMMUNITY): Payer: Self-pay

## 2023-01-07 ENCOUNTER — Ambulatory Visit (INDEPENDENT_AMBULATORY_CARE_PROVIDER_SITE_OTHER): Payer: 59 | Admitting: Family Medicine

## 2023-01-07 ENCOUNTER — Encounter: Payer: Self-pay | Admitting: Family Medicine

## 2023-01-07 VITALS — BP 115/73 | HR 73 | Ht 68.0 in | Wt 216.1 lb

## 2023-01-07 DIAGNOSIS — Z0001 Encounter for general adult medical examination with abnormal findings: Secondary | ICD-10-CM

## 2023-01-07 DIAGNOSIS — M159 Polyosteoarthritis, unspecified: Secondary | ICD-10-CM | POA: Diagnosis not present

## 2023-01-07 DIAGNOSIS — Z780 Asymptomatic menopausal state: Secondary | ICD-10-CM

## 2023-01-07 DIAGNOSIS — Z23 Encounter for immunization: Secondary | ICD-10-CM

## 2023-01-07 DIAGNOSIS — Z0000 Encounter for general adult medical examination without abnormal findings: Secondary | ICD-10-CM

## 2023-01-07 DIAGNOSIS — E1159 Type 2 diabetes mellitus with other circulatory complications: Secondary | ICD-10-CM

## 2023-01-07 MED ORDER — TRAMADOL HCL 50 MG PO TABS
50.0000 mg | ORAL_TABLET | Freq: Four times a day (QID) | ORAL | 0 refills | Status: DC | PRN
Start: 2023-01-07 — End: 2023-04-08
  Filled 2023-01-07: qty 20, 5d supply, fill #0

## 2023-01-07 NOTE — Patient Instructions (Addendum)
F/u in 5 months, call if you need me sooner  Pls schedule and get your dexa, I have ordered the test there   Urine ACR today  Labs and bP are excellent  You are being referred to Cardiology Dr B christopher , [facility]  for Jab appt  It is important that you exercise regularly at least 30 minutes 5 times a week. If you develop chest pain, have severe difficulty breathing, or feel very tired, stop exercising immediately and seek medical attention   Thanks for choosing [facility], we consider it a privelige to serve you.

## 2023-01-08 ENCOUNTER — Other Ambulatory Visit (HOSPITAL_COMMUNITY): Payer: Self-pay

## 2023-01-08 MED ORDER — TRAMADOL HCL 50 MG PO TABS
50.0000 mg | ORAL_TABLET | Freq: Two times a day (BID) | ORAL | 2 refills | Status: DC | PRN
Start: 2023-01-16 — End: 2023-08-01
  Filled 2023-01-08: qty 60, fill #0
  Filled 2023-01-18: qty 60, 30d supply, fill #0
  Filled 2023-02-19: qty 60, 30d supply, fill #1
  Filled 2023-03-30: qty 60, 30d supply, fill #2

## 2023-01-08 MED ORDER — GABAPENTIN 100 MG PO CAPS
100.0000 mg | ORAL_CAPSULE | Freq: Two times a day (BID) | ORAL | 3 refills | Status: DC
Start: 2023-01-08 — End: 2023-04-08
  Filled 2023-01-08: qty 60, 30d supply, fill #0

## 2023-01-09 LAB — MICROALBUMIN / CREATININE URINE RATIO
Creatinine, Urine: 32.1 mg/dL
Microalb/Creat Ratio: 16 mg/g{creat} (ref 0–29)
Microalbumin, Urine: 5.2 ug/mL

## 2023-01-10 DIAGNOSIS — Z23 Encounter for immunization: Secondary | ICD-10-CM | POA: Insufficient documentation

## 2023-01-10 DIAGNOSIS — Z0000 Encounter for general adult medical examination without abnormal findings: Secondary | ICD-10-CM | POA: Insufficient documentation

## 2023-01-10 DIAGNOSIS — M159 Polyosteoarthritis, unspecified: Secondary | ICD-10-CM | POA: Insufficient documentation

## 2023-01-10 NOTE — Progress Notes (Signed)
Kelly Savage     MRN: [id number]      DOB: 05-16-1955  Chief Complaint  Patient presents with   Annual Exam    R-40 L-30 B-30 Stopped taking Voltaren, having arthritis pain and stiffness in lower back and rt. Hip.  Foot exam    Flu Vaccine    Flu vaccine given today -01/07/23    HPI: Patient is in for annual physical exam. C/o increased and uncontrolled generalized joint pain,unable to take NSAIDS due to colitis presumed to be from NSAIDS , has rept colonoscopy in next 3 months to reevaluate the problem C/o uncontrolled generalized pain and stiffness ranges from 8 to over 10 most days that she works Denies polyuria, polydipsia, blurred vision , or hypoglycemic episodes.     PE: BP 115/73 (BP Location: Left Arm, Patient Position: Sitting, Cuff Size: Large)   Pulse 73   Ht 5\' 8"  (1.727 m)   Wt 216 lb 1.3 oz (98 kg)   SpO2 97%   BMI 32.85 kg/m   Pleasant  female, alert and oriented x 3, in no cardio-pulmonary distress. Afebrile. HEENT No facial trauma or asymetry. Sinuses non tender.  Extra occullar muscles intact.. External ears normal, . Neck: supple, no adenopathy,JVD or thyromegaly.No bruits.  Chest: Clear to ascultation bilaterally.No crackles or wheezes. Non tender to palpation  Breast: Not examined  Cardiovascular system; Heart sounds normal,  S1 and  S2 ,no S3.  No murmur, or thrill. Peripheral pulses normal.  Abdomen: Soft, non tender, no organomegaly or masses. Bowe lsounds normal. No guarding, tenderness or rebound.   GU: Asymptomatic, not examined  Musculoskeletal exam: Decreased  ROM of spine, hips , shoulders and knees. deformity ,swelling or crepitus noted. No muscle wasting or atrophy.   Neurologic: Cranial nerves 2 to 12 intact. Power, tone ,sensation and reflexes normal throughout. Mild disturbance in gait. No tremor.  Skin: Intact, no ulceration, erythema , scaling or rash noted. Pigmentation normal  throughout  Psych; Normal mood and affect. Judgement and concentration normal   Assessment & Plan:  Encounter for annual physical exam Annual exam as documented. Counseling done  re healthy lifestyle involving commitment to 150 minutes exercise per week, heart healthy diet, and attaining healthy weight.The importance of adequate sleep also discussed. . Immunization and screening needs are specifically addressed at this visit.   Immunization due After obtaining informed consent, the influenza vaccine is  administered , with no adverse effect noted at the time of administration.   Generalized osteoarthritis Uncontrolled debilitating pain off of NSAIDS, will alternate tramadol with gabapentin, did message GI about choice of pain meds which are axceptable  Type 2 diabetes mellitus with vascular disease (HCC) Managed by Endo and controlled Kelly Savage is reminded of the importance of commitment to daily physical activity for 30 minutes or more, as able and the need to limit carbohydrate intake to 30 to 60 grams per meal to help with blood sugar control.   The need to take medication as prescribed, test blood sugar as directed, and to call between visits if there is a concern that blood sugar is uncontrolled is also discussed.   Kelly Savage is reminded of the importance of daily foot exam, annual eye examination, and good blood sugar, blood pressure and cholesterol control. Managed by Endo, has upcoming appt , reportedly well controlled     Latest Ref Rng & Units 01/07/2023    2:44 PM 01/05/2023    8:29 AM 09/11/2022    8:26  AM 06/09/2022    4:27 AM 06/08/2022    4:46 AM  Diabetic Labs  Micro/Creat Ratio 0 - 29 mg/g creat 16       Chol 100 - 199 mg/dL  119      HDL >14 mg/dL  67      Calc LDL 0 - 99 mg/dL  41      Triglycerides 0 - 149 mg/dL  97      Creatinine 7.82 - 1.00 mg/dL  9.56  2.13  0.86  5.78       01/07/2023    1:29 PM 10/29/2022   12:36 PM 09/03/2022   11:12 AM 07/02/2022    4:07  PM 06/17/2022    3:06 PM 06/09/2022    3:09 PM 06/09/2022    4:16 AM  BP/Weight  Systolic BP 115 127 98 113 115 97 114  Diastolic BP 73 83 61 73 73 61 47  Wt. (Lbs) 216.08  204.4 203 192    BMI 32.85 kg/m2  31.08 kg/m2 30.87 kg/m2 29.19 kg/m2        Latest Ref Rng & Units 11/20/2022   12:00 AM 12/19/2021   10:00 AM  Foot/eye exam completion dates  Eye Exam No Retinopathy No Retinopathy       Foot Form Completion   Done     This result is from an external source.

## 2023-01-10 NOTE — Assessment & Plan Note (Signed)
Uncontrolled debilitating pain off of NSAIDS, will alternate tramadol with gabapentin, did message GI about choice of pain meds which are axceptable

## 2023-01-10 NOTE — Assessment & Plan Note (Signed)
Managed by Endo and controlled Kelly Savage is reminded of the importance of commitment to daily physical activity for 30 minutes or more, as able and the need to limit carbohydrate intake to 30 to 60 grams per meal to help with blood sugar control.   The need to take medication as prescribed, test blood sugar as directed, and to call between visits if there is a concern that blood sugar is uncontrolled is also discussed.   Kelly Savage is reminded of the importance of daily foot exam, annual eye examination, and good blood sugar, blood pressure and cholesterol control. Managed by Endo, has upcoming appt , reportedly well controlled     Latest Ref Rng & Units 01/07/2023    2:44 PM 01/05/2023    8:29 AM 09/11/2022    8:26 AM 06/09/2022    4:27 AM 06/08/2022    4:46 AM  Diabetic Labs  Micro/Creat Ratio 0 - 29 mg/g creat 16       Chol 100 - 199 mg/dL  242      HDL >35 mg/dL  67      Calc LDL 0 - 99 mg/dL  41      Triglycerides 0 - 149 mg/dL  97      Creatinine 3.61 - 1.00 mg/dL  4.43  1.54  0.08  6.76       01/07/2023    1:29 PM 10/29/2022   12:36 PM 09/03/2022   11:12 AM 07/02/2022    4:07 PM 06/17/2022    3:06 PM 06/09/2022    3:09 PM 06/09/2022    4:16 AM  BP/Weight  Systolic BP 115 127 98 113 115 97 114  Diastolic BP 73 83 61 73 73 61 47  Wt. (Lbs) 216.08  204.4 203 192    BMI 32.85 kg/m2  31.08 kg/m2 30.87 kg/m2 29.19 kg/m2        Latest Ref Rng & Units 11/20/2022   12:00 AM 12/19/2021   10:00 AM  Foot/eye exam completion dates  Eye Exam No Retinopathy No Retinopathy       Foot Form Completion   Done     This result is from an external source.

## 2023-01-10 NOTE — Assessment & Plan Note (Signed)
Annual exam as documented. Counseling done  re healthy lifestyle involving commitment to 150 minutes exercise per week, heart healthy diet, and attaining healthy weight.The importance of adequate sleep also discussed. . Immunization and screening needs are specifically addressed at this visit.

## 2023-01-10 NOTE — Assessment & Plan Note (Signed)
After obtaining informed consent, the influenza vaccine is  administered , with no adverse effect noted at the time of administration.

## 2023-01-14 ENCOUNTER — Other Ambulatory Visit (HOSPITAL_COMMUNITY): Payer: Self-pay

## 2023-01-15 ENCOUNTER — Encounter: Payer: Self-pay | Admitting: Family Medicine

## 2023-01-15 ENCOUNTER — Other Ambulatory Visit: Payer: Self-pay | Admitting: Family Medicine

## 2023-01-15 ENCOUNTER — Other Ambulatory Visit (HOSPITAL_COMMUNITY): Payer: Self-pay

## 2023-01-15 DIAGNOSIS — M159 Polyosteoarthritis, unspecified: Secondary | ICD-10-CM

## 2023-01-18 ENCOUNTER — Other Ambulatory Visit (HOSPITAL_COMMUNITY): Payer: Self-pay

## 2023-01-18 ENCOUNTER — Other Ambulatory Visit: Payer: Self-pay

## 2023-01-26 ENCOUNTER — Other Ambulatory Visit: Payer: Self-pay | Admitting: Family Medicine

## 2023-01-27 ENCOUNTER — Other Ambulatory Visit (HOSPITAL_COMMUNITY): Payer: Self-pay

## 2023-01-27 MED ORDER — EMPAGLIFLOZIN 10 MG PO TABS
10.0000 mg | ORAL_TABLET | Freq: Every day | ORAL | 2 refills | Status: DC
  Filled 2023-01-27: qty 90, 90d supply, fill #0
  Filled 2023-05-04: qty 90, 90d supply, fill #1
  Filled 2023-07-28 – 2023-08-03 (×2): qty 90, 90d supply, fill #2
  Filled 2023-08-03: qty 30, 30d supply, fill #2

## 2023-01-28 ENCOUNTER — Other Ambulatory Visit (HOSPITAL_COMMUNITY): Payer: Self-pay

## 2023-02-10 ENCOUNTER — Ambulatory Visit (HOSPITAL_BASED_OUTPATIENT_CLINIC_OR_DEPARTMENT_OTHER)
Admission: RE | Admit: 2023-02-10 | Discharge: 2023-02-10 | Disposition: A | Discharge status: Home / Self Care | Payer: 59 | Source: Ambulatory Visit | Attending: Family Medicine | Admitting: Family Medicine

## 2023-02-10 DIAGNOSIS — Z780 Asymptomatic menopausal state: Secondary | ICD-10-CM | POA: Insufficient documentation

## 2023-02-19 ENCOUNTER — Other Ambulatory Visit: Payer: Self-pay

## 2023-02-24 ENCOUNTER — Encounter (INDEPENDENT_AMBULATORY_CARE_PROVIDER_SITE_OTHER): Payer: Self-pay | Admitting: Gastroenterology

## 2023-02-26 DIAGNOSIS — M419 Scoliosis, unspecified: Secondary | ICD-10-CM | POA: Diagnosis not present

## 2023-02-26 DIAGNOSIS — Z6832 Body mass index (BMI) 32.0-32.9, adult: Secondary | ICD-10-CM | POA: Diagnosis not present

## 2023-03-04 DIAGNOSIS — E1165 Type 2 diabetes mellitus with hyperglycemia: Secondary | ICD-10-CM | POA: Diagnosis not present

## 2023-03-04 DIAGNOSIS — E669 Obesity, unspecified: Secondary | ICD-10-CM | POA: Diagnosis not present

## 2023-03-04 DIAGNOSIS — E7800 Pure hypercholesterolemia, unspecified: Secondary | ICD-10-CM | POA: Diagnosis not present

## 2023-03-08 ENCOUNTER — Other Ambulatory Visit (HOSPITAL_COMMUNITY): Payer: Self-pay

## 2023-03-10 ENCOUNTER — Other Ambulatory Visit (HOSPITAL_COMMUNITY): Payer: Self-pay

## 2023-03-10 DIAGNOSIS — I10 Essential (primary) hypertension: Secondary | ICD-10-CM | POA: Diagnosis not present

## 2023-03-10 DIAGNOSIS — E669 Obesity, unspecified: Secondary | ICD-10-CM | POA: Diagnosis not present

## 2023-03-10 DIAGNOSIS — E1165 Type 2 diabetes mellitus with hyperglycemia: Secondary | ICD-10-CM | POA: Diagnosis not present

## 2023-03-10 DIAGNOSIS — E7800 Pure hypercholesterolemia, unspecified: Secondary | ICD-10-CM | POA: Diagnosis not present

## 2023-03-10 MED ORDER — FREESTYLE LITE W/DEVICE KIT
PACK | Freq: Three times a day (TID) | 0 refills | Status: DC
  Filled 2023-03-10: qty 1, 90d supply, fill #0

## 2023-03-10 MED ORDER — INSULIN LISPRO (1 UNIT DIAL) 100 UNIT/ML (KWIKPEN)
3.0000 [IU] | PEN_INJECTOR | Freq: Three times a day (TID) | SUBCUTANEOUS | 5 refills | Status: AC
  Filled 2023-03-10: qty 9, 75d supply, fill #0
  Filled 2023-05-23: qty 9, 75d supply, fill #1
  Filled 2023-09-06: qty 9, 75d supply, fill #2

## 2023-03-18 DIAGNOSIS — M5116 Intervertebral disc disorders with radiculopathy, lumbar region: Secondary | ICD-10-CM | POA: Diagnosis not present

## 2023-03-18 DIAGNOSIS — M5416 Radiculopathy, lumbar region: Secondary | ICD-10-CM | POA: Diagnosis not present

## 2023-03-29 ENCOUNTER — Encounter (INDEPENDENT_AMBULATORY_CARE_PROVIDER_SITE_OTHER): Payer: Self-pay | Admitting: *Deleted

## 2023-03-31 ENCOUNTER — Other Ambulatory Visit: Payer: Self-pay

## 2023-04-05 ENCOUNTER — Ambulatory Visit (INDEPENDENT_AMBULATORY_CARE_PROVIDER_SITE_OTHER): Payer: 59 | Admitting: Gastroenterology

## 2023-04-08 ENCOUNTER — Encounter (INDEPENDENT_AMBULATORY_CARE_PROVIDER_SITE_OTHER): Payer: Self-pay | Admitting: Gastroenterology

## 2023-04-08 ENCOUNTER — Other Ambulatory Visit (HOSPITAL_COMMUNITY): Payer: Self-pay

## 2023-04-08 ENCOUNTER — Ambulatory Visit (INDEPENDENT_AMBULATORY_CARE_PROVIDER_SITE_OTHER): Payer: 59 | Admitting: Gastroenterology

## 2023-04-08 VITALS — BP 107/69 | HR 72 | Temp 97.7°F | Ht 68.0 in | Wt 219.9 lb

## 2023-04-08 DIAGNOSIS — K529 Noninfective gastroenteritis and colitis, unspecified: Secondary | ICD-10-CM | POA: Diagnosis not present

## 2023-04-08 DIAGNOSIS — Z8719 Personal history of other diseases of the digestive system: Secondary | ICD-10-CM | POA: Diagnosis not present

## 2023-04-08 DIAGNOSIS — R933 Abnormal findings on diagnostic imaging of other parts of digestive tract: Secondary | ICD-10-CM | POA: Insufficient documentation

## 2023-04-08 NOTE — Progress Notes (Addendum)
Referring Provider: Kerri Perches, MD Primary Care Physician:  Kerri Perches, MD Primary GI Physician: Dr. Levon Hedger   Chief Complaint  Patient presents with   post procedure follow up    Follow up after colonoscopy back in March. Patient thinks she is due for another colonoscopy due to findings in March. She would like to schedule before next year if possible. Would like to discuss if ok for her to take a voltaren tablet occasional due to arthritis and spinal stenosis.    HPI:   Kelly Savage is a 67 y.o. 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  CRP, CBC, CMP WNL, calprotectin 139  Pertinent history: -admitted on 09/25/2021 at [facility] after presenting worsening abdominal pain.   -CT of the abdomen showed incarcerated small bowel with partial small bowel obstruction for which she underwent partial small bowel resection and ventral hernioraphy by Dr. Lovell Sheehan.   -Pathology showed strangulated segment of small bowel with early ischemic changes and vascular ectasia in the submucosa with dense fibrous serosal adhesions, there was presence of partially infarcted segment of the omentum.  -admitted to [facility] on 05/27/2022 to recurrent abdominal pain.  At that time she was found to have an inflamed small bowel segment in the mid abdomen with a complex inflammatory lesion possibly representing an early abscess, as well as presence of moderately thickened and inflamed mid transverse colon above the inflammatory process.  She was given IV antibiotics.    -repeat CT of the abdomen and pelvis with IV contrast was performed 06/03/2022 which showed increased size of the phlegmon in the omentum and anterior abdominal wall.   -admitted to [facility] again on 06/05/2022.  The patient underwent an exploratory laparotomy by Dr. Henreitta Leber was found to have a phlegmon encircling the small bowel with presence of a fistula, she underwent partial omentectomy and small bowel/fistula resection were performed with negative cultures (patient had been on antibiotics prior to that).  rest of the small bowel seemed to be healthy.  -Pathology of the resected small bowel and partial omentum showed fibrous serosal adhesions with marked acute fibrinopurulent abnormalities compatible with perforation and fibrosis.  There was presence of an abscess.  Negative for malignancy or other abnormalities. -Total amount of resected small bowel: 23 cm (first surgery) and 10 cm (second surgery).   Present: Patient states she is feeling okay, took about 4 voltaren pills since her colonoscopy. Denies other NSAID use. Having a BM daily, no constipation or diarrhea. No rectal bleeding or melena. No abdominal pain. Appetite is good. Weight is stable. She denies any nausea or vomiting. Uses tylenol on occasion, has history of chronic back pain and recently had a spinal injection which has helped some with her pain but she inquires if she can take voltaren on occasion when needed. She is also using tramadol PRN, uses rarely since her spinal injection.    Last Colonoscopy:10/2022- The examined portion of the ileum was normal.  Biopsied.                           - Inflammation was found in the transverse colon,                            in the ascending colon and at the cecum suggestive                            of Crohn's disease. This was mild in severity.                           - Diverticulosis in the sigmoid colon.                           - The distal  rectum and anal verge are normal on                            retroflexion view. Pathology showed normal small bowel biopsies, colonic biopsies showed focal active colitis without chronic glandular changes, repeat colonoscopy in 6 months for surveillance.  She should abstain from NSAIDs as these changes are likely related to these medications.  Last Endoscopy: never  Recommendations:    Past Medical History:  Diagnosis Date   Ankle pain x3 months   bilateral   Diabetes mellitus type II 2006   Diabetes mellitus without complication (HCC)    Phreesia 07/11/2020   Obesity all her life    lost 28 lbs in 14 weeks    Primary localized osteoarthritis of knee    Left   Primary localized osteoarthritis of left knee 09/23/2017   Right thigh pain    Thigh pain x 1 year    bilateral, esp when laying down, worse in the morning , and goes away with activity    Past Surgical History:  Procedure Laterality Date   APPENDECTOMY  2000   BIOPSY  10/29/2022   Procedure: BIOPSY;  Surgeon: Dolores Frame, MD;  Location: [facility];  Service: Gastroenterology;;   BOWEL RESECTION N/A 09/26/2021   Procedure: PARTIAL SMALL BOWEL RESECTION;  Surgeon: Franky Macho, MD;  Location: [facility] ORS;  Service: General;  Laterality: N/A;   BOWEL RESECTION  06/05/2022   Procedure: SMALL BOWEL RESECTION;  Surgeon: Lucretia Roers, MD;  Location: [facility];  Service: General;;   Cholecysectomy  1998   CHOLECYSTECTOMY     COLONOSCOPY WITH PROPOFOL N/A 10/29/2022   Procedure: COLONOSCOPY WITH PROPOFOL;  Surgeon: Dolores Frame, MD;  Location: [facility];  Service: Gastroenterology;  Laterality: N/A;  10:30AM; ASA 3   INCISIONAL HERNIA REPAIR N/A 09/26/2021   Procedure: HERNIORRHAPHY INCISIONAL;  Surgeon: Franky Macho, MD;  Location: [facility];  Service: General;  Laterality: N/A;   LAPAROTOMY N/A 06/05/2022   Procedure: EXPLORATORY LAPAROTOMY WITH OMENECTOMY;  Surgeon: Lucretia Roers, MD;  Location:  [facility];  Service: General;  Laterality: N/A;   TONSILLECTOMY     in childhood   TOTAL KNEE ARTHROPLASTY Left 10/18/2017   TOTAL KNEE ARTHROPLASTY Left 10/18/2017   Procedure: TOTAL KNEE ARTHROPLASTY;  Surgeon: Salvatore Marvel, MD;  Location: [facility] OR;  Service: Orthopedics;  Laterality: Left;    Current Outpatient Medications  Medication Sig Dispense Refill  benazepril (LOTENSIN) 5 MG tablet Take 1 tablet (5 mg total) by mouth daily. 90 tablet 2   Blood Glucose Monitoring Suppl (FREESTYLE LITE) w/Device KIT Use to check blood glucose 3 (three) times daily. 1 kit 0   calcium carbonate (OSCAL) 1500 (600 Ca) MG TABS tablet Take 1,200 mg by mouth every evening.     cholecalciferol (VITAMIN D3) 25 MCG (1000 UNIT) tablet Take 1,000 Units by mouth daily. 2000 IU daily     empagliflozin (JARDIANCE) 10 MG TABS tablet Take 1 tablet (10 mg total) by mouth daily before breakfast. 90 tablet 2   glucose blood (FREESTYLE LITE) test strip Use as instructed to test blood sugar once daily. 100 each 11   insulin degludec (TRESIBA FLEXTOUCH) 100 UNIT/ML FlexTouch Pen Inject 25 Units into the skin once daily as directed. Titrate up to 50 units daily max as directed 15 mL 5   insulin lispro (HUMALOG) 100 UNIT/ML KwikPen Inject 3-4 Units into the skin 3 (three) times daily before meals. 9 mL 5   Insulin Pen Needle 32G X 4 MM MISC use as directed once daily 100 each 5   Lancets (FREESTYLE) lancets Use as instructed to test blood sugar once daily. 100 each 2   rosuvastatin (CRESTOR) 20 MG tablet Take 1 tablet (20 mg total) by mouth daily. 90 tablet 3   SitaGLIPtin-MetFORMIN HCl (JANUMET XR) 50-1000 MG TB24 Take 1 tablet by mouth 2 (two) times daily. 60 tablet 11   traMADol (ULTRAM) 50 MG tablet Take one tablet by mouth two times daily for pain 60 tablet 2   No current facility-administered medications for this visit.    Allergies as of 04/08/2023   (No Known Allergies)    Family History  Problem Relation Age of  Onset   Heart failure Mother 73       CVA (secondary to afib    Hyperlipidemia Father    Hypertension Father    Cancer Maternal Grandfather        colon    Diabetes Maternal Grandmother     Social History   Socioeconomic History   Marital status: Married    Spouse name: Not on file   Number of children: 2   Years of education: Not on file   Highest education level: Not on file  Occupational History   Not on file  Tobacco Use   Smoking status: Never   Smokeless tobacco: Never  Vaping Use   Vaping status: Never Used  Substance and Sexual Activity   Alcohol use: Not Currently   Drug use: Never   Sexual activity: Not on file  Other Topics Concern   Not on file  Social History Narrative   Not on file   Social Determinants of Health   Financial Resource Strain: Not on file  Food Insecurity: No Food Insecurity (06/06/2022)   Hunger Vital Sign    Worried About Running Out of Food in the Last Year: Never true    Ran Out of Food in the Last Year: Never true  Transportation Needs: No Transportation Needs (06/06/2022)   PRAPARE - Administrator, Civil Service (Medical): No    Lack of Transportation (Non-Medical): No  Physical Activity: Not on file  Stress: Not on file  Social Connections: Not on file    Review of systems General: negative for malaise, night sweats, fever, chills, weight loss Neck: Negative for lumps, goiter, pain and significant neck swelling Resp: Negative for cough, wheezing, dyspnea at rest  CV: Negative for chest pain, leg swelling, palpitations, orthopnea GI: denies melena, hematochezia, nausea, vomiting, diarrhea, constipation, dysphagia, odyonophagia, early satiety or unintentional weight loss.  MSK: Negative for joint pain or swelling, back pain, and muscle pain. Derm: Negative for itching or rash Psych: Denies depression, anxiety, memory loss, confusion. No homicidal or suicidal ideation.  Heme: Negative for prolonged bleeding, bruising  easily, and swollen nodes. Endocrine: Negative for cold or heat intolerance, polyuria, polydipsia and goiter. Neuro: negative for tremor, gait imbalance, syncope and seizures. The remainder of the review of systems is noncontributory.  Physical Exam: BP 107/69 (BP Location: Right Arm, Patient Position: Sitting, Cuff Size: Large)   Pulse 72   Temp 97.7 F (36.5 C) (Oral)   Ht 5\' 8"  (1.727 m)   Wt 219 lb 14.4 oz (99.7 kg)   BMI 33.44 kg/m  General:   Alert and oriented. No distress noted. Pleasant and cooperative.  Head:  Normocephalic and atraumatic. Eyes:  Conjuctiva clear without scleral icterus. Mouth:  Oral mucosa pink and moist. Good dentition. No lesions. Heart: Normal rate and rhythm, s1 and s2 heart sounds present.  Lungs: Clear lung sounds in all lobes. Respirations equal and unlabored. Abdomen:  +BS, soft, non-tender and non-distended. No rebound or guarding. No HSM or masses noted. Derm: No palmar erythema or jaundice Msk:  Symmetrical without gross deformities. Normal posture. Extremities:  Without edema. Neurologic:  Alert and  oriented x4 Psych:  Alert and cooperative. Normal mood and affect.  Invalid input(s): "6 MONTHS"   ASSESSMENT: Kelly Savage is a 67 y.o. female presenting today for follow-up of abnormal colonoscopy.  Patient with history significant forhistory of perforated small bowel with fistula complicated with abscess formation requiring surgical drainage and SB resection.  She underwent colonoscopy in June 2024 with findings of Inflammation was found in the transverse colon, in the ascending colon and at the cecum suggestive of Crohn's disease. This was mild in severity, however pathology showed focal active colitis, thought possibly related to NSAID use (Voltaren).  She is feeling well today with no abdominal pain, rectal bleeding, melena, diarrhea.  She is having 1 BM per day.  Has only used Voltaren maybe 4 times since her colonoscopy in June.  She was  recommended to have repeat colonoscopy for reevaluation of findings above in approximately 6 months, we will get her scheduled for this.  At this time advised her to continue to avoid all NSAIDs.   PLAN:  Repeat Colonoscopy-ASA II  2. Avoid all NSAIDs   All questions were answered, patient verbalized understanding and is in agreement with plan as outlined above.   Follow Up: 4 months   Momen Ham L. Jeanmarie Hubert, MSN, APRN, AGNP-C Adult-Gerontology Nurse Practitioner [facility]  I have reviewed the note and agree with the APP's assessment as described in this progress note  Katrinka Blazing, MD Gastroenterology and Hepatology [facility] Gastroenterology

## 2023-04-08 NOTE — Patient Instructions (Addendum)
We will get you scheduled for repeat Colonoscopy Please avoid NSAIDs (advil, aleve, naproxen, goody powder, ibuprofen, diclofenac, voltaren, BC powder) as these can be very hard on your GI tract, causing inflammation, ulcers and damage to the lining of your GI tract.   Follow up 4 months  It was a pleasure to see you today. I want to create trusting relationships with patients and provide genuine, compassionate, and quality care. I truly value your feedback! please be on the lookout for a survey regarding your visit with me today. I appreciate your input about our visit and your time in completing this!    Gonzalo Waymire L. Jeanmarie Hubert, MSN, APRN, AGNP-C Adult-Gerontology Nurse Practitioner [facility] Gastroenterology at [facility]

## 2023-04-14 ENCOUNTER — Telehealth (INDEPENDENT_AMBULATORY_CARE_PROVIDER_SITE_OTHER): Payer: Self-pay | Admitting: Gastroenterology

## 2023-04-14 NOTE — Telephone Encounter (Signed)
Pt contacted to schedule TCS with Dr.Castaneda. pt will need to look at January work schedule and call me back to schedule.

## 2023-04-19 ENCOUNTER — Other Ambulatory Visit: Payer: Self-pay

## 2023-04-20 ENCOUNTER — Other Ambulatory Visit (HOSPITAL_COMMUNITY): Payer: Self-pay

## 2023-05-03 NOTE — Telephone Encounter (Signed)
ASA 2 follow up colitis

## 2023-05-04 ENCOUNTER — Other Ambulatory Visit: Payer: Self-pay | Admitting: Family Medicine

## 2023-05-04 DIAGNOSIS — M159 Polyosteoarthritis, unspecified: Secondary | ICD-10-CM

## 2023-05-17 ENCOUNTER — Other Ambulatory Visit (HOSPITAL_COMMUNITY): Payer: Self-pay

## 2023-05-18 ENCOUNTER — Other Ambulatory Visit (HOSPITAL_COMMUNITY): Payer: Self-pay

## 2023-05-21 NOTE — Telephone Encounter (Signed)
Pt left voicemail to schedule TCS. Returned call to patient. Pt would like to be scheduled on 07/09/23 at 11:30am. Advised pt I do not have March schedule at this time but I would put a note and schedule her when March schedule is out. Pt verbalized understanding.

## 2023-05-24 ENCOUNTER — Other Ambulatory Visit (HOSPITAL_COMMUNITY): Payer: Self-pay

## 2023-05-25 ENCOUNTER — Other Ambulatory Visit (HOSPITAL_COMMUNITY): Payer: Self-pay

## 2023-05-25 MED ORDER — PEG 3350-KCL-NA BICARB-NACL 420 G PO SOLR
4000.0000 mL | Freq: Once | ORAL | 0 refills | Status: AC
  Filled 2023-05-25: qty 4000, 1d supply, fill #0

## 2023-05-25 NOTE — Addendum Note (Signed)
Addended by: Marlowe Shores on: 05/25/2023 08:28 AM   Modules accepted: Orders

## 2023-05-25 NOTE — Telephone Encounter (Signed)
Pt scheduled for 07/09/23. Prep sent to [facility] pharmacy. Instructions will be mailed once pre op received. Left detailed message on pt voicemail letting her know I have scheduled her on 07/09/23

## 2023-05-26 ENCOUNTER — Other Ambulatory Visit: Payer: Self-pay | Admitting: Family Medicine

## 2023-05-26 DIAGNOSIS — M159 Polyosteoarthritis, unspecified: Secondary | ICD-10-CM

## 2023-05-31 ENCOUNTER — Other Ambulatory Visit (HOSPITAL_COMMUNITY): Payer: Self-pay

## 2023-06-07 ENCOUNTER — Other Ambulatory Visit (HOSPITAL_COMMUNITY): Payer: Self-pay

## 2023-06-07 MED ORDER — INSULIN PEN NEEDLE 32G X 4 MM MISC
1.0000 | Freq: Four times a day (QID) | 5 refills | Status: AC
  Filled 2023-06-07: qty 100, 25d supply, fill #0
  Filled 2023-06-10 (×2): qty 200, 50d supply, fill #0
  Filled 2023-10-13: qty 200, 50d supply, fill #1
  Filled 2024-01-05: qty 200, 50d supply, fill #2
  Filled 2024-03-09: qty 200, 50d supply, fill #3

## 2023-06-08 ENCOUNTER — Other Ambulatory Visit (HOSPITAL_COMMUNITY): Payer: Self-pay

## 2023-06-09 ENCOUNTER — Ambulatory Visit: Payer: 59 | Admitting: Family Medicine

## 2023-06-10 ENCOUNTER — Other Ambulatory Visit (HOSPITAL_COMMUNITY): Payer: Self-pay

## 2023-06-10 DIAGNOSIS — E1165 Type 2 diabetes mellitus with hyperglycemia: Secondary | ICD-10-CM | POA: Diagnosis not present

## 2023-06-14 ENCOUNTER — Other Ambulatory Visit (HOSPITAL_COMMUNITY): Payer: Self-pay

## 2023-06-17 ENCOUNTER — Other Ambulatory Visit (HOSPITAL_COMMUNITY): Payer: Self-pay

## 2023-06-17 DIAGNOSIS — I10 Essential (primary) hypertension: Secondary | ICD-10-CM | POA: Diagnosis not present

## 2023-06-17 DIAGNOSIS — E1165 Type 2 diabetes mellitus with hyperglycemia: Secondary | ICD-10-CM | POA: Diagnosis not present

## 2023-06-17 DIAGNOSIS — E669 Obesity, unspecified: Secondary | ICD-10-CM | POA: Diagnosis not present

## 2023-06-17 DIAGNOSIS — E7800 Pure hypercholesterolemia, unspecified: Secondary | ICD-10-CM | POA: Diagnosis not present

## 2023-06-17 MED ORDER — DEXCOM G7 RECEIVER DEVI
0 refills | Status: AC
Start: 2023-06-17 — End: ?
  Filled 2023-06-17: qty 1, 90d supply, fill #0

## 2023-06-17 MED ORDER — FREESTYLE LITE TEST VI STRP
ORAL_STRIP | Freq: Three times a day (TID) | 11 refills | Status: DC
Start: 2023-06-17 — End: 2023-12-20
  Filled 2023-06-17: qty 100, 30d supply, fill #0

## 2023-06-17 MED ORDER — DEXCOM G7 SENSOR MISC
5 refills | Status: DC
Start: 2023-06-17 — End: 2023-12-21
  Filled 2023-06-17: qty 3, 30d supply, fill #0
  Filled 2023-07-13: qty 3, 30d supply, fill #1
  Filled 2023-08-14: qty 3, 30d supply, fill #2
  Filled 2023-09-17: qty 3, 30d supply, fill #3
  Filled 2023-10-18 – 2023-10-19 (×2): qty 3, 30d supply, fill #4
  Filled 2023-11-22: qty 3, 30d supply, fill #5

## 2023-06-30 ENCOUNTER — Encounter: Payer: Self-pay | Admitting: Family Medicine

## 2023-06-30 DIAGNOSIS — I10 Essential (primary) hypertension: Secondary | ICD-10-CM

## 2023-06-30 DIAGNOSIS — E785 Hyperlipidemia, unspecified: Secondary | ICD-10-CM

## 2023-07-01 NOTE — Telephone Encounter (Signed)
 Patient had full panel labs 01/05/23 during CPE.   Please advise, thank you!

## 2023-07-05 DIAGNOSIS — I10 Essential (primary) hypertension: Secondary | ICD-10-CM | POA: Diagnosis not present

## 2023-07-05 DIAGNOSIS — E785 Hyperlipidemia, unspecified: Secondary | ICD-10-CM | POA: Diagnosis not present

## 2023-07-06 ENCOUNTER — Other Ambulatory Visit (HOSPITAL_COMMUNITY): Payer: Self-pay

## 2023-07-06 ENCOUNTER — Encounter (HOSPITAL_COMMUNITY)
Admission: RE | Admit: 2023-07-06 | Discharge: 2023-07-06 | Disposition: A | Discharge status: Home / Self Care | Payer: Commercial Managed Care - PPO | Source: Ambulatory Visit | Attending: Gastroenterology | Admitting: Gastroenterology

## 2023-07-06 LAB — CMP14+EGFR
ALT: 11 IU/L (ref 0–32)
AST: 17 IU/L (ref 0–40)
Albumin: 4.3 g/dL (ref 3.9–4.9)
Alkaline Phosphatase: 70 IU/L (ref 44–121)
BUN/Creatinine Ratio: 19 (ref 12–28)
BUN: 15 mg/dL (ref 8–27)
Bilirubin Total: 0.6 mg/dL (ref 0.0–1.2)
CO2: 22 mmol/L (ref 20–29)
Calcium: 9.6 mg/dL (ref 8.7–10.3)
Chloride: 103 mmol/L (ref 96–106)
Creatinine, Ser: 0.77 mg/dL (ref 0.57–1.00)
Globulin, Total: 2.3 g/dL (ref 1.5–4.5)
Glucose: 116 mg/dL — ABNORMAL HIGH (ref 70–99)
Potassium: 4.7 mmol/L (ref 3.5–5.2)
Sodium: 139 mmol/L (ref 134–144)
Total Protein: 6.6 g/dL (ref 6.0–8.5)
eGFR: 84 mL/min/{1.73_m2} (ref >59)

## 2023-07-06 LAB — LIPID PANEL W/O CHOL/HDL RATIO
Cholesterol, Total: 160 mg/dL (ref 100–199)
HDL: 64 mg/dL (ref >39)
LDL Chol Calc (NIH): 78 mg/dL (ref 0–99)
Triglycerides: 99 mg/dL (ref 0–149)
VLDL Cholesterol Cal: 18 mg/dL (ref 5–40)

## 2023-07-08 ENCOUNTER — Ambulatory Visit (INDEPENDENT_AMBULATORY_CARE_PROVIDER_SITE_OTHER): Payer: 59 | Admitting: Family Medicine

## 2023-07-08 ENCOUNTER — Encounter: Payer: Self-pay | Admitting: Family Medicine

## 2023-07-08 ENCOUNTER — Other Ambulatory Visit (HOSPITAL_COMMUNITY): Payer: Self-pay

## 2023-07-08 VITALS — BP 109/70 | HR 83 | Ht 68.0 in | Wt 228.2 lb

## 2023-07-08 DIAGNOSIS — Z1211 Encounter for screening for malignant neoplasm of colon: Secondary | ICD-10-CM

## 2023-07-08 DIAGNOSIS — Z9189 Other specified personal risk factors, not elsewhere classified: Secondary | ICD-10-CM

## 2023-07-08 DIAGNOSIS — E1159 Type 2 diabetes mellitus with other circulatory complications: Secondary | ICD-10-CM

## 2023-07-08 DIAGNOSIS — M5410 Radiculopathy, site unspecified: Secondary | ICD-10-CM

## 2023-07-08 DIAGNOSIS — M791 Myalgia, unspecified site: Secondary | ICD-10-CM | POA: Diagnosis not present

## 2023-07-08 DIAGNOSIS — E785 Hyperlipidemia, unspecified: Secondary | ICD-10-CM

## 2023-07-08 DIAGNOSIS — G8929 Other chronic pain: Secondary | ICD-10-CM

## 2023-07-08 DIAGNOSIS — Z0289 Encounter for other administrative examinations: Secondary | ICD-10-CM | POA: Insufficient documentation

## 2023-07-08 MED ORDER — OXYCODONE HCL 5 MG PO TABS
5.0000 mg | ORAL_TABLET | Freq: Two times a day (BID) | ORAL | 0 refills | Status: AC | PRN
  Filled 2023-07-08: qty 32, 16d supply, fill #0

## 2023-07-08 NOTE — Patient Instructions (Addendum)
 Follow-up in 12 weeks, call if you need me sooner.  Continue to hold Crestor for an additional 4 weeks I am checking a muscle enzyme labs today and will let you know if you will need a repeat blood test.  For your diabetes control check your blood sugar before meals and at bedtime as well as fasting and give yourself the sliding scale coverage based on your Premeal blood sugars.  New for pain management is oxycodone one tablet  twice daily scheduled 4 days/week., so 32 tablets pr month  Please sign the pain contract today as we discussed.  You need also urine drug screen test done today.    Mammogram is due please schedule this as discussed.  Recent test showed normal kidney and liver function which is excellent.  All the best with your colonoscopy and improved health.

## 2023-07-08 NOTE — Assessment & Plan Note (Signed)
 Attributes this to statin stopped x 2weeks

## 2023-07-08 NOTE — Progress Notes (Signed)
 Kelly Savage     MRN: [id number]      DOB: Sep 25, 1955  Chief Complaint  Patient presents with   Medical Management of Chronic Issues    Scheduled for colonoscopy tomorrow. Wants to discuss pain medication for chronic pain.    HPI Kelly Savage is here for follow up and re-evaluation of chronic medical conditions, medication management and review of any available recent lab and radiology data.  Preventive health is updated, specifically  Cancer screening and Immunization.   Questions or concerns regarding consultations or procedures which the PT has had in the interim are  addressed. Has repeat colonoscopy in am Challenged currently with  uncontrolled blood  sugar, on long acting and sliding scale insulin, has found her dexcom to be a great new way to keep up with her sugar The PT denies any adverse reactions to current medications since the last visit.  C/o uncontrolled low back pain, limited in medication she can take that is effective had oxycodone in the  past, feels that this was the most beneficial, would  neeed 2 tabs per day on the 4 days that she works Has upcoming appt with spine surgeon Colonoscopy in am    ROS Denies recent fever or chills. Denies sinus pressure, nasal congestion, ear pain or sore throat. Denies chest congestion, productive cough or wheezing. Denies chest pains, palpitations and leg swelling Denies abdominal pain, nausea, vomiting,diarrhea or constipation.   Denies dysuria, frequency, hesitancy or incontinence.  Denies depression, anxiety or insomnia. Denies skin break down or rash.   PE  BP 109/70   Pulse 83   Ht 5\' 8"  (1.727 m)   Wt 228 lb 4 oz (103.5 kg)   SpO2 97%   BMI 34.71 kg/m   Patient alert and oriented and in no cardiopulmonary distress.  HEENT: No facial asymmetry, EOMI,     Neck supple .  Chest: Clear to auscultation bilaterally.  CVS: S1, S2 no murmurs, no S3.Regular rate.  ABD: Soft non tender.   Ext: No edema  MS:  decreased  ROM spine,  hips and knees.  Skin: Intact, no ulcerations or rash noted.  Psych: Good eye contact, normal affect. Memory intact not anxious or depressed appearing.  CNS: CN 2-12 intact, power,  normal throughout.no focal deficits noted.   Assessment & Plan  Muscle pain Attributes this to statin stopped x 2weeks  Encounter for chronic pain management The patient's Controlled Substance registry is reviewed . Adequacy of  Pain control and level of function is assessed. Medication dosing is adjusted as deemed appropriate. Twelve weeks of medication is prescribed , with a follow up appointment between 11 to 12 weeks .   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   The need to take medication as prescribed, test blood sugar as directed, and to call between visits if there is a concern that blood sugar is uncontrolled is also discussed.  Uncontreolled , managed by Endo , re educated as to when to test blood sugar and goal blood sugar ranges Kelly Savage is reminded  of the importance of daily foot exam, annual eye examination, and good blood sugar, blood pressure and cholesterol control.     Latest Ref Rng & Units 07/05/2023    8:24 AM 01/07/2023    2:44 PM 01/05/2023    8:29 AM 09/11/2022    8:26 AM 06/09/2022    4:27 AM  Diabetic Labs  Micro/Creat Ratio 0 - 29 mg/g creat  16      Chol 100 - 199 mg/dL 295   284     HDL >13 mg/dL 64   67     Calc LDL 0 - 99 mg/dL 78   41     Triglycerides 0 - 149 mg/dL 99   97     Creatinine 0.57 -  1.00 mg/dL 2.44   0.10  2.72  5.36       07/09/2023   12:08 PM 07/09/2023   10:00 AM 07/08/2023    8:56 AM 04/08/2023    9:10 AM 01/07/2023    1:29 PM 10/29/2022   12:36 PM 09/03/2022   11:12 AM  BP/Weight  Systolic BP 119 129 109 107 115 127 98  Diastolic BP 62 68 70 69 73 83 61  Wt. (Lbs)  228.18 228.25 219.9 216.08  204.4  BMI  34.69 kg/m2 34.71 kg/m2 33.44 kg/m2 32.85 kg/m2  31.08 kg/m2      Latest Ref Rng & Units 11/20/2022   12:00 AM 12/19/2021   10:00 AM  Foot/eye exam completion dates  Eye Exam No Retinopathy No Retinopathy       Foot Form Completion   Done     This result is from an external source.        Hyperlipidemia with target LDL less than 100 Hyperlipidemia:Low fat diet discussed and encouraged.   Lipid Panel  Lab Results  Component Value Date   CHOL 160 07/05/2023   HDL 64 07/05/2023   LDLCALC 78 07/05/2023   TRIG 99 07/05/2023   CHOLHDL 1.9 01/05/2023     At goal, however currently off crestor due to muscle pain,  hope is that she resume even low dose due to increased risk of CVD  At increased risk for cardiovascular disease Referred for coronary calcium score if abn cardiology will manage  Back pain with left-sided radiculopathy Uncontrolled and disabling pain especially on the days that she works, on avg 4 days per week, has upcoming appt with Writer. Needs relief from chronic disabling pain

## 2023-07-09 ENCOUNTER — Ambulatory Visit (HOSPITAL_COMMUNITY): Admitting: Anesthesiology

## 2023-07-09 ENCOUNTER — Encounter (HOSPITAL_COMMUNITY): Payer: Self-pay | Admitting: Gastroenterology

## 2023-07-09 ENCOUNTER — Encounter: Payer: Self-pay | Admitting: Family Medicine

## 2023-07-09 ENCOUNTER — Encounter (HOSPITAL_COMMUNITY)
Admission: RE | Disposition: A | Discharge status: Home / Self Care | Payer: Self-pay | Source: Home / Self Care | Attending: Gastroenterology

## 2023-07-09 ENCOUNTER — Other Ambulatory Visit: Payer: Self-pay

## 2023-07-09 ENCOUNTER — Ambulatory Visit (HOSPITAL_COMMUNITY)
Admission: RE | Admit: 2023-07-09 | Discharge: 2023-07-09 | Disposition: A | Discharge status: Home / Self Care | Payer: Commercial Managed Care - PPO | Attending: Gastroenterology | Admitting: Gastroenterology

## 2023-07-09 DIAGNOSIS — K635 Polyp of colon: Secondary | ICD-10-CM

## 2023-07-09 DIAGNOSIS — Z794 Long term (current) use of insulin: Secondary | ICD-10-CM | POA: Diagnosis not present

## 2023-07-09 DIAGNOSIS — Z8249 Family history of ischemic heart disease and other diseases of the circulatory system: Secondary | ICD-10-CM | POA: Insufficient documentation

## 2023-07-09 DIAGNOSIS — K6389 Other specified diseases of intestine: Secondary | ICD-10-CM | POA: Diagnosis not present

## 2023-07-09 DIAGNOSIS — E119 Type 2 diabetes mellitus without complications: Secondary | ICD-10-CM | POA: Insufficient documentation

## 2023-07-09 DIAGNOSIS — D123 Benign neoplasm of transverse colon: Secondary | ICD-10-CM | POA: Diagnosis not present

## 2023-07-09 DIAGNOSIS — Z833 Family history of diabetes mellitus: Secondary | ICD-10-CM | POA: Diagnosis not present

## 2023-07-09 DIAGNOSIS — K529 Noninfective gastroenteritis and colitis, unspecified: Secondary | ICD-10-CM

## 2023-07-09 DIAGNOSIS — K5730 Diverticulosis of large intestine without perforation or abscess without bleeding: Secondary | ICD-10-CM | POA: Diagnosis not present

## 2023-07-09 DIAGNOSIS — Z09 Encounter for follow-up examination after completed treatment for conditions other than malignant neoplasm: Secondary | ICD-10-CM | POA: Diagnosis not present

## 2023-07-09 DIAGNOSIS — I10 Essential (primary) hypertension: Secondary | ICD-10-CM | POA: Insufficient documentation

## 2023-07-09 DIAGNOSIS — K633 Ulcer of intestine: Secondary | ICD-10-CM | POA: Insufficient documentation

## 2023-07-09 DIAGNOSIS — Z800 Family history of malignant neoplasm of digestive organs: Secondary | ICD-10-CM | POA: Insufficient documentation

## 2023-07-09 DIAGNOSIS — Z7984 Long term (current) use of oral hypoglycemic drugs: Secondary | ICD-10-CM | POA: Diagnosis not present

## 2023-07-09 HISTORY — PX: POLYPECTOMY: SHX5525

## 2023-07-09 HISTORY — PX: COLONOSCOPY WITH PROPOFOL: SHX5780

## 2023-07-09 LAB — GLUCOSE, CAPILLARY
Glucose-Capillary: 77 mg/dL (ref 70–99)
Glucose-Capillary: 74 mg/dL (ref 70–99)

## 2023-07-09 LAB — CK: Total CK: 78 U/L (ref 32–182)

## 2023-07-09 LAB — HM COLONOSCOPY

## 2023-07-09 SURGERY — COLONOSCOPY WITH PROPOFOL
Anesthesia: General

## 2023-07-09 MED ORDER — PHENYLEPHRINE 80 MCG/ML (10ML) SYRINGE FOR IV PUSH (FOR BLOOD PRESSURE SUPPORT)
PREFILLED_SYRINGE | INTRAVENOUS | Status: DC | PRN
  Administered 2023-07-09: 80 ug via INTRAVENOUS
  Administered 2023-07-09 (×3): 160 ug via INTRAVENOUS

## 2023-07-09 MED ORDER — PROPOFOL 500 MG/50ML IV EMUL
INTRAVENOUS | Status: DC | PRN
  Administered 2023-07-09 (×2): 150 ug/kg/min via INTRAVENOUS

## 2023-07-09 MED ORDER — PROPOFOL 10 MG/ML IV BOLUS
INTRAVENOUS | Status: DC | PRN
  Administered 2023-07-09 (×3): 50 mg via INTRAVENOUS

## 2023-07-09 MED ORDER — LACTATED RINGERS IV SOLN: INTRAVENOUS | Status: DC | PRN

## 2023-07-09 MED ORDER — LIDOCAINE HCL (PF) 2 % IJ SOLN
INTRAMUSCULAR | Status: DC | PRN
  Administered 2023-07-09: 50 mg via INTRADERMAL

## 2023-07-09 NOTE — Discharge Instructions (Signed)
 You are being discharged to home.  Resume your previous diet.  We are waiting for your pathology results.  Your physician has recommended a repeat colonoscopy for surveillance based on pathology results.  Do not take any high dose aspirin, ibuprofen (including Advil, Motrin or Nuprin), naproxen (including Aleve), or any other non-steroidal anti-inflammatory drugs.

## 2023-07-09 NOTE — H&P (Signed)
 Kelly Savage is an 68 y.o. female.   Chief Complaint: inflammation of colon. HPI: Kelly Savage is a 68 y.o. 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  Patient had presence of inflammation last colonoscopy performed in June 2024.  However, at that time the patient was taking ibuprofen regularly.  Biopsies were taken but they were only showing presence of active colitis without chronic nodular changes.  Decision was made to repeat colonoscopy while being off NSAIDs.  Has only taken a 5 doses of Voltaren since her last colonoscopy. The patient denies having any nausea, vomiting, fever, chills, hematochezia, melena, hematemesis, abdominal distention, abdominal pain, diarrhea, jaundice, pruritus or weight loss.   Past Medical History:  Diagnosis Date   Ankle pain x3 months   bilateral   Diabetes mellitus type II 2006   Diabetes mellitus without complication (HCC)    [employer] 07/11/2020   Obesity all her life    lost 28 lbs in 14 weeks    Primary localized osteoarthritis of knee    Left   Primary localized osteoarthritis of left knee 09/23/2017   Right thigh pain    Thigh pain x 1 year    bilateral, esp when laying down, worse in the morning , and goes away with activity    Past Surgical History:  Procedure Laterality Date   APPENDECTOMY  2000   BIOPSY  10/29/2022   Procedure: BIOPSY;  Surgeon: Dolores Frame, MD;  Location: [facility];  Service: Gastroenterology;;   BOWEL RESECTION N/A 09/26/2021   Procedure: PARTIAL SMALL BOWEL RESECTION;  Surgeon: Franky Macho, MD;  Location: [facility];  Service: General;  Laterality: N/A;   BOWEL RESECTION  06/05/2022   Procedure: SMALL BOWEL RESECTION;  Surgeon: Lucretia Roers, MD;  Location: [facility];  Service: General;;   Cholecysectomy  1998   CHOLECYSTECTOMY      COLONOSCOPY WITH PROPOFOL N/A 10/29/2022   Procedure: COLONOSCOPY WITH PROPOFOL;  Surgeon: Dolores Frame, MD;  Location: [facility];  Service: Gastroenterology;  Laterality: N/A;  10:30AM; ASA 3   INCISIONAL HERNIA REPAIR N/A 09/26/2021   Procedure: HERNIORRHAPHY INCISIONAL;  Surgeon: Franky Macho, MD;  Location: [facility];  Service: General;  Laterality: N/A;   LAPAROTOMY N/A 06/05/2022   Procedure: EXPLORATORY LAPAROTOMY WITH OMENECTOMY;  Surgeon: Lucretia Roers, MD;  Location: [facility];  Service: General;  Laterality: N/A;   TONSILLECTOMY     in childhood   TOTAL KNEE ARTHROPLASTY Left 10/18/2017   TOTAL KNEE ARTHROPLASTY Left 10/18/2017   Procedure: TOTAL KNEE ARTHROPLASTY;  Surgeon: Salvatore Marvel, MD;  Location: [facility];  Service: Orthopedics;  Laterality: Left;    Family History  Problem Relation Age of Onset   Heart failure Mother 42       CVA (secondary to afib    Hyperlipidemia Father    Hypertension Father    Cancer Maternal Grandfather        colon    Diabetes Maternal Grandmother    Social History:  reports that she has never smoked. She has never used smokeless tobacco. She reports that she does not currently use alcohol. She reports that she does not use drugs.  Allergies: No Known Allergies  Medications Prior to Admission  Medication Sig Dispense Refill   benazepril (LOTENSIN) 5 MG tablet Take 1 tablet (5 mg total) by  mouth daily. 90 tablet 2   calcium carbonate (OSCAL) 1500 (600 Ca) MG TABS tablet Take 1,200 mg by mouth every evening.     cholecalciferol (VITAMIN D3) 25 MCG (1000 UNIT) tablet Take 1,000 Units by mouth daily. 2000 IU daily     gabapentin (NEURONTIN) 100 MG capsule Take 100 mg by mouth 2 (two) times daily.     insulin degludec (TRESIBA FLEXTOUCH) 100 UNIT/ML FlexTouch Pen Inject 25 Units into the skin once daily as directed. Titrate up to 50 units daily max as directed 15 mL 5   insulin lispro (HUMALOG) 100 UNIT/ML KwikPen Inject 3-4 Units  into the skin 3 (three) times daily before meals. 9 mL 5   oxyCODONE (ROXICODONE) 5 MG immediate release tablet Take 1 tablet (5 mg total) by mouth 2 (two) times daily as needed for back pain. 32 tablet 0   Blood Glucose Monitoring Suppl (FREESTYLE LITE) w/Device KIT Use to check blood glucose 3 (three) times daily. 1 kit 0   Continuous Glucose Receiver (DEXCOM G7 RECEIVER) DEVI Use as directed 1 each 0   Continuous Glucose Sensor (DEXCOM G7 SENSOR) MISC Apply sensor every 10 days. 3 each 5   empagliflozin (JARDIANCE) 10 MG TABS tablet Take 1 tablet (10 mg total) by mouth daily before breakfast. 90 tablet 2   glucose blood (FREESTYLE LITE) test strip Use as instructed to test blood sugar once daily. 100 each 11   glucose blood (FREESTYLE LITE) test strip Use as directed three times daily. 100 strip 11   Insulin Pen Needle 32G X 4 MM MISC use as directed once daily 100 each 5   Insulin Pen Needle 32G X 4 MM MISC Use to administer injectable medication 4 (four) times daily. 200 each 5   Lancets (FREESTYLE) lancets Use as instructed to test blood sugar once daily. 100 each 2   SitaGLIPtin-MetFORMIN HCl (JANUMET XR) 50-1000 MG TB24 Take 1 tablet by mouth 2 (two) times daily. 60 tablet 11   traMADol (ULTRAM) 50 MG tablet Take one tablet by mouth two times daily for pain (Patient not taking: Reported on 07/08/2023) 60 tablet 2    Results for orders placed or performed during the hospital encounter of 07/09/23 (from the past 48 hours)  Glucose, capillary     Status: None   Collection Time: 07/09/23  9:56 AM  Result Value Ref Range   Glucose-Capillary 77 70 - 99 mg/dL    Comment: Glucose reference range applies only to samples taken after fasting for at least 8 hours.   No results found.  Review of Systems  All other systems reviewed and are negative.   Blood pressure 129/68, pulse 69, temperature 98 F (36.7 C), temperature source Oral, resp. rate 17, height 5\' 8"  (1.727 m), weight 103.5 kg, SpO2  100%. Physical Exam  GENERAL: The patient is AO x3, in no acute distress. HEENT: Head is normocephalic and atraumatic. EOMI are intact. Mouth is well hydrated and without lesions. NECK: Supple. No masses LUNGS: Clear to auscultation. No presence of rhonchi/wheezing/rales. Adequate chest expansion HEART: RRR, normal s1 and s2. ABDOMEN: Soft, nontender, no guarding, no peritoneal signs, and nondistended. BS +. No masses. EXTREMITIES: Without any cyanosis, clubbing, rash, lesions or edema. NEUROLOGIC: AOx3, no focal motor deficit. SKIN: no jaundice, no rashes  Assessment/Plan RIA REDCAY is a 68 y.o. female with past medical history of DM and history of perforated small bowel with fistula complicated with abscess formation requiring surgical drainage and SB resection, coming  for evaluation of inflammation of colon. Qwill proceed with colonoscopy  Dolores Frame, MD 07/09/2023, 10:32 AM

## 2023-07-09 NOTE — Op Note (Signed)
 [facility] Patient Name: Kelly Savage Procedure Date: 07/09/2023 11:07 AM MRN: [id number] Date of Birth: 02-02-1956 Attending MD: Katrinka Blazing , , [id number] CSN: [id number] Age: 68 Admit Type: Outpatient Procedure:                Colonoscopy Indications:              Follow-up of colitis Providers:                Katrinka Blazing, Francoise Ceo RN, RN, Dyann Ruddle Referring MD:              Medicines:                Monitored Anesthesia Care Complications:            No immediate complications. Estimated Blood Loss:     Estimated blood loss: none. Procedure:                Pre-Anesthesia Assessment:                           - Prior to the procedure, a History and Physical                            was performed, and patient medications, allergies                            and sensitivities were reviewed. The patient's                            tolerance of previous anesthesia was reviewed.                           - The risks and benefits of the procedure and the                            sedation options and risks were discussed with the                            patient. All questions were answered and informed                            consent was obtained.                           - ASA Grade Assessment: II - A patient with mild                            systemic disease.                           After obtaining informed consent, the colonoscope                            was passed under direct vision. Throughout the                            procedure, the patient's blood pressure,  pulse, and                            oxygen saturations were monitored continuously. The                            [id number]-[id number] ([id number]) scope was introduced through                            the anus and advanced to the the terminal ileum.                            The colonoscopy was performed without difficulty.                            The patient tolerated the procedure  well. The                            quality of the bowel preparation was good. Scope In: 11:24:15 AM Scope Out: 12:00:12 PM Scope Withdrawal Time: 0 hours 25 minutes 36 seconds  Total Procedure Duration: 0 hours 35 minutes 57 seconds  Findings:      The perianal and digital rectal examinations were normal.      The terminal ileum appeared normal. Biopsies were taken with a cold       forceps for histology.      A few localized non-bleeding erosions were found in the transverse colon       and in the ascending colon. Biopsies were taken with a cold forceps for       histology.      Two sessile polyps were found in the transverse colon. The polyps were 2       mm in size. These polyps were removed with a cold snare. Resection and       retrieval were complete.      Scattered small-mouthed diverticula were found in the sigmoid colon and       descending colon.      The retroflexed view of the distal rectum and anal verge was normal and       showed no anal or rectal abnormalities. Impression:               - The examined portion of the ileum was normal.                            Biopsied.                           - A few erosions in the transverse colon and in the                            ascending colon. Biopsied.                           - Two 2 mm polyps in the transverse colon, removed                            with a cold snare.  Resected and retrieved.                           - Diverticulosis in the sigmoid colon and in the                            descending colon.                           - The distal rectum and anal verge are normal on                            retroflexion view. Moderate Sedation:      Per Anesthesia Care Recommendation:           - Discharge patient to home (ambulatory).                           - Resume previous diet.                           - Await pathology results.                           - Repeat colonoscopy for surveillance based on                             pathology results.                           - No high dose aspirin, ibuprofen, naproxen, or                            other non-steroidal anti-inflammatory drugs. Procedure Code(s):        --- Professional ---                           [phone], Colonoscopy, flexible; with removal of                            tumor(s), polyp(s), or other lesion(s) by snare                            technique                           45380, 59, Colonoscopy, flexible; with biopsy,                            single or multiple Diagnosis Code(s):        --- Professional ---                           K63.3, Ulcer of intestine                           D12.3, Benign neoplasm of transverse colon (hepatic  flexure or splenic flexure)                           K52.9, Noninfective gastroenteritis and colitis,                            unspecified                           K57.30, Diverticulosis of large intestine without                            perforation or abscess without bleeding CPT copyright 2022 American Medical Association. All rights reserved. The codes documented in this report are preliminary and upon coder review may  be revised to meet current compliance requirements. Katrinka Blazing, MD Katrinka Blazing,  07/09/2023 12:08:34 PM This report has been signed electronically. Number of Addenda: 0

## 2023-07-09 NOTE — Transfer of Care (Signed)
 Immediate Anesthesia Transfer of Care Note  Patient: Kelly Savage  Procedure(s) Performed: COLONOSCOPY WITH PROPOFOL POLYPECTOMY  Patient Location: Short Stay  Anesthesia Type:General  Level of Consciousness: awake, alert , and oriented  Airway & Oxygen Therapy: Patient Spontanous Breathing  Post-op Assessment: Report given to RN and Post -op Vital signs reviewed and stable  Post vital signs: Reviewed and stable  Last Vitals:  Vitals Value Taken Time  BP    Temp    Pulse    Resp    SpO2      Last Pain:  Vitals:   07/09/23 1119  TempSrc:   PainSc: 1          Complications: No notable events documented.

## 2023-07-09 NOTE — Progress Notes (Signed)
 Pt's blood glucose 74. Denies any s/s hypoglycemia. Ginger ale given per patient request.

## 2023-07-09 NOTE — Anesthesia Preprocedure Evaluation (Signed)
 Anesthesia Evaluation  Patient identified by MRN, date of birth, ID band Patient awake    Reviewed: Allergy & Precautions, H&P , NPO status , Patient's Chart, lab work & pertinent test results, reviewed documented beta blocker date and time   Airway Mallampati: II  TM Distance: >3 FB Neck ROM: full    Dental no notable dental hx.    Pulmonary neg pulmonary ROS   Pulmonary exam normal breath sounds clear to auscultation       Cardiovascular Exercise Tolerance: Good hypertension,  Rhythm:regular Rate:Normal     Neuro/Psych  Neuromuscular disease  negative psych ROS   GI/Hepatic negative GI ROS, Neg liver ROS,,,  Endo/Other  diabetes    Renal/GU negative Renal ROS  negative genitourinary   Musculoskeletal   Abdominal   Peds  Hematology negative hematology ROS (+)   Anesthesia Other Findings   Reproductive/Obstetrics negative OB ROS                             Anesthesia Physical Anesthesia Plan  ASA: 2  Anesthesia Plan: General   Post-op Pain Management:    Induction:   PONV Risk Score and Plan: Propofol infusion  Airway Management Planned:   Additional Equipment:   Intra-op Plan:   Post-operative Plan:   Informed Consent: I have reviewed the patients History and Physical, chart, labs and discussed the procedure including the risks, benefits and alternatives for the proposed anesthesia with the patient or authorized representative who has indicated his/her understanding and acceptance.     Dental Advisory Given  Plan Discussed with: CRNA  Anesthesia Plan Comments:        Anesthesia Quick Evaluation

## 2023-07-12 ENCOUNTER — Encounter (INDEPENDENT_AMBULATORY_CARE_PROVIDER_SITE_OTHER): Payer: Self-pay | Admitting: *Deleted

## 2023-07-12 ENCOUNTER — Encounter (HOSPITAL_COMMUNITY): Payer: Self-pay | Admitting: Gastroenterology

## 2023-07-12 LAB — SURGICAL PATHOLOGY

## 2023-07-12 NOTE — Anesthesia Postprocedure Evaluation (Signed)
 Anesthesia Post Note  Patient: Kelly Savage  Procedure(s) Performed: COLONOSCOPY WITH PROPOFOL POLYPECTOMY  Patient location during evaluation: Phase II Anesthesia Type: General Level of consciousness: awake Pain management: pain level controlled Vital Signs Assessment: post-procedure vital signs reviewed and stable Respiratory status: spontaneous breathing and respiratory function stable Cardiovascular status: blood pressure returned to baseline and stable Postop Assessment: no headache and no apparent nausea or vomiting Anesthetic complications: no Comments: Late entry   No notable events documented.   Last Vitals:  Vitals:   07/09/23 1000 07/09/23 1208  BP: 129/68 119/62  Pulse: 69 69  Resp: 17 (!) 21  Temp: 36.7 C (!) 36.4 C  SpO2: 100% 96%    Last Pain:  Vitals:   07/12/23 1320  TempSrc:   PainSc: 0-No pain                 Windell Norfolk

## 2023-07-13 ENCOUNTER — Other Ambulatory Visit (HOSPITAL_COMMUNITY): Payer: Self-pay

## 2023-07-13 ENCOUNTER — Other Ambulatory Visit: Payer: Self-pay | Admitting: Family Medicine

## 2023-07-15 ENCOUNTER — Encounter (INDEPENDENT_AMBULATORY_CARE_PROVIDER_SITE_OTHER): Payer: Self-pay | Admitting: *Deleted

## 2023-07-22 DIAGNOSIS — E7800 Pure hypercholesterolemia, unspecified: Secondary | ICD-10-CM | POA: Diagnosis not present

## 2023-07-22 DIAGNOSIS — E1165 Type 2 diabetes mellitus with hyperglycemia: Secondary | ICD-10-CM | POA: Diagnosis not present

## 2023-07-22 DIAGNOSIS — E669 Obesity, unspecified: Secondary | ICD-10-CM | POA: Diagnosis not present

## 2023-07-22 DIAGNOSIS — I10 Essential (primary) hypertension: Secondary | ICD-10-CM | POA: Diagnosis not present

## 2023-07-26 ENCOUNTER — Other Ambulatory Visit (HOSPITAL_COMMUNITY): Payer: Self-pay

## 2023-07-26 ENCOUNTER — Other Ambulatory Visit: Payer: Self-pay | Admitting: Family Medicine

## 2023-07-28 ENCOUNTER — Other Ambulatory Visit (HOSPITAL_COMMUNITY): Payer: Self-pay

## 2023-07-28 ENCOUNTER — Other Ambulatory Visit: Payer: Self-pay | Admitting: Family Medicine

## 2023-07-28 MED ORDER — BENAZEPRIL HCL 5 MG PO TABS
5.0000 mg | ORAL_TABLET | Freq: Every day | ORAL | 2 refills | Status: DC
  Filled 2023-07-28: qty 90, 90d supply, fill #0
  Filled 2023-11-04: qty 90, 90d supply, fill #1
  Filled 2024-02-08: qty 90, 90d supply, fill #2

## 2023-07-30 ENCOUNTER — Other Ambulatory Visit: Payer: Self-pay | Admitting: Family Medicine

## 2023-07-30 DIAGNOSIS — Z1231 Encounter for screening mammogram for malignant neoplasm of breast: Secondary | ICD-10-CM

## 2023-08-01 DIAGNOSIS — E785 Hyperlipidemia, unspecified: Secondary | ICD-10-CM | POA: Insufficient documentation

## 2023-08-01 DIAGNOSIS — Z9189 Other specified personal risk factors, not elsewhere classified: Secondary | ICD-10-CM | POA: Insufficient documentation

## 2023-08-01 MED ORDER — OXYCODONE HCL 5 MG PO TABS
5.0000 mg | ORAL_TABLET | Freq: Two times a day (BID) | ORAL | 0 refills | Status: AC | PRN
  Filled 2023-09-15 – 2023-09-16 (×2): qty 32, 28d supply, fill #0

## 2023-08-01 MED ORDER — OXYCODONE HCL 5 MG PO TABS: 5.0000 mg | ORAL_TABLET | Freq: Two times a day (BID) | ORAL | 0 refills | Status: DC | PRN

## 2023-08-01 MED ORDER — OXYCODONE HCL 5 MG PO TABS
5.0000 mg | ORAL_TABLET | Freq: Two times a day (BID) | ORAL | 0 refills | Status: AC | PRN
  Filled 2023-08-05: qty 32, 28d supply, fill #0

## 2023-08-01 NOTE — Assessment & Plan Note (Signed)
 Referred for coronary calcium score if abn cardiology will manage

## 2023-08-01 NOTE — Assessment & Plan Note (Signed)
 Diabetes associated with hypertension, hyperlipidemia, and obesity  Kelly Savage is reminded of the importance of commitment to daily physical activity for 30 minutes or more, as able and the need to limit carbohydrate intake to 30 to 60 grams per meal to help with blood sugar control.   The need to take medication as prescribed, test blood sugar as directed, and to call between visits if there is a concern that blood sugar is uncontrolled is also discussed.  Uncontreolled , managed by Endo , re educated as to when to test blood sugar and goal blood sugar ranges Kelly Savage is reminded of the importance of daily foot exam, annual eye examination, and good blood sugar, blood pressure and cholesterol control.     Latest Ref Rng & Units 07/05/2023    8:24 AM 01/07/2023    2:44 PM 01/05/2023    8:29 AM 09/11/2022    8:26 AM 06/09/2022    4:27 AM  Diabetic Labs  Micro/Creat Ratio 0 - 29 mg/g creat  16      Chol 100 - 199 mg/dL 161   096     HDL >04 mg/dL 64   67     Calc LDL 0 - 99 mg/dL 78   41     Triglycerides 0 - 149 mg/dL 99   97     Creatinine 0.57 - 1.00 mg/dL 5.40   9.81  1.91  4.78       07/09/2023   12:08 PM 07/09/2023   10:00 AM 07/08/2023    8:56 AM 04/08/2023    9:10 AM 01/07/2023    1:29 PM 10/29/2022   12:36 PM 09/03/2022   11:12 AM  BP/Weight  Systolic BP 119 129 109 107 115 127 98  Diastolic BP 62 68 70 69 73 83 61  Wt. (Lbs)  228.18 228.25 219.9 216.08  204.4  BMI  34.69 kg/m2 34.71 kg/m2 33.44 kg/m2 32.85 kg/m2  31.08 kg/m2      Latest Ref Rng & Units 11/20/2022   12:00 AM 12/19/2021   10:00 AM  Foot/eye exam completion dates  Eye Exam No Retinopathy No Retinopathy       Foot Form Completion   Done     This result is from an external source.

## 2023-08-01 NOTE — Assessment & Plan Note (Signed)
 Overdose risk assessed and mental health assessed, Very low overdose risk Pain contract reviewed and signed UDS to be collected

## 2023-08-01 NOTE — Assessment & Plan Note (Signed)
 The patient's Controlled Substance registry is reviewed . Adequacy of  Pain control and level of function is assessed. Medication dosing is adjusted as deemed appropriate. Twelve weeks of medication is prescribed , with a follow up appointment between 11 to 12 weeks .

## 2023-08-01 NOTE — Assessment & Plan Note (Signed)
 Uncontrolled and disabling pain especially on the days that she works, on avg 4 days per week, has upcoming appt with Writer. Needs relief from chronic disabling pain

## 2023-08-01 NOTE — Assessment & Plan Note (Signed)
 Hyperlipidemia:Low fat diet discussed and encouraged.   Lipid Panel  Lab Results  Component Value Date   CHOL 160 07/05/2023   HDL 64 07/05/2023   LDLCALC 78 07/05/2023   TRIG 99 07/05/2023   CHOLHDL 1.9 01/05/2023     At goal, however currently off crestor due to muscle pain,  hope is that she resume even low dose due to increased risk of CVD

## 2023-08-02 ENCOUNTER — Other Ambulatory Visit (HOSPITAL_COMMUNITY): Payer: Self-pay

## 2023-08-03 ENCOUNTER — Other Ambulatory Visit (HOSPITAL_COMMUNITY): Payer: Self-pay

## 2023-08-05 ENCOUNTER — Other Ambulatory Visit (HOSPITAL_COMMUNITY): Payer: Self-pay

## 2023-08-05 ENCOUNTER — Other Ambulatory Visit: Payer: Self-pay

## 2023-08-05 MED ORDER — JANUMET XR 50-1000 MG PO TB24
1.0000 | ORAL_TABLET | Freq: Two times a day (BID) | ORAL | 11 refills | Status: DC
  Filled 2023-08-05: qty 60, 30d supply, fill #0
  Filled 2023-09-06: qty 60, 30d supply, fill #1
  Filled 2023-10-06: qty 60, 30d supply, fill #2
  Filled 2023-11-04: qty 60, 30d supply, fill #3
  Filled 2023-12-01: qty 60, 30d supply, fill #4
  Filled 2024-01-05: qty 60, 30d supply, fill #5
  Filled 2024-02-08: qty 60, 30d supply, fill #6
  Filled 2024-03-09: qty 60, 30d supply, fill #7
  Filled 2024-04-05: qty 60, 30d supply, fill #8
  Filled 2024-05-05 (×2): qty 60, 30d supply, fill #9

## 2023-08-06 ENCOUNTER — Other Ambulatory Visit (HOSPITAL_COMMUNITY): Payer: Self-pay

## 2023-08-09 ENCOUNTER — Other Ambulatory Visit (HOSPITAL_COMMUNITY): Payer: Self-pay

## 2023-08-09 ENCOUNTER — Ambulatory Visit (INDEPENDENT_AMBULATORY_CARE_PROVIDER_SITE_OTHER): Payer: 59 | Admitting: Gastroenterology

## 2023-08-10 ENCOUNTER — Other Ambulatory Visit (HOSPITAL_COMMUNITY): Payer: Self-pay

## 2023-08-14 ENCOUNTER — Other Ambulatory Visit (HOSPITAL_COMMUNITY): Payer: Self-pay

## 2023-08-20 ENCOUNTER — Ambulatory Visit
Admission: RE | Admit: 2023-08-20 | Discharge: 2023-08-20 | Disposition: A | Discharge status: Home / Self Care | Source: Ambulatory Visit | Attending: Family Medicine | Admitting: Family Medicine

## 2023-08-20 DIAGNOSIS — Z1231 Encounter for screening mammogram for malignant neoplasm of breast: Secondary | ICD-10-CM

## 2023-08-26 ENCOUNTER — Other Ambulatory Visit (HOSPITAL_COMMUNITY): Payer: Self-pay

## 2023-08-26 MED ORDER — AMOXICILLIN 500 MG PO CAPS
2000.0000 mg | ORAL_CAPSULE | ORAL | 0 refills | Status: AC
Start: 2023-08-26 — End: 2023-08-30
  Filled 2023-08-26: qty 12, 3d supply, fill #0

## 2023-09-07 ENCOUNTER — Other Ambulatory Visit: Payer: Self-pay

## 2023-09-07 ENCOUNTER — Other Ambulatory Visit (HOSPITAL_COMMUNITY): Payer: Self-pay

## 2023-09-09 ENCOUNTER — Other Ambulatory Visit: Payer: Self-pay | Admitting: Family Medicine

## 2023-09-15 ENCOUNTER — Other Ambulatory Visit (HOSPITAL_COMMUNITY): Payer: Self-pay

## 2023-09-16 ENCOUNTER — Other Ambulatory Visit: Payer: Self-pay

## 2023-09-16 ENCOUNTER — Other Ambulatory Visit (HOSPITAL_COMMUNITY): Payer: Self-pay

## 2023-09-16 ENCOUNTER — Ambulatory Visit (HOSPITAL_BASED_OUTPATIENT_CLINIC_OR_DEPARTMENT_OTHER)
Admission: RE | Admit: 2023-09-16 | Discharge: 2023-09-16 | Disposition: A | Discharge status: Home / Self Care | Payer: Self-pay | Source: Ambulatory Visit | Attending: Family Medicine | Admitting: Family Medicine

## 2023-09-16 DIAGNOSIS — Z9189 Other specified personal risk factors, not elsewhere classified: Secondary | ICD-10-CM | POA: Insufficient documentation

## 2023-09-17 ENCOUNTER — Other Ambulatory Visit (HOSPITAL_COMMUNITY): Payer: Self-pay

## 2023-09-30 ENCOUNTER — Ambulatory Visit: Admitting: Family Medicine

## 2023-10-05 ENCOUNTER — Other Ambulatory Visit (HOSPITAL_COMMUNITY): Payer: Self-pay

## 2023-10-05 ENCOUNTER — Encounter: Payer: Self-pay | Admitting: Family Medicine

## 2023-10-05 ENCOUNTER — Ambulatory Visit: Admitting: Family Medicine

## 2023-10-05 VITALS — BP 111/69 | HR 75 | Resp 16 | Ht 68.0 in | Wt 224.1 lb

## 2023-10-05 DIAGNOSIS — E1159 Type 2 diabetes mellitus with other circulatory complications: Secondary | ICD-10-CM

## 2023-10-05 DIAGNOSIS — E66811 Obesity, class 1: Secondary | ICD-10-CM | POA: Diagnosis not present

## 2023-10-05 DIAGNOSIS — M5410 Radiculopathy, site unspecified: Secondary | ICD-10-CM

## 2023-10-05 DIAGNOSIS — I10 Essential (primary) hypertension: Secondary | ICD-10-CM

## 2023-10-05 MED ORDER — OXYCODONE HCL 5 MG PO TABS
5.0000 mg | ORAL_TABLET | Freq: Two times a day (BID) | ORAL | 0 refills | Status: AC | PRN
  Filled 2023-10-14: qty 32, 28d supply, fill #0

## 2023-10-05 MED ORDER — GABAPENTIN 100 MG PO CAPS
ORAL_CAPSULE | ORAL | 3 refills | Status: DC
  Filled 2023-10-05: qty 150, 30d supply, fill #0
  Filled 2023-11-04: qty 150, 30d supply, fill #1
  Filled 2023-12-01: qty 150, 30d supply, fill #2

## 2023-10-05 MED ORDER — OXYCODONE HCL 5 MG PO TABS
5.0000 mg | ORAL_TABLET | Freq: Two times a day (BID) | ORAL | 0 refills | Status: DC | PRN
  Filled 2023-12-10: qty 32, 28d supply, fill #0

## 2023-10-05 MED ORDER — OXYCODONE HCL 5 MG PO TABS
5.0000 mg | ORAL_TABLET | Freq: Two times a day (BID) | ORAL | 0 refills | Status: DC | PRN
  Filled 2023-11-12: qty 32, 28d supply, fill #0

## 2023-10-05 NOTE — Assessment & Plan Note (Signed)
 DASH diet and commitment to daily physical activity for a minimum of 30 minutes discussed and encouraged, as a part of hypertension management. The importance of attaining a healthy weight is also discussed.     10/05/2023    9:06 AM 07/09/2023   12:08 PM 07/09/2023   10:00 AM 07/08/2023    8:56 AM 04/08/2023    9:10 AM 01/07/2023    1:29 PM 10/29/2022   12:36 PM  BP/Weight  Systolic BP 111 119 129 109 107 115 127  Diastolic BP 69 62 68 70 69 73 83  Wt. (Lbs) 224.12  228.18 228.25 219.9 216.08   BMI 34.08 kg/m2  34.69 kg/m2 34.71 kg/m2 33.44 kg/m2 32.85 kg/m2      Controlled, no change in medication

## 2023-10-05 NOTE — Assessment & Plan Note (Signed)
  Patient re-educated about  the importance of commitment to a  minimum of 150 minutes of exercise per week as able.  The importance of healthy food choices with portion control discussed, as well as eating regularly and within a 12 hour window most days. The need to choose "clean , green" food 50 to 75% of the time is discussed, as well as to make water the primary drink and set a goal of 64 ounces water daily.       10/05/2023    9:06 AM 07/09/2023   10:00 AM 07/08/2023    8:56 AM  Weight /BMI  Weight 224 lb 1.9 oz 228 lb 2.8 oz 228 lb 4 oz  Height 5\' 8"  (1.727 m) 5\' 8"  (1.727 m) 5\' 8"  (1.727 m)  BMI 34.08 kg/m2 34.69 kg/m2 34.71 kg/m2

## 2023-10-05 NOTE — Patient Instructions (Addendum)
 Annual exam in office mid August, call ikf you need me sooner  New for nerve pain is gabapentin  100 mg capsule  For the first 4 days take 1 capsule 3 times daily.  After 4 days if you still have significant pain take 1 capsule twice daily and 2 capsules at bedtime.  After an additional 4 days if the pain is still uncontrolled take 1 capsule twice daily and 3 at bedtime.  Send messages with any concerns.  Oxycodone  will continue as before.  I strongly recommend physical therapy for 4 to 6 weeks to help with chronic back and lower extremity pain.  Best for the Summer  Thanks for choosing [facility], we consider it a privelige to serve you.

## 2023-10-05 NOTE — Assessment & Plan Note (Signed)
 Uncontrolled, start gabapentin  10mmfg 3 times daily, max dose of 5 per day Contine oxycodone  as before PT encouraged

## 2023-10-05 NOTE — Progress Notes (Signed)
 Kelly Savage     MRN: [id number]      DOB: [birth year]  Chief Complaint  Patient presents with   Diabetes    12 week follow up     HPI Kelly Savage is here for follow up and re-evaluation of chronic medical conditions, medication management and review of any available recent lab and radiology data.  Preventive health is updated, specifically  Cancer screening and Immunization.   Questions or concerns regarding consultations or procedures which the PT has had in the interim are  addressed.using continuos glucose monitor and has started back on long acting insulin  and u is doing better The PT denies any adverse reactions to current medications since the last visit.  Back and RLE pain and discomfort continue to be a problem, will resume gabapention which she has used in the past, has appt with neurosurg coming up, hesitant re PT both time and cost. Right leg [pain disturbs sleep at night Has been re started on long acting insulin  with improved blood sugar ROS Denies recent fever or chills. Denies sinus pressure, nasal congestion, ear pain or sore throat. Denies chest congestion, productive cough or wheezing. Denies chest pains, palpitations and leg swelling Denies abdominal pain, nausea, vomiting,diarrhea or constipation.   Denies dysuria, frequency, hesitancy or incontinence.  Denies depression, anxiety . Denies skin break down or rash.   PE  BP 111/69   Pulse 75   Resp 16   Ht 5\' 8"  (1.727 m)   Wt 224 lb 1.9 oz (101.7 kg)   SpO2 96%   BMI 34.08 kg/m   Patient alert and oriented and in no cardiopulmonary distress.  HEENT: No facial asymmetry, EOMI,     Neck supple .  Chest: Clear to auscultation bilaterally.  CVS: S1, S2 no murmurs, no S3.Regular rate.  ABD: Soft non tender.   Ext: No edema  MS: decreased  ROM spine, shoulders, hips and knees.  Skin: Intact, no ulcerations or rash noted.  Psych: Good eye contact, normal affect. Memory intact not anxious or depressed  appearing.  CNS: CN 2-12 intact, power,  normal throughout.no focal deficits noted.   Assessment & Plan  Essential hypertension DASH diet and commitment to daily physical activity for a minimum of 30 minutes discussed and encouraged, as a part of hypertension management. The importance of attaining a healthy weight is also discussed.     10/05/2023    9:06 AM 07/09/2023   12:08 PM 07/09/2023   10:00 AM 07/08/2023    8:56 AM 04/08/2023    9:10 AM 01/07/2023    1:29 PM 10/29/2022   12:36 PM  BP/Weight  Systolic BP 111 119 129 109 107 115 127  Diastolic BP 69 62 68 70 69 73 83  Wt. (Lbs) 224.12  228.18 228.25 219.9 216.08   BMI 34.08 kg/m2  34.69 kg/m2 34.71 kg/m2 33.44 kg/m2 32.85 kg/m2      Controlled, no change in medication   Back pain with left-sided radiculopathy Uncontrolled, start gabapentin  10mmfg 3 times daily, max dose of 5 per day Contine oxycodone  as before PT encouraged  Type 2 diabetes mellitus with vascular disease (HCC) Diabetes associated with hypertension, hyperlipidemia, obesity, and arthritis  Kelly Savage is reminded of the importance of commitment to daily physical activity for 30 minutes or more, as able and the need to limit carbohydrate intake to 30 to 60 grams per meal to help with blood sugar control.   The need to take medication as prescribed, test  blood sugar as directed, and to call between visits if there is a concern that blood sugar is uncontrolled is also discussed.   Kelly Savage is reminded of the importance of daily foot exam, annual eye examination, and good blood sugar, blood pressure and cholesterol control.     Latest Ref Rng & Units 07/05/2023    8:24 AM 01/07/2023    2:44 PM 01/05/2023    8:29 AM 09/11/2022    8:26 AM 06/09/2022    4:27 AM  Diabetic Labs  Micro/Creat Ratio 0 - 29 mg/g creat  16      Chol 100 - 199 mg/dL 474   259     HDL >56 mg/dL 64   67     Calc LDL 0 - 99 mg/dL 78   41     Triglycerides 0 - 149 mg/dL 99   97     Creatinine  0.57 - 1.00 mg/dL 3.87   5.64  3.32  9.51       10/05/2023    9:06 AM 07/09/2023   12:08 PM 07/09/2023   10:00 AM 07/08/2023    8:56 AM 04/08/2023    9:10 AM 01/07/2023    1:29 PM 10/29/2022   12:36 PM  BP/Weight  Systolic BP 111 119 129 109 107 115 127  Diastolic BP 69 62 68 70 69 73 83  Wt. (Lbs) 224.12  228.18 228.25 219.9 216.08   BMI 34.08 kg/m2  34.69 kg/m2 34.71 kg/m2 33.44 kg/m2 32.85 kg/m2       Latest Ref Rng & Units 11/20/2022   12:00 AM 12/19/2021   10:00 AM  Foot/eye exam completion dates  Eye Exam No Retinopathy No Retinopathy       Foot Form Completion   Done     This result is from an external source.      Uncontrolled , managed by Endo  Obesity (BMI 30.0-34.9)  Patient re-educated about  the importance of commitment to a  minimum of 150 minutes of exercise per week as able.  The importance of healthy food choices with portion control discussed, as well as eating regularly and within a 12 hour window most days. The need to choose "clean , green" food 50 to 75% of the time is discussed, as well as to make water the primary drink and set a goal of 64 ounces water daily.       10/05/2023    9:06 AM 07/09/2023   10:00 AM 07/08/2023    8:56 AM  Weight /BMI  Weight 224 lb 1.9 oz 228 lb 2.8 oz 228 lb 4 oz  Height 5\' 8"  (1.727 m) 5\' 8"  (1.727 m) 5\' 8"  (1.727 m)  BMI 34.08 kg/m2 34.69 kg/m2 34.71 kg/m2

## 2023-10-05 NOTE — Assessment & Plan Note (Signed)
 Diabetes associated with hypertension, hyperlipidemia, obesity, and arthritis  Kelly Savage is reminded of the importance of commitment to daily physical activity for 30 minutes or more, as able and the need to limit carbohydrate intake to 30 to 60 grams per meal to help with blood sugar control.   The need to take medication as prescribed, test blood sugar as directed, and to call between visits if there is a concern that blood sugar is uncontrolled is also discussed.   Kelly Savage is reminded of the importance of daily foot exam, annual eye examination, and good blood sugar, blood pressure and cholesterol control.     Latest Ref Rng & Units 07/05/2023    8:24 AM 01/07/2023    2:44 PM 01/05/2023    8:29 AM 09/11/2022    8:26 AM 06/09/2022    4:27 AM  Diabetic Labs  Micro/Creat Ratio 0 - 29 mg/g creat  16      Chol 100 - 199 mg/dL 829   562     HDL >13 mg/dL 64   67     Calc LDL 0 - 99 mg/dL 78   41     Triglycerides 0 - 149 mg/dL 99   97     Creatinine 0.57 - 1.00 mg/dL 0.86   5.78  4.69  6.29       10/05/2023    9:06 AM 07/09/2023   12:08 PM 07/09/2023   10:00 AM 07/08/2023    8:56 AM 04/08/2023    9:10 AM 01/07/2023    1:29 PM 10/29/2022   12:36 PM  BP/Weight  Systolic BP 111 119 129 109 107 115 127  Diastolic BP 69 62 68 70 69 73 83  Wt. (Lbs) 224.12  228.18 228.25 219.9 216.08   BMI 34.08 kg/m2  34.69 kg/m2 34.71 kg/m2 33.44 kg/m2 32.85 kg/m2       Latest Ref Rng & Units 11/20/2022   12:00 AM 12/19/2021   10:00 AM  Foot/eye exam completion dates  Eye Exam No Retinopathy No Retinopathy       Foot Form Completion   Done     This result is from an external source.      Uncontrolled , managed by Endo

## 2023-10-07 ENCOUNTER — Other Ambulatory Visit (HOSPITAL_COMMUNITY): Payer: Self-pay

## 2023-10-11 ENCOUNTER — Other Ambulatory Visit (HOSPITAL_COMMUNITY): Payer: Self-pay

## 2023-10-14 ENCOUNTER — Other Ambulatory Visit: Payer: Self-pay

## 2023-10-14 ENCOUNTER — Other Ambulatory Visit (HOSPITAL_COMMUNITY): Payer: Self-pay

## 2023-10-19 ENCOUNTER — Other Ambulatory Visit (HOSPITAL_COMMUNITY): Payer: Self-pay

## 2023-10-21 ENCOUNTER — Telehealth: Admitting: Physician Assistant

## 2023-10-21 ENCOUNTER — Other Ambulatory Visit (HOSPITAL_COMMUNITY): Payer: Self-pay

## 2023-10-21 DIAGNOSIS — R3989 Other symptoms and signs involving the genitourinary system: Secondary | ICD-10-CM

## 2023-10-21 MED ORDER — CEPHALEXIN 500 MG PO CAPS
500.0000 mg | ORAL_CAPSULE | Freq: Two times a day (BID) | ORAL | 0 refills | Status: AC
  Filled 2023-10-21: qty 14, 7d supply, fill #0

## 2023-10-21 NOTE — Progress Notes (Signed)

## 2023-10-21 NOTE — Progress Notes (Signed)
 I have spent 5 minutes in review of e-visit questionnaire, review and updating patient chart, medical decision making and response to patient.   Piedad Climes, PA-C

## 2023-10-28 ENCOUNTER — Other Ambulatory Visit (HOSPITAL_COMMUNITY): Payer: Self-pay

## 2023-10-28 ENCOUNTER — Other Ambulatory Visit: Payer: Self-pay | Admitting: Family Medicine

## 2023-10-28 ENCOUNTER — Other Ambulatory Visit: Payer: Self-pay

## 2023-10-28 DIAGNOSIS — E1165 Type 2 diabetes mellitus with hyperglycemia: Secondary | ICD-10-CM | POA: Diagnosis not present

## 2023-10-28 MED ORDER — TRESIBA FLEXTOUCH 100 UNIT/ML ~~LOC~~ SOPN
25.0000 [IU] | PEN_INJECTOR | Freq: Every day | SUBCUTANEOUS | 5 refills | Status: AC
  Filled 2023-10-28: qty 15, 30d supply, fill #0
  Filled 2023-12-23: qty 15, 30d supply, fill #1
  Filled 2024-02-08: qty 15, 30d supply, fill #2
  Filled 2024-04-05: qty 15, 30d supply, fill #3
  Filled 2024-06-01: qty 30, 60d supply, fill #4
  Filled 2024-06-01: qty 15, 30d supply, fill #4

## 2023-10-28 MED ORDER — EMPAGLIFLOZIN 10 MG PO TABS
10.0000 mg | ORAL_TABLET | Freq: Every day | ORAL | 2 refills | Status: AC
  Filled 2023-10-28: qty 90, 90d supply, fill #0
  Filled 2024-02-08: qty 90, 90d supply, fill #1
  Filled 2024-05-05 (×2): qty 90, 90d supply, fill #2
  Filled 2024-05-09: qty 90, 90d supply, fill #0

## 2023-11-06 ENCOUNTER — Other Ambulatory Visit (HOSPITAL_COMMUNITY): Payer: Self-pay

## 2023-11-10 ENCOUNTER — Other Ambulatory Visit (HOSPITAL_COMMUNITY): Payer: Self-pay

## 2023-11-10 DIAGNOSIS — E7800 Pure hypercholesterolemia, unspecified: Secondary | ICD-10-CM | POA: Diagnosis not present

## 2023-11-10 DIAGNOSIS — M419 Scoliosis, unspecified: Secondary | ICD-10-CM | POA: Diagnosis not present

## 2023-11-10 DIAGNOSIS — M5416 Radiculopathy, lumbar region: Secondary | ICD-10-CM | POA: Diagnosis not present

## 2023-11-10 DIAGNOSIS — Z6833 Body mass index (BMI) 33.0-33.9, adult: Secondary | ICD-10-CM | POA: Diagnosis not present

## 2023-11-10 DIAGNOSIS — E1165 Type 2 diabetes mellitus with hyperglycemia: Secondary | ICD-10-CM | POA: Diagnosis not present

## 2023-11-10 DIAGNOSIS — E669 Obesity, unspecified: Secondary | ICD-10-CM | POA: Diagnosis not present

## 2023-11-10 DIAGNOSIS — I10 Essential (primary) hypertension: Secondary | ICD-10-CM | POA: Diagnosis not present

## 2023-11-12 ENCOUNTER — Other Ambulatory Visit (HOSPITAL_COMMUNITY): Payer: Self-pay

## 2023-11-12 ENCOUNTER — Other Ambulatory Visit: Payer: Self-pay

## 2023-11-22 ENCOUNTER — Other Ambulatory Visit (HOSPITAL_COMMUNITY): Payer: Self-pay

## 2023-12-02 ENCOUNTER — Other Ambulatory Visit (HOSPITAL_COMMUNITY): Payer: Self-pay

## 2023-12-10 ENCOUNTER — Other Ambulatory Visit (HOSPITAL_COMMUNITY): Payer: Self-pay

## 2023-12-16 ENCOUNTER — Encounter: Payer: Self-pay | Admitting: Family Medicine

## 2023-12-16 ENCOUNTER — Ambulatory Visit (INDEPENDENT_AMBULATORY_CARE_PROVIDER_SITE_OTHER): Admitting: Family Medicine

## 2023-12-16 ENCOUNTER — Other Ambulatory Visit (HOSPITAL_COMMUNITY): Payer: Self-pay

## 2023-12-16 VITALS — BP 110/70 | HR 72 | Resp 12 | Ht 68.0 in | Wt 224.0 lb

## 2023-12-16 DIAGNOSIS — E785 Hyperlipidemia, unspecified: Secondary | ICD-10-CM

## 2023-12-16 DIAGNOSIS — I10 Essential (primary) hypertension: Secondary | ICD-10-CM

## 2023-12-16 DIAGNOSIS — E1159 Type 2 diabetes mellitus with other circulatory complications: Secondary | ICD-10-CM | POA: Diagnosis not present

## 2023-12-16 DIAGNOSIS — E559 Vitamin D deficiency, unspecified: Secondary | ICD-10-CM

## 2023-12-16 DIAGNOSIS — G8929 Other chronic pain: Secondary | ICD-10-CM

## 2023-12-16 DIAGNOSIS — E66811 Obesity, class 1: Secondary | ICD-10-CM

## 2023-12-16 DIAGNOSIS — Z0001 Encounter for general adult medical examination with abnormal findings: Secondary | ICD-10-CM | POA: Diagnosis not present

## 2023-12-16 MED ORDER — PHENTERMINE HCL 15 MG PO CAPS
15.0000 mg | ORAL_CAPSULE | ORAL | 0 refills | Status: AC
  Filled 2023-12-16: qty 90, 90d supply, fill #0

## 2023-12-16 MED ORDER — GABAPENTIN 300 MG PO CAPS
300.0000 mg | ORAL_CAPSULE | Freq: Two times a day (BID) | ORAL | 3 refills | Status: AC
  Filled 2023-12-16: qty 180, 90d supply, fill #0
  Filled 2024-03-24: qty 180, 90d supply, fill #1

## 2023-12-16 NOTE — Patient Instructions (Addendum)
 F/U in 11 to 12 weeks  New phentermne 15 mg one daily for help with weight loss  New higher dose of gabapentin   300 mg twice daily  Labs today lipid, cmp and EGFr TSH, CBC and vit D  PLEASE sched your eye exam  Sept 5 orshortly after pls submit Urine to nearest lab, urine AcR  .Best for rest of Summer into Fall!  Thanks for choosing [facility], we consider it a privelige to serve you.

## 2023-12-17 ENCOUNTER — Ambulatory Visit: Payer: Self-pay | Admitting: Family Medicine

## 2023-12-17 LAB — CBC WITH DIFFERENTIAL/PLATELET
Basophils Absolute: 0 x10E3/uL (ref 0.0–0.2)
Basos: 1 %
EOS (ABSOLUTE): 0.1 x10E3/uL (ref 0.0–0.4)
Eos: 2 %
Hematocrit: 44.5 % (ref 34.0–46.6)
Hemoglobin: 13.9 g/dL (ref 11.1–15.9)
Immature Grans (Abs): 0 x10E3/uL (ref 0.0–0.1)
Immature Granulocytes: 0 %
Lymphocytes Absolute: 1.1 x10E3/uL (ref 0.7–3.1)
Lymphs: 19 %
MCH: 28.2 pg (ref 26.6–33.0)
MCHC: 31.2 g/dL — ABNORMAL LOW (ref 31.5–35.7)
MCV: 90 fL (ref 79–97)
Monocytes Absolute: 0.4 x10E3/uL (ref 0.1–0.9)
Monocytes: 7 %
Neutrophils Absolute: 4.1 x10E3/uL (ref 1.4–7.0)
Neutrophils: 71 %
Platelets: 246 x10E3/uL (ref 150–450)
RBC: 4.93 x10E6/uL (ref 3.77–5.28)
RDW: 13.9 % (ref 11.7–15.4)
WBC: 5.8 x10E3/uL (ref 3.4–10.8)

## 2023-12-17 LAB — CMP14+EGFR
ALT: 18 IU/L (ref 0–32)
AST: 23 IU/L (ref 0–40)
Albumin: 4.4 g/dL (ref 3.9–4.9)
Alkaline Phosphatase: 94 IU/L (ref 44–121)
BUN/Creatinine Ratio: 19 (ref 12–28)
BUN: 16 mg/dL (ref 8–27)
Bilirubin Total: 1.3 mg/dL — ABNORMAL HIGH (ref 0.0–1.2)
CO2: 23 mmol/L (ref 20–29)
Calcium: 9.9 mg/dL (ref 8.7–10.3)
Chloride: 98 mmol/L (ref 96–106)
Creatinine, Ser: 0.86 mg/dL (ref 0.57–1.00)
Globulin, Total: 2.2 g/dL (ref 1.5–4.5)
Glucose: 115 mg/dL — ABNORMAL HIGH (ref 70–99)
Potassium: 4.6 mmol/L (ref 3.5–5.2)
Sodium: 137 mmol/L (ref 134–144)
Total Protein: 6.6 g/dL (ref 6.0–8.5)
eGFR: 74 mL/min/1.73

## 2023-12-17 LAB — LIPID PANEL
Chol/HDL Ratio: 2 ratio (ref 0.0–4.4)
Cholesterol, Total: 134 mg/dL (ref 100–199)
HDL: 67 mg/dL (ref >39)
LDL Chol Calc (NIH): 51 mg/dL (ref 0–99)
Triglycerides: 81 mg/dL (ref 0–149)
VLDL Cholesterol Cal: 16 mg/dL (ref 5–40)

## 2023-12-17 LAB — TSH: TSH: 2.53 u[IU]/mL (ref 0.450–4.500)

## 2023-12-17 LAB — VITAMIN D 25 HYDROXY (VIT D DEFICIENCY, FRACTURES): Vit D, 25-Hydroxy: 40.9 ng/mL (ref 30.0–100.0)

## 2023-12-20 ENCOUNTER — Encounter: Payer: Self-pay | Admitting: Family Medicine

## 2023-12-20 ENCOUNTER — Other Ambulatory Visit (HOSPITAL_COMMUNITY): Payer: Self-pay

## 2023-12-20 DIAGNOSIS — Z0001 Encounter for general adult medical examination with abnormal findings: Secondary | ICD-10-CM | POA: Insufficient documentation

## 2023-12-20 MED ORDER — OXYCODONE HCL 5 MG PO TABS
5.0000 mg | ORAL_TABLET | Freq: Two times a day (BID) | ORAL | 0 refills | Status: AC | PRN
  Filled 2024-02-08: qty 32, 28d supply, fill #0

## 2023-12-20 MED ORDER — OXYCODONE HCL 5 MG PO TABS
5.0000 mg | ORAL_TABLET | Freq: Two times a day (BID) | ORAL | 0 refills | Status: AC | PRN
  Filled 2024-03-10: qty 32, 28d supply, fill #0

## 2023-12-20 MED ORDER — OXYCODONE HCL 5 MG PO TABS
5.0000 mg | ORAL_TABLET | Freq: Two times a day (BID) | ORAL | 0 refills | Status: AC | PRN
  Filled 2024-01-10: qty 32, 28d supply, fill #0

## 2023-12-20 NOTE — Assessment & Plan Note (Signed)
  Patient re-educated about  the importance of commitment to a  minimum of 150 minutes of exercise per week as able.  The importance of healthy food choices with portion control discussed, as well as eating regularly and within a 12 hour window most days. The need to choose clean , green food 50 to 75% of the time is discussed, as well as to make water the primary drink and set a goal of 64 ounces water daily.       12/16/2023    8:12 AM 10/05/2023    9:06 AM 07/09/2023   10:00 AM  Weight /BMI  Weight 224 lb 224 lb 1.9 oz 228 lb 2.8 oz  Height 5' 8 (1.727 m) 5' 8 (1.727 m) 5' 8 (1.727 m)  BMI 34.06 kg/m2 34.08 kg/m2 34.69 kg/m2    Start phentermine  15 mg daily

## 2023-12-20 NOTE — Progress Notes (Signed)
 Kelly Savage     MRN: [id number]      DOB: March 04, 1956  Chief Complaint  Patient presents with   Annual Exam    cpe    HPI: Patient is in for annual physical exam. Increased back and lower ext pain, modification on dosing of gabapentin  also has epidural in next 1 week, PT recommended but unable to afford Immunization is reviewed , and  updated if needed.   PE: BP 110/70   Pulse 72   Resp 12   Ht 5' 8 (1.727 m)   Wt 224 lb (101.6 kg)   SpO2 96%   BMI 34.06 kg/m    Pleasant  female, alert and oriented x 3, in no cardio-pulmonary distress. Afebrile. HEENT No facial trauma or asymetry. Sinuses non tender.  Extra occullar muscles intact.. External ears normal, . Neck: supple, no adenopathy,JVD or thyromegaly.No bruits.  Chest: Clear to ascultation bilaterally.No crackles or wheezes. Non tender to palpation   Cardiovascular system; Heart sounds normal,  S1 and  S2 ,no S3.  No murmur, or thrill. Apical beat not displaced Peripheral pulses normal.  Abdomen: Soft, non tender, no organomegaly or masses. No bruits. Bowel sounds normal. No guarding, tenderness or rebound.  fD:Izrmzjdzi  ROM of spine,  and knees. No deformity ,swelling or crepitus noted. No muscle wasting or atrophy.   Neurologic: Cranial nerves 2 to 12 intact. Power, tone ,sensation s normal throughout. No disturbance in gait. No tremor.  Skin: Intact, no ulceration, erythema , scaling or rash noted. Pigmentation normal throughout  Psych; Normal mood and affect. Judgement and concentration normal   Assessment & Plan:  Annual visit for general adult medical examination with abnormal findings Annual exam as documented. Counseling done  re healthy lifestyle involving commitment to 150 minutes exercise per week, heart healthy diet, and attaining healthy weight.The importance of adequate sleep also discussed. . Immunization and cancer screening needs are specifically addressed at this  visit.   Type 2 diabetes mellitus with vascular disease (HCC) Diabetes associated with obesity and arthritis  Ms. Rolston is reminded of the importance of commitment to daily physical activity for 30 minutes or more, as able and the need to limit carbohydrate intake to 30 to 60 grams per meal to help with blood sugar control.   The need to take medication as prescribed, test blood sugar as directed, and to call between visits if there is a concern that blood sugar is uncontrolled is also discussed.   Ms. Pester is reminded of the importance of daily foot exam, annual eye examination, and good blood sugar, blood pressure and cholesterol control.     Latest Ref Rng & Units 12/16/2023    8:49 AM 07/05/2023    8:24 AM 01/07/2023    2:44 PM 01/05/2023    8:29 AM 09/11/2022    8:26 AM  Diabetic Labs  Micro/Creat Ratio 0 - 29 mg/g creat   16     Chol 100 - 199 mg/dL 865  839   873    HDL >60 mg/dL 67  64   67    Calc LDL 0 - 99 mg/dL 51  78   41    Triglycerides 0 - 149 mg/dL 81  99   97    Creatinine 0.57 - 1.00 mg/dL 9.13  9.22   9.23  9.26       12/16/2023    8:12 AM 10/05/2023    9:06 AM 07/09/2023   12:08 PM 07/09/2023  10:00 AM 07/08/2023    8:56 AM 04/08/2023    9:10 AM 01/07/2023    1:29 PM  BP/Weight  Systolic BP 110 111 119 129 109 107 115  Diastolic BP 70 69 62 68 70 69 73  Wt. (Lbs) 224 224.12  228.18 228.25 219.9 216.08  BMI 34.06 kg/m2 34.08 kg/m2  34.69 kg/m2 34.71 kg/m2 33.44 kg/m2 32.85 kg/m2      Latest Ref Rng & Units 11/20/2022   12:00 AM 12/19/2021   10:00 AM  Foot/eye exam completion dates  Eye Exam No Retinopathy No Retinopathy       Foot Form Completion   Done     This result is from an external source.        Encounter for chronic pain management The patient's Controlled Substance registry is reviewed and compliance confirmed. Adequacy of  Pain control and level of function is assessed. Medication dosing is adjusted as deemed appropriate. Twelve weeks of  medication is prescribed , with a follow up appointment between 11 to 12 weeks .   Obesity (BMI 30.0-34.9)  Patient re-educated about  the importance of commitment to a  minimum of 150 minutes of exercise per week as able.  The importance of healthy food choices with portion control discussed, as well as eating regularly and within a 12 hour window most days. The need to choose clean , green food 50 to 75% of the time is discussed, as well as to make water the primary drink and set a goal of 64 ounces water daily.       12/16/2023    8:12 AM 10/05/2023    9:06 AM 07/09/2023   10:00 AM  Weight /BMI  Weight 224 lb 224 lb 1.9 oz 228 lb 2.8 oz  Height 5' 8 (1.727 m) 5' 8 (1.727 m) 5' 8 (1.727 m)  BMI 34.06 kg/m2 34.08 kg/m2 34.69 kg/m2    Start phentermine  15 mg daily

## 2023-12-20 NOTE — Assessment & Plan Note (Signed)
 The patient's Controlled Substance registry is reviewed and compliance confirmed. Adequacy of  Pain control and level of function is assessed. Medication dosing is adjusted as deemed appropriate. Twelve weeks of medication is prescribed , with a follow up appointment between 11 to 12 weeks .

## 2023-12-20 NOTE — Assessment & Plan Note (Signed)
 Annual exam as documented. Counseling done  re healthy lifestyle involving commitment to 150 minutes exercise per week, heart healthy diet, and attaining healthy weight.The importance of adequate sleep also discussed.  Immunization and cancer screening needs are specifically addressed at this visit.

## 2023-12-20 NOTE — Assessment & Plan Note (Signed)
 Diabetes associated with obesity and arthritis  Kelly Savage is reminded of the importance of commitment to daily physical activity for 30 minutes or more, as able and the need to limit carbohydrate intake to 30 to 60 grams per meal to help with blood sugar control.   The need to take medication as prescribed, test blood sugar as directed, and to call between visits if there is a concern that blood sugar is uncontrolled is also discussed.   Kelly Savage is reminded of the importance of daily foot exam, annual eye examination, and good blood sugar, blood pressure and cholesterol control.     Latest Ref Rng & Units 12/16/2023    8:49 AM 07/05/2023    8:24 AM 01/07/2023    2:44 PM 01/05/2023    8:29 AM 09/11/2022    8:26 AM  Diabetic Labs  Micro/Creat Ratio 0 - 29 mg/g creat   16     Chol 100 - 199 mg/dL 865  839   873    HDL >60 mg/dL 67  64   67    Calc LDL 0 - 99 mg/dL 51  78   41    Triglycerides 0 - 149 mg/dL 81  99   97    Creatinine 0.57 - 1.00 mg/dL 9.13  9.22   9.23  9.26       12/16/2023    8:12 AM 10/05/2023    9:06 AM 07/09/2023   12:08 PM 07/09/2023   10:00 AM 07/08/2023    8:56 AM 04/08/2023    9:10 AM 01/07/2023    1:29 PM  BP/Weight  Systolic BP 110 111 119 129 109 107 115  Diastolic BP 70 69 62 68 70 69 73  Wt. (Lbs) 224 224.12  228.18 228.25 219.9 216.08  BMI 34.06 kg/m2 34.08 kg/m2  34.69 kg/m2 34.71 kg/m2 33.44 kg/m2 32.85 kg/m2      Latest Ref Rng & Units 11/20/2022   12:00 AM 12/19/2021   10:00 AM  Foot/eye exam completion dates  Eye Exam No Retinopathy No Retinopathy       Foot Form Completion   Done     This result is from an external source.

## 2023-12-21 ENCOUNTER — Other Ambulatory Visit (HOSPITAL_COMMUNITY): Payer: Self-pay

## 2023-12-21 MED ORDER — DEXCOM G7 SENSOR MISC
1 refills | Status: DC
  Filled 2023-12-21: qty 9, 90d supply, fill #0
  Filled 2023-12-22: qty 3, 30d supply, fill #0
  Filled 2024-01-24: qty 3, 30d supply, fill #1
  Filled 2024-02-24: qty 3, 30d supply, fill #2
  Filled 2024-03-24: qty 3, 30d supply, fill #3
  Filled 2024-05-08: qty 3, 30d supply, fill #4
  Filled 2024-06-06: qty 3, 30d supply, fill #5

## 2023-12-22 ENCOUNTER — Other Ambulatory Visit (HOSPITAL_COMMUNITY): Payer: Self-pay

## 2023-12-23 ENCOUNTER — Other Ambulatory Visit (HOSPITAL_COMMUNITY): Payer: Self-pay

## 2023-12-24 ENCOUNTER — Other Ambulatory Visit (HOSPITAL_COMMUNITY): Payer: Self-pay

## 2023-12-28 DIAGNOSIS — M5416 Radiculopathy, lumbar region: Secondary | ICD-10-CM | POA: Diagnosis not present

## 2023-12-28 DIAGNOSIS — M5116 Intervertebral disc disorders with radiculopathy, lumbar region: Secondary | ICD-10-CM | POA: Diagnosis not present

## 2024-01-05 ENCOUNTER — Other Ambulatory Visit (HOSPITAL_COMMUNITY): Payer: Self-pay

## 2024-01-10 ENCOUNTER — Other Ambulatory Visit (HOSPITAL_COMMUNITY): Payer: Self-pay

## 2024-01-13 ENCOUNTER — Encounter: Admitting: Family Medicine

## 2024-01-19 DIAGNOSIS — H52203 Unspecified astigmatism, bilateral: Secondary | ICD-10-CM | POA: Diagnosis not present

## 2024-01-19 DIAGNOSIS — H5203 Hypermetropia, bilateral: Secondary | ICD-10-CM | POA: Diagnosis not present

## 2024-01-19 LAB — HM DIABETES EYE EXAM

## 2024-01-24 ENCOUNTER — Other Ambulatory Visit (HOSPITAL_COMMUNITY): Payer: Self-pay

## 2024-02-08 ENCOUNTER — Other Ambulatory Visit: Payer: Self-pay

## 2024-02-08 ENCOUNTER — Other Ambulatory Visit (HOSPITAL_COMMUNITY): Payer: Self-pay

## 2024-02-16 ENCOUNTER — Encounter (INDEPENDENT_AMBULATORY_CARE_PROVIDER_SITE_OTHER): Payer: Self-pay | Admitting: Gastroenterology

## 2024-02-25 ENCOUNTER — Other Ambulatory Visit (HOSPITAL_COMMUNITY): Payer: Self-pay

## 2024-03-10 ENCOUNTER — Other Ambulatory Visit (HOSPITAL_COMMUNITY): Payer: Self-pay

## 2024-03-24 ENCOUNTER — Other Ambulatory Visit (HOSPITAL_COMMUNITY): Payer: Self-pay

## 2024-04-05 ENCOUNTER — Other Ambulatory Visit (HOSPITAL_COMMUNITY): Payer: Self-pay

## 2024-04-10 ENCOUNTER — Other Ambulatory Visit (HOSPITAL_COMMUNITY): Payer: Self-pay

## 2024-04-10 ENCOUNTER — Other Ambulatory Visit: Payer: Self-pay | Admitting: Family Medicine

## 2024-04-12 ENCOUNTER — Other Ambulatory Visit: Payer: Self-pay | Admitting: Family Medicine

## 2024-04-12 NOTE — Telephone Encounter (Unsigned)
 Copied from CRM #[id number]. Topic: Clinical - Medication Refill >> Apr 12, 2024 10:57 AM Donna BRAVO wrote: Medication:  oxyCODONE  (ROXICODONE ) 5 MG immediate release tablet  Has the patient contacted their pharmacy? Yes Pharmacy stated they will call us    This is the patient's preferred pharmacy:   [facility] [address], [address] [place] [address] Phone: [phone] Fax: [phone]  Is this the correct pharmacy for this prescription? Yes If no, delete pharmacy and type the correct one.   Has the prescription been filled recently? Yes  Is the patient out of the medication? Yes  Has the patient been seen for an appointment in the last year OR does the patient have an upcoming appointment? Yes  Can we respond through MyChart? No  Agent: Please be advised that Rx refills may take up to 3 business days. We ask that you follow-up with your pharmacy.

## 2024-04-13 ENCOUNTER — Encounter: Payer: Self-pay | Admitting: Family Medicine

## 2024-04-13 ENCOUNTER — Ambulatory Visit: Admitting: Family Medicine

## 2024-04-13 ENCOUNTER — Other Ambulatory Visit: Payer: Self-pay | Admitting: Family Medicine

## 2024-04-13 ENCOUNTER — Other Ambulatory Visit (HOSPITAL_COMMUNITY): Payer: Self-pay

## 2024-04-13 MED ORDER — OXYCODONE HCL 5 MG PO TABS
5.0000 mg | ORAL_TABLET | Freq: Two times a day (BID) | ORAL | 0 refills | Status: DC | PRN
  Filled 2024-04-13: qty 32, 28d supply, fill #0

## 2024-04-13 MED ORDER — OXYCODONE HCL 5 MG PO TABS
5.0000 mg | ORAL_TABLET | Freq: Two times a day (BID) | ORAL | 0 refills | Status: AC | PRN
  Filled 2024-05-12: qty 32, 28d supply, fill #0

## 2024-04-14 ENCOUNTER — Other Ambulatory Visit (HOSPITAL_COMMUNITY): Payer: Self-pay

## 2024-05-05 ENCOUNTER — Encounter (HOSPITAL_COMMUNITY): Payer: Self-pay

## 2024-05-05 ENCOUNTER — Other Ambulatory Visit (HOSPITAL_COMMUNITY): Payer: Self-pay

## 2024-05-05 ENCOUNTER — Other Ambulatory Visit: Payer: Self-pay | Admitting: Family Medicine

## 2024-05-05 MED ORDER — BENAZEPRIL HCL 5 MG PO TABS
5.0000 mg | ORAL_TABLET | Freq: Every day | ORAL | 2 refills | Status: AC
  Filled 2024-05-05: qty 90, 90d supply, fill #0

## 2024-05-08 ENCOUNTER — Other Ambulatory Visit (HOSPITAL_COMMUNITY): Payer: Self-pay

## 2024-05-09 ENCOUNTER — Other Ambulatory Visit: Payer: Self-pay

## 2024-05-09 ENCOUNTER — Other Ambulatory Visit (HOSPITAL_COMMUNITY): Payer: Self-pay

## 2024-05-10 ENCOUNTER — Other Ambulatory Visit: Payer: Self-pay

## 2024-05-11 ENCOUNTER — Other Ambulatory Visit: Payer: Self-pay

## 2024-05-12 ENCOUNTER — Other Ambulatory Visit (HOSPITAL_COMMUNITY): Payer: Self-pay

## 2024-05-18 ENCOUNTER — Other Ambulatory Visit (HOSPITAL_COMMUNITY): Payer: Self-pay

## 2024-05-18 MED ORDER — INSULIN LISPRO 100 UNIT/ML IJ SOLN
100.0000 [IU] | Freq: Every day | INTRAMUSCULAR | 5 refills | Status: AC
  Filled 2024-05-18: qty 30, 30d supply, fill #0

## 2024-05-18 MED ORDER — OMNIPOD 5 G7 PODS (GEN 5) MISC
1.0000 | 5 refills | Status: AC
  Filled 2024-05-18 – 2024-06-01 (×2): qty 10, 30d supply, fill #0

## 2024-05-18 MED ORDER — OMNIPOD 5 DEXG7G6 INTRO GEN 5 KIT
PACK | 0 refills | Status: AC
  Filled 2024-05-18: qty 1, 15d supply, fill #0

## 2024-05-19 ENCOUNTER — Other Ambulatory Visit: Payer: Self-pay

## 2024-06-01 ENCOUNTER — Other Ambulatory Visit: Payer: Self-pay

## 2024-06-01 ENCOUNTER — Other Ambulatory Visit (HOSPITAL_COMMUNITY): Payer: Self-pay

## 2024-06-01 MED ORDER — JANUMET XR 50-1000 MG PO TB24
1.0000 | ORAL_TABLET | Freq: Two times a day (BID) | ORAL | 11 refills | Status: AC
  Filled 2024-06-01: qty 180, 90d supply, fill #0
  Filled 2024-06-05: qty 60, 30d supply, fill #0

## 2024-06-02 ENCOUNTER — Other Ambulatory Visit: Payer: Self-pay

## 2024-06-05 ENCOUNTER — Other Ambulatory Visit (HOSPITAL_COMMUNITY): Payer: Self-pay

## 2024-06-06 ENCOUNTER — Encounter: Payer: Self-pay | Admitting: Pharmacy Technician

## 2024-06-06 ENCOUNTER — Other Ambulatory Visit: Payer: Self-pay

## 2024-06-06 ENCOUNTER — Other Ambulatory Visit (HOSPITAL_COMMUNITY): Payer: Self-pay

## 2024-06-06 MED ORDER — DEXCOM G7 SENSOR MISC
1 refills | Status: AC
  Filled 2024-06-06 – 2024-06-09 (×2): qty 9, 90d supply, fill #0

## 2024-06-06 MED ORDER — JANUMET XR 50-1000 MG PO TB24
1.0000 | ORAL_TABLET | Freq: Two times a day (BID) | ORAL | 11 refills | Status: AC
  Filled 2024-06-06: qty 180, 90d supply, fill #0

## 2024-06-07 ENCOUNTER — Other Ambulatory Visit: Payer: Self-pay

## 2024-06-07 ENCOUNTER — Other Ambulatory Visit (HOSPITAL_COMMUNITY): Payer: Self-pay

## 2024-06-07 ENCOUNTER — Ambulatory Visit: Payer: Self-pay | Admitting: Family Medicine

## 2024-06-07 ENCOUNTER — Other Ambulatory Visit: Payer: Self-pay | Admitting: Family Medicine

## 2024-06-07 ENCOUNTER — Encounter: Payer: Self-pay | Admitting: Family Medicine

## 2024-06-07 MED ORDER — OXYCODONE HCL 5 MG PO TABS
5.0000 mg | ORAL_TABLET | Freq: Two times a day (BID) | ORAL | 0 refills | Status: AC | PRN
  Filled 2024-06-07: qty 32, 16d supply, fill #0
  Filled 2024-06-09: qty 32, 28d supply, fill #0

## 2024-06-09 ENCOUNTER — Other Ambulatory Visit (HOSPITAL_COMMUNITY): Payer: Self-pay

## 2024-06-09 ENCOUNTER — Other Ambulatory Visit: Payer: Self-pay

## 2024-06-28 ENCOUNTER — Ambulatory Visit: Payer: Self-pay | Admitting: Family Medicine
